# Patient Record
Sex: Female | Born: 1995
Health system: Southern US, Community
[De-identification: ages and names within clinical notes are randomized; demographics above are authoritative.]

## PROBLEM LIST (undated history)

## (undated) DIAGNOSIS — F329 Major depressive disorder, single episode, unspecified: Secondary | ICD-10-CM

## (undated) DIAGNOSIS — K219 Gastro-esophageal reflux disease without esophagitis: Secondary | ICD-10-CM

## (undated) DIAGNOSIS — F32A Depression, unspecified: Secondary | ICD-10-CM

## (undated) DIAGNOSIS — F419 Anxiety disorder, unspecified: Secondary | ICD-10-CM

## (undated) DIAGNOSIS — T7840XA Allergy, unspecified, initial encounter: Secondary | ICD-10-CM

## (undated) HISTORY — PX: WISDOM TOOTH EXTRACTION: SHX21

## (undated) HISTORY — PX: TONSILLECTOMY: SUR1361

## (undated) HISTORY — DX: Gastro-esophageal reflux disease without esophagitis: K21.9

## (undated) HISTORY — DX: Depression, unspecified: F32.A

## (undated) HISTORY — DX: Allergy, unspecified, initial encounter: T78.40XA

## (undated) HISTORY — DX: Anxiety disorder, unspecified: F41.9

## (undated) HISTORY — DX: Major depressive disorder, single episode, unspecified: F32.9

---
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
## 2015-03-12 ENCOUNTER — Emergency Department
Admission: EM | Admit: 2015-03-12 | Discharge: 2015-03-12 | Disposition: A | Discharge status: Home / Self Care | Payer: Managed Care, Other (non HMO) | Attending: Emergency Medicine | Admitting: Emergency Medicine

## 2015-03-12 ENCOUNTER — Encounter: Payer: Self-pay | Admitting: Emergency Medicine

## 2015-03-12 DIAGNOSIS — J029 Acute pharyngitis, unspecified: Secondary | ICD-10-CM | POA: Insufficient documentation

## 2015-03-12 DIAGNOSIS — Z87891 Personal history of nicotine dependence: Secondary | ICD-10-CM | POA: Insufficient documentation

## 2015-03-12 DIAGNOSIS — J040 Acute laryngitis: Secondary | ICD-10-CM | POA: Diagnosis not present

## 2015-03-12 DIAGNOSIS — Z880 Allergy status to penicillin: Secondary | ICD-10-CM | POA: Diagnosis not present

## 2015-03-12 DIAGNOSIS — B349 Viral infection, unspecified: Secondary | ICD-10-CM | POA: Diagnosis not present

## 2015-03-12 LAB — POCT RAPID STREP A
Streptococcus, Group A Screen (Direct): NEGATIVE
Streptococcus, Group A Screen (Direct): NEGATIVE

## 2015-03-12 MED ORDER — LIDOCAINE VISCOUS 2 % MT SOLN: 5.0000 mL | Freq: Four times a day (QID) | OROMUCOSAL | Status: DC | PRN

## 2015-03-12 MED ORDER — LIDOCAINE VISCOUS 2 % MT SOLN
15.0000 mL | Freq: Once | OROMUCOSAL | Status: AC
  Administered 2015-03-12: 15 mL via OROMUCOSAL
  Filled 2015-03-12: qty 15

## 2015-03-12 MED ORDER — METHYLPREDNISOLONE 4 MG PO TBPK: ORAL_TABLET | ORAL | Status: DC

## 2015-03-12 MED ORDER — PSEUDOEPH-BROMPHEN-DM 30-2-10 MG/5ML PO SYRP: 5.0000 mL | ORAL_SOLUTION | Freq: Four times a day (QID) | ORAL | Status: DC | PRN

## 2015-03-12 MED ORDER — ONDANSETRON 8 MG PO TBDP
8.0000 mg | ORAL_TABLET | Freq: Once | ORAL | Status: AC
  Administered 2015-03-12: 8 mg via ORAL
  Filled 2015-03-12: qty 1

## 2015-03-12 MED ORDER — DIPHENHYDRAMINE HCL 12.5 MG/5ML PO ELIX
25.0000 mg | ORAL_SOLUTION | Freq: Once | ORAL | Status: AC
  Administered 2015-03-12: 25 mg via ORAL
  Filled 2015-03-12: qty 10

## 2015-03-12 MED ORDER — ONDANSETRON 8 MG PO TBDP: 8.0000 mg | ORAL_TABLET | Freq: Three times a day (TID) | ORAL | Status: DC | PRN

## 2015-03-12 NOTE — ED Notes (Signed)
Sore throat with some fever for the past 3 days

## 2015-03-12 NOTE — Discharge Instructions (Signed)
Pharyngitis Pharyngitis is redness, pain, and swelling (inflammation) of your pharynx.  CAUSES  Pharyngitis is usually caused by infection. Most of the time, these infections are from viruses (viral) and are part of a cold. However, sometimes pharyngitis is caused by bacteria (bacterial). Pharyngitis can also be caused by allergies. Viral pharyngitis may be spread from person to person by coughing, sneezing, and personal items or utensils (cups, forks, spoons, toothbrushes). Bacterial pharyngitis may be spread from person to person by more intimate contact, such as kissing.  SIGNS AND SYMPTOMS  Symptoms of pharyngitis include:   Sore throat.   Tiredness (fatigue).   Low-grade fever.   Headache.  Joint pain and muscle aches.  Skin rashes.  Swollen lymph nodes.  Plaque-like film on throat or tonsils (often seen with bacterial pharyngitis). DIAGNOSIS  Your health care provider will ask you questions about your illness and your symptoms. Your medical history, along with a physical exam, is often all that is needed to diagnose pharyngitis. Sometimes, a rapid strep test is done. Other lab tests may also be done, depending on the suspected cause.  TREATMENT  Viral pharyngitis will usually get better in 3-4 days without the use of medicine. Bacterial pharyngitis is treated with medicines that kill germs (antibiotics).  HOME CARE INSTRUCTIONS   Drink enough water and fluids to keep your urine clear or pale yellow.   Only take over-the-counter or prescription medicines as directed by your health care provider:   If you are prescribed antibiotics, make sure you finish them even if you start to feel better.   Do not take aspirin.   Get lots of rest.   Gargle with 8 oz of salt water ( tsp of salt per 1 qt of water) as often as every 1-2 hours to soothe your throat.   Throat lozenges (if you are not at risk for choking) or sprays may be used to soothe your throat. SEEK MEDICAL  CARE IF:   You have large, tender lumps in your neck.  You have a rash.  You cough up green, yellow-brown, or bloody spit. SEEK IMMEDIATE MEDICAL CARE IF:   Your neck becomes stiff.  You drool or are unable to swallow liquids.  You vomit or are unable to keep medicines or liquids down.  You have severe pain that does not go away with the use of recommended medicines.  You have trouble breathing (not caused by a stuffy nose). MAKE SURE YOU:   Understand these instructions.  Will watch your condition.  Will get help right away if you are not doing well or get worse.   This information is not intended to replace advice given to you by your health care provider. Make sure you discuss any questions you have with your health care provider.   Document Released: 02/17/2005 Document Revised: 12/08/2012 Document Reviewed: 10/25/2012 Elsevier Interactive Patient Education 2016 Else[employer].  Laryngitis Laryngitis is swelling (inflammation) of your vocal cords. This causes hoarseness, coughing, loss of voice, sore throat, or a dry throat. When your vocal cords are inflamed, your voice sounds different. Laryngitis can be temporary (acute) or long-term (chronic). Most cases of acute laryngitis improve with time. Chronic laryngitis is laryngitis that lasts for more than three weeks. HOME CARE  Drink enough fluid to keep your pee (urine) clear or pale yellow.  Breathe in moist air. Use a humidifier if you live in a dry climate.  Take medicines only as told by your doctor.  Do not smoke cigarettes or  electronic cigarettes. If you need help quitting, ask your doctor.  Talk as little as possible. Also avoid whispering, which can cause vocal strain.  Write instead of talking. Do this until your voice is back to normal. GET HELP IF:  You have a fever.  Your pain is worse.  You have trouble swallowing. GET HELP RIGHT AWAY IF:  You cough up blood.  You have trouble breathing.     This information is not intended to replace advice given to you by your health care provider. Make sure you discuss any questions you have with your health care provider.   Document Released: 02/06/2011 Document Revised: 03/10/2014 Document Reviewed: 08/02/2013 Elsevier Interactive Patient Education [employer].

## 2015-03-12 NOTE — ED Provider Notes (Signed)
[facility] Emergency Department Provider Note  ____________________________________________  Time seen: Approximately 4:08 PM  I have reviewed the triage vital signs and the nursing notes.   HISTORY  Chief Complaint Fever and Sore Throat    HPI Amber Wilson is a 20 y.o. female complaining of sore throat and fever. Patient states fever started 3 days ago in the sore throat started 2 days ago. Patient state yesterday she started losing her voice. Patient also complaining of sinus congestion and postnasal drainage. Patient denies any nausea vomiting diarrhea. Patient rates the pain discomfort as 7/10. Patient stated pain increases swallowing but she can tolerate food and fluids.   History reviewed. No pertinent past medical history.  There are no active problems to display for this patient.   History reviewed. No pertinent past surgical history.  Current Outpatient Rx  Name  Route  Sig  Dispense  Refill  . brompheniramine-pseudoephedrine-DM 30-2-10 MG/5ML syrup   Oral   Take 5 mLs by mouth 4 (four) times daily as needed. Mixed with 5 mL of viscous lidocaine for swish and swallow.   120 mL   0   . lidocaine (XYLOCAINE) 2 % solution   Mouth/Throat   Use as directed 5 mLs in the mouth or throat every 6 (six) hours as needed for mouth pain. Mixed with 5 mL of Bromfed-DM for swish and swallow.   100 mL   0     Allergies Penicillins  No family history on file.  Social History Social History  Substance Use Topics  . Smoking status: Former Research scientist (life sciences)  . Smokeless tobacco: None  . Alcohol Use: No    Review of Systems Constitutional: No fever/chills Eyes: No visual changes. ENT: No sore throat. Cardiovascular: Denies chest pain. Respiratory: Denies shortness of breath. Gastrointestinal: No abdominal pain.  No nausea, no vomiting.  No diarrhea.  No constipation. Genitourinary: Negative for dysuria. Musculoskeletal: Negative for back  pain. Skin: Negative for rash. Neurological: Negative for headaches, focal weakness or numbness. Allergic/Immunilogical: Patient lists penicillin  10-point ROS otherwise negative.  ____________________________________________   PHYSICAL EXAM:  VITAL SIGNS: ED Triage Vitals  Enc Vitals Group     BP 03/12/15 1514 122/69 mmHg     Pulse Rate 03/12/15 1514 111     Resp 03/12/15 1514 18     Temp 03/12/15 1514 97.7 F (36.5 C)     Temp Source 03/12/15 1514 Oral     SpO2 03/12/15 1514 99 %     Weight 03/12/15 1514 140 lb (63.504 kg)     Height 03/12/15 1514 5\' 2"  (1.575 m)     Head Cir --      Peak Flow --      Pain Score 03/12/15 1418 7     Pain Loc --      Pain Edu? --      Excl. in [place]? --     Constitutional: Alert and oriented. Well appearing and in no acute distress. Eyes: Conjunctivae are normal. PERRL. EOMI. Head: Atraumatic. Nose: No congestion/rhinnorhea. Mouth/Throat: Mucous membranes are moist.  Oropharynx erythematous without exudative tonsils. Neck: No stridor.  No cervical spine tenderness to palpation. Hematological/Lymphatic/Immunilogical: No cervical lymphadenopathy. Cardiovascular: Normal rate, regular rhythm. Grossly normal heart sounds.  Good peripheral circulation. Respiratory: Normal respiratory effort.  No retractions. Lungs CTAB. Gastrointestinal: Soft and nontender. No distention. No abdominal bruits. No CVA tenderness. Musculoskeletal: No lower extremity tenderness nor edema.  No joint effusions. Neurologic:  Normal speech and language. No gross  focal neurologic deficits are appreciated. No gait instability. Skin:  Skin is warm, dry and intact. No rash noted. Psychiatric: Mood and affect are normal. Speech and behavior are normal.  ____________________________________________   LABS (all labs ordered are listed, but only abnormal results are displayed)  Labs Reviewed  POCT RAPID STREP A    ____________________________________________  EKG   ____________________________________________  RADIOLOGY   ____________________________________________   PROCEDURES  Procedure(s) performed: None  Critical Care performed: No  ____________________________________________   INITIAL IMPRESSION / ASSESSMENT AND PLAN / ED COURSE  Pertinent labs & imaging results that were available during my care of the patient were reviewed by me and considered in my medical decision making (see chart for details).  Acute pharyngitis and laryngitis. Patient given discharge care instructions. Patient given prescription viscous lidocaine) felt DM. Advised to follow-up with "clinic if condition persists. ____________________________________________   FINAL CLINICAL IMPRESSION(S) / ED DIAGNOSES  Final diagnoses:  Pharyngitis with viral syndrome  Laryngitis, acute      Sable Feil, PA-C 03/12/15 1621

## 2015-03-12 NOTE — ED Notes (Signed)
Pt to ed with c/o sore throat and fever x 3 days.

## 2016-08-29 ENCOUNTER — Ambulatory Visit: Payer: Self-pay | Admitting: Advanced Practice Midwife

## 2016-09-19 ENCOUNTER — Encounter: Payer: Self-pay | Admitting: Advanced Practice Midwife

## 2016-09-19 ENCOUNTER — Ambulatory Visit (INDEPENDENT_AMBULATORY_CARE_PROVIDER_SITE_OTHER): Payer: Commercial Managed Care - PPO | Admitting: Advanced Practice Midwife

## 2016-09-19 VITALS — BP 114/70 | Ht 63.0 in | Wt 127.0 lb

## 2016-09-19 DIAGNOSIS — Z30011 Encounter for initial prescription of contraceptive pills: Secondary | ICD-10-CM

## 2016-09-19 DIAGNOSIS — Z124 Encounter for screening for malignant neoplasm of cervix: Secondary | ICD-10-CM | POA: Diagnosis not present

## 2016-09-19 DIAGNOSIS — Z01419 Encounter for gynecological examination (general) (routine) without abnormal findings: Secondary | ICD-10-CM | POA: Diagnosis not present

## 2016-09-19 LAB — HM PAP SMEAR: HM Pap smear: NEGATIVE

## 2016-09-19 MED ORDER — NORETHIN ACE-ETH ESTRAD-FE 1-20 MG-MCG PO TABS: 1.0000 | ORAL_TABLET | Freq: Every day | ORAL | 11 refills | Status: DC

## 2016-09-19 NOTE — Progress Notes (Signed)
Patient ID: Amber Wilson, female   DOB: 31-Oct-1995, 21 y.o.   MRN: [id number]     Gynecology Annual Exam  PCP: Patient, No Pcp Per  Chief Complaint:  Chief Complaint  Patient presents with  . Annual Exam    History of Present Illness: Patient is a 21 y.o. G0P0000 presents for annual exam. The patient has complaints today of being overdue for Nexplanon removal and she is requesting pills for birth control. She has a mole on her left breast that was previously examined by Dr Dear of [facility] in 2013. Dr Dear described the mole as 34mm, dark brown and evenly pigmented.   LMP: 08/25/2016 Menarche:not applicable Average Interval: has had monthly light bleeding for the last few months Duration of flow: 5 days Heavy Menses: no Clots: no Intermenstrual Bleeding: no Postcoital Bleeding: no Dysmenorrhea: no  The patient is sexually active. She currently uses Nexplanon for contraception. She denies dyspareunia.  The patient does perform self breast exams.  There is no notable family history of breast or ovarian cancer in her family.  The patient wears seatbelts: yes.  The patient has regular exercise: yes.    The patient denies current symptoms of depression.    Review of Systems: Review of Systems  Constitutional: Negative.   HENT: Negative.   Eyes: Negative.   Respiratory: Negative.   Cardiovascular: Negative.        Patient admits cystic breast tissue. Possible color change in mole on left breast  Gastrointestinal: Negative.   Genitourinary: Negative.   Musculoskeletal: Negative.   Skin: Negative.   Neurological: Negative.   Endo/Heme/Allergies: Negative.   Psychiatric/Behavioral: Negative.     Past Medical History:  Past Medical History:  Diagnosis Date  . Depression     Past Surgical History:  Past Surgical History:  Procedure Laterality Date  . TONSILLECTOMY    . WISDOM TOOTH EXTRACTION      Gynecologic History:  No LMP recorded. Contraception:  Nexplanon Last Pap: Results were: has never had a PAP    Obstetric History: G0P0000  Family History:  History reviewed. No pertinent family history.  Social History:  Social History   Social History  . Marital status: Single    Spouse name: N/A  . Number of children: N/A  . Years of education: N/A   Occupational History  . Not on file.   Social History Main Topics  . Smoking status: Former Research scientist (life sciences)  . Smokeless tobacco: Never Used  . Alcohol use No  . Drug use: No  . Sexual activity: Yes    Birth control/ protection: Implant   Other Topics Concern  . Not on file   Social History Narrative  . No narrative on file    Allergies:  Allergies  Allergen Reactions  . Penicillins     Medications: Prior to Admission medications   Medication Sig Start Date End Date Taking? Authorizing Provider  etonogestrel (NEXPLANON) 68 MG IMPL implant 1 each by Subdermal route once.   Yes [provider]  brompheniramine-pseudoephedrine-DM 30-2-10 MG/5ML syrup Take 5 mLs by mouth 4 (four) times daily as needed. Mixed with 5 mL of viscous lidocaine for swish and swallow. Patient not taking: Reported on 09/19/2016 03/12/15   Sable Feil, PA-C  lidocaine (XYLOCAINE) 2 % solution Use as directed 5 mLs in the mouth or throat every 6 (six) hours as needed for mouth pain. Mixed with 5 mL of Bromfed-DM for swish and swallow. Patient not taking: Reported on 09/19/2016 03/12/15  Sable Feil, PA-C  methylPREDNISolone (MEDROL DOSEPAK) 4 MG TBPK tablet Take Tapered dose as directed Patient not taking: Reported on 09/19/2016 03/12/15   Sable Feil, PA-C  ondansetron (ZOFRAN ODT) 8 MG disintegrating tablet Take 1 tablet (8 mg total) by mouth every 8 (eight) hours as needed for nausea or vomiting. Patient not taking: Reported on 09/19/2016 03/12/15   Sable Feil, PA-C    Physical Exam Vitals: Blood pressure 114/70, height 5\' 3"  (1.6 m), weight 127 lb (57.6 kg).  General: NAD HEENT:  normocephalic, anicteric Thyroid: no enlargement, no palpable nodules Pulmonary: No increased work of breathing, CTAB Cardiovascular: RRR, distal pulses 2+ Breast: Breast symmetrical, no tenderness, 1 cm cyst palpated in each breast, no skin or nipple retraction present, no nipple discharge.  No axillary or supraclavicular lymphadenopathy. Nevus on left breast at 10 o'clock next to areola with irregular border is slightly longer than wide approximately 5 mm x 7 mm. The exterior is light brown and the interior a darker brown. Abdomen: NABS, soft, non-tender, non-distended.  Umbilicus without lesions.  No hepatomegaly, splenomegaly or masses palpable. No evidence of hernia  Genitourinary:  External: Normal external female genitalia.  Normal urethral meatus, normal  Bartholin's and Skene's glands.    Vagina: Normal vaginal mucosa, no evidence of prolapse.    Cervix: Grossly normal in appearance, no bleeding, no CMT  Uterus: Non-enlarged, mobile, normal contour.    Adnexa: ovaries non-enlarged, no adnexal masses  Rectal: deferred  Lymphatic: no evidence of inguinal lymphadenopathy Extremities: no edema, erythema, or tenderness Neurologic: Grossly intact Psychiatric: mood appropriate, affect full   Assessment: 21 y.o. G0P0000 Well Woman exam with PAP smear  Plan: Problem List Items Addressed This Visit    None    Visit Diagnoses    Well woman exam with routine gynecological exam    -  Primary   Relevant Orders   IGP,rfx Aptima HPV all pth   Cervical cancer screening       Relevant Orders   IGP,rfx Aptima HPV all pth      1) 4) Gardasil Series discussed and if applicable offered to patient - Patient has not previously completed 3 shot series   2) STI screening was offered and declined  3) ASCCP guidelines and rational discussed.  Patient opts for yearly screening interval  4) Contraception - Removal of Nexplanon today and initiate OCP  5) Follow up 1 year for routine annual  exam  Rod Can, CNM      GYNECOLOGY PROCEDURE NOTE  Nexplanon removal discussed in detail.  Risks of infection, bleeding, nerve injury all reviewed.  Patient understands risks and desires to proceed.  Verbal consent obtained.  Patient is certain she wants the implanon removed.  All questions answered.  Procedure: Patient placed in dorsal supine with left arm above head, elbow flexed at 90 degrees, arm resting on examination table.  Nexplanon identified without problems.  Betadine scrub x3.  1 ml of 1% lidocaine injected under Nexplanon device without problems.  Sterile gloves applied.  Small 0.5cm incision made at distal tip of Nexplanon device with 11 blade scalpel.  Nexplanon brought to incision and grasped with a small kelly clamp.  Nexplanon removed intact without problems.  Pressure applied to incision.  Hemostasis obtained.  Steri-strips applied, followed by bandage and compression dressing.  Patient tolerated procedure well.  No complications.   Assessment: 21 y.o. year old female now s/p uncomplicated Nexplanon removal.  Plan: 1.  Patient given post procedure precautions  and asked to call for fever, chills, redness or drainage from her incision, bleeding from incision.  She understands she will likely have a small bruise near site of removal and can remove bandage tomorrow and steri-strips in approximately 1 week.  2) Contraception: OCPs  Rod Can, CNM

## 2016-09-23 LAB — IGP,RFX APTIMA HPV ALL PTH: PAP Smear Comment: 0

## 2017-08-19 ENCOUNTER — Other Ambulatory Visit: Payer: Self-pay | Admitting: Advanced Practice Midwife

## 2017-08-19 DIAGNOSIS — Z30011 Encounter for initial prescription of contraceptive pills: Secondary | ICD-10-CM

## 2017-10-10 ENCOUNTER — Other Ambulatory Visit: Payer: Self-pay | Admitting: Advanced Practice Midwife

## 2017-10-10 DIAGNOSIS — Z30011 Encounter for initial prescription of contraceptive pills: Secondary | ICD-10-CM

## 2017-10-12 ENCOUNTER — Other Ambulatory Visit: Payer: Self-pay

## 2017-10-12 DIAGNOSIS — Z30011 Encounter for initial prescription of contraceptive pills: Secondary | ICD-10-CM

## 2017-10-12 MED ORDER — NORETHIN ACE-ETH ESTRAD-FE 1-20 MG-MCG PO TABS: 1.0000 | ORAL_TABLET | Freq: Every day | ORAL | 0 refills | Status: DC

## 2017-10-12 NOTE — Telephone Encounter (Signed)
Pt has annual scheduled for 8/30; needs refill of bc sent to [facility].  [phone]  Pt aware eRx'd.

## 2017-10-30 ENCOUNTER — Ambulatory Visit (INDEPENDENT_AMBULATORY_CARE_PROVIDER_SITE_OTHER): Payer: Commercial Managed Care - PPO | Admitting: Advanced Practice Midwife

## 2017-10-30 ENCOUNTER — Encounter

## 2017-10-30 ENCOUNTER — Encounter: Payer: Self-pay | Admitting: Advanced Practice Midwife

## 2017-10-30 VITALS — BP 102/74 | HR 53 | Ht 63.0 in | Wt 127.0 lb

## 2017-10-30 DIAGNOSIS — Z0000 Encounter for general adult medical examination without abnormal findings: Secondary | ICD-10-CM

## 2017-10-30 DIAGNOSIS — Z30011 Encounter for initial prescription of contraceptive pills: Secondary | ICD-10-CM | POA: Diagnosis not present

## 2017-10-30 MED ORDER — NORETHIN ACE-ETH ESTRAD-FE 1-20 MG-MCG PO TABS: 1.0000 | ORAL_TABLET | Freq: Every day | ORAL | 11 refills | Status: DC

## 2017-10-30 NOTE — Patient Instructions (Signed)

## 2017-10-30 NOTE — Progress Notes (Signed)
Patient ID: Amber Wilson, female   DOB: Nov 10, 1995, 22 y.o.   MRN: [id number]     Gynecology Annual Exam  PCP: Patient, No Pcp Per  Chief Complaint:  Chief Complaint  Patient presents with  . Annual Exam    last pap 08/2016    History of Present Illness: Patient is a 22 y.o. G0P0000 presents for annual exam. The patient has complaint today of recent left breast tenderness. She feels a possible cyst in the tender area. She also wonders if the mole on her left breast has changed from previous visit.    LMP: Patient's last menstrual period was 10/11/2017. Menarche:not applicable Average Interval: regular, 28 days Duration of flow: 5 days Heavy Menses: no Clots: no Intermenstrual Bleeding: no Postcoital Bleeding: no Dysmenorrhea: no  The patient is sexually active. She currently uses OCP (estrogen/progesterone) for contraception. She denies dyspareunia.  The patient does perform self breast exams.  There is no notable family history of breast or ovarian cancer in her family.  The patient wears seatbelts: yes.  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    The patient denies current symptoms of depression.    Review of Systems: Review of Systems  Constitutional: Negative.   HENT: Negative.   Eyes: Negative.   Respiratory: Negative.   Cardiovascular:       Breast tenderness  Gastrointestinal: Negative.   Genitourinary: Negative.   Musculoskeletal: Negative.   Skin: Negative.   Neurological: Negative.   Endo/Heme/Allergies:       Seasonal allergies  Psychiatric/Behavioral:       Anxiety    Past Medical History:  Past Medical History:  Diagnosis Date  . Depression     Past Surgical History:  Past Surgical History:  Procedure Laterality Date  . TONSILLECTOMY    . WISDOM TOOTH EXTRACTION      Gynecologic History:  Patient's last menstrual period  was 10/11/2017. Contraception: OCP (estrogen/progesterone) Last Pap: 1 year ago Results were:  no abnormalities   Obstetric History: G0P0000  Family History:  No family history on file.  Social History:  Social History   Socioeconomic History  . Marital status: Single    Spouse name: Not on file  . Number of children: Not on file  . Years of education: Not on file  . Highest education level: Not on file  Occupational History  . Not on file  Social Needs  . Financial resource strain: Not on file  . Food insecurity:    Worry: Not on file    Inability: Not on file  . Transportation needs:    Medical: Not on file    Non-medical: Not on file  Tobacco Use  . Smoking status: Former Research scientist (life sciences)  . Smokeless tobacco: Never Used  Substance and Sexual Activity  . Alcohol use: No  . Drug use: No  . Sexual activity: Yes    Birth control/protection: Implant  Lifestyle  . Physical activity:    Days per week: Not on file    Minutes per session: Not on file  . Stress: Not on file  Relationships  . Social connections:    Talks on phone: Not on file    Gets together: Not on file    Attends religious service: Not on file    Active member of club or organization: Not on file    Attends meetings of clubs  or organizations: Not on file    Relationship status: Not on file  . Intimate partner violence:    Fear of current or ex partner: Not on file    Emotionally abused: Not on file    Physically abused: Not on file    Forced sexual activity: Not on file  Other Topics Concern  . Not on file  Social History Narrative  . Not on file    Allergies:  Allergies  Allergen Reactions  . Penicillins     Medications: Prior to Admission medications   Medication Sig Start Date End Date Taking? Authorizing Provider  norethindrone-ethinyl estradiol (JUNEL FE 1/20) 1-20 MG-MCG tablet Take 1 tablet by mouth daily. 10/30/17  Yes Rod Can, CNM    Physical Exam Vitals: Blood pressure  102/74, pulse (!) 53, height 5\' 3"  (1.6 m), weight 127 lb (57.6 kg), last menstrual period 10/11/2017, SpO2 95 %.  General: NAD HEENT: normocephalic, anicteric Thyroid: no enlargement, no palpable nodules Pulmonary: No increased work of breathing, CTAB Cardiovascular: RRR, distal pulses 2+ Breast: Breast symmetrical, no tenderness of masses palpated on right breast, tenderness on outer quadrant of left breast with palpated nodule at 3 o'clock which feels like a cyst, no skin or nipple retraction present, no nipple discharge.  No axillary or supraclavicular lymphadenopathy. Mole on left breast continues to be 5 mm and dark brown in color with slightly irregular border especially at superior edge Abdomen: NABS, soft, non-tender, non-distended.  Umbilicus without lesions.  No hepatomegaly, splenomegaly or masses palpable. No evidence of hernia  Genitourinary: deferred for no concerns/PAP interval/shared decision making Extremities: no edema, erythema, or tenderness Neurologic: Grossly intact Psychiatric: mood appropriate, affect full   Assessment: 22 y.o. G0P0000 routine annual exam  Plan: Problem List Items Addressed This Visit    None    Visit Diagnoses    Well woman exam without gynecological exam    -  Primary   Encounter for initial prescription of contraceptive pills       Relevant Medications   norethindrone-ethinyl estradiol (JUNEL FE 1/20) 1-20 MG-MCG tablet      1) 4) Gardasil Series discussed and if applicable offered to patient - Patient has not previously completed 3 shot series   2) STI screening  was offered and declined   3) Recommend visit to dermatologist for evaluation of mole out of abundance of caution  4) Offered ultrasound of left breast to evaluate "cystic" area. She prefers to wait a month and see if cyst/tenderness goes away and she will let me know if she wants ultrasound  5)  ASCCP guidelines and rational discussed.  Patient opts for every 3 years  screening interval  6) Contraception - the patient is currently using  OCP (estrogen/progesterone).  She is happy with her current form of contraception and plans to continue We discussed safe sex practices to reduce her furture risk of STI's.    7) Return in 1 year (on 10/31/2018).    Rod Can, [facility], [facility] 10/30/2017, 4:17 PM

## 2018-03-09 DIAGNOSIS — Z111 Encounter for screening for respiratory tuberculosis: Secondary | ICD-10-CM | POA: Diagnosis not present

## 2018-03-11 DIAGNOSIS — Z111 Encounter for screening for respiratory tuberculosis: Secondary | ICD-10-CM | POA: Diagnosis not present

## 2018-05-05 ENCOUNTER — Other Ambulatory Visit: Payer: Self-pay

## 2018-05-05 ENCOUNTER — Ambulatory Visit
Admission: EM | Admit: 2018-05-05 | Discharge: 2018-05-05 | Disposition: A | Discharge status: Home / Self Care | Payer: Commercial Managed Care - PPO | Attending: Family Medicine | Admitting: Family Medicine

## 2018-05-05 DIAGNOSIS — J029 Acute pharyngitis, unspecified: Secondary | ICD-10-CM | POA: Diagnosis not present

## 2018-05-05 LAB — RAPID STREP SCREEN (MED CTR MEBANE ONLY): Streptococcus, Group A Screen (Direct): NEGATIVE

## 2018-05-05 NOTE — ED Triage Notes (Signed)
Patient complains of sore throat that started yesterday with swollen lymph nodes and painful swallowing.

## 2018-05-05 NOTE — ED Provider Notes (Signed)
[facility] ____________________________________________  Time seen: Approximately 12:22 PM  I have reviewed the triage vital signs and the nursing notes.   HISTORY  Chief Complaint Sore Throat   HPI Amber Wilson is a 23 y.o. female presenting for evaluation of sore throat since yesterday.  States sore throat is mild to moderate.  States painful to swallow.  Denies nasal congestion or nasal drainage.  Some coughing, and clearing throat.  Denies fevers.  States does feel like previous strep throat.  Denies known fevers.  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  Denies chest pain, shortness of breath or abdominal pain.  States that she also a few days ago ate a hot panini and thinks that she cut underneath her tongue and wanted to be checked.  States the cough drops over-the-counter helping some.  Denies other relieving factors.  Reports otherwise doing well.   Past Medical History:  Diagnosis Date  . Depression     There are no active problems to display for this patient.   Past Surgical History:  Procedure Laterality Date  . TONSILLECTOMY    . WISDOM TOOTH EXTRACTION       No current facility-administered medications for this encounter.   Current Outpatient Medications:  .  norethindrone-ethinyl estradiol (JUNEL FE 1/20) 1-20 MG-MCG tablet, Take 1 tablet by mouth daily., Disp: 28 tablet, Rfl: 11  Allergies Penicillins  History reviewed. No pertinent family history.  Social History Social History   Tobacco Use  . Smoking status: Former Research scientist (life sciences)  . Smokeless tobacco: Never Used  Substance Use Topics  . Alcohol use: No  . Drug use: No    Review of Systems Constitutional: No fever ENT: positive sore throat. Cardiovascular: Denies chest pain. Respiratory: Denies shortness of breath. Gastrointestinal: No abdominal pain.   Musculoskeletal: Negative for back pain. Skin: Negative for  rash.   ____________________________________________   PHYSICAL EXAM:  VITAL SIGNS: ED Triage Vitals  Enc Vitals Group     BP 05/05/18 1140 122/79     Pulse Rate 05/05/18 1140 88     Resp 05/05/18 1140 16     Temp 05/05/18 1140 98.8 F (37.1 C)     Temp Source 05/05/18 1140 Oral     SpO2 05/05/18 1140 99 %     Weight 05/05/18 1135 125 lb (56.7 kg)     Height 05/05/18 1135 5\' 3"  (1.6 m)     Head Circumference --      Peak Flow --      Pain Score 05/05/18 1134 7     Pain Loc --      Pain Edu? --      Excl. in [place]? --     Constitutional: Alert and oriented. Well appearing and in no acute distress. Eyes: Conjunctivae are normal.  Head: Atraumatic. No sinus tenderness to palpation. No swelling. No erythema.  Ears: no erythema, normal TMs bilaterally.   Nose:No nasal congestion   Mouth/Throat: Mucous membranes are moist. Mild pharyngeal erythema.  Previous tonsillectomy.  No exudate.  Underneath left side of tongue less than 0.5 cm aphthous appearing ulcer, no surrounding erythema. Neck: No stridor.  No cervical spine tenderness to palpation. Hematological/Lymphatic/Immunilogical: No cervical lymphadenopathy. Cardiovascular: Normal rate, regular rhythm. Grossly normal heart sounds.  Good peripheral circulation. Respiratory: Normal respiratory effort.  No retractions. No wheezes, rales or rhonchi. Good air movement.  Musculoskeletal: Ambulatory with steady gait.  Neurologic:  Normal speech and language. No gait instability. Skin:  Skin  appears warm, dry and intact. No rash noted. Psychiatric: Mood and affect are normal. Speech and behavior are normal. ___________________________________________   LABS (all labs ordered are listed, but only abnormal results are displayed)  Labs Reviewed  RAPID STREP SCREEN ([facility] ONLY)  CULTURE, GROUP A STREP [facility])    PROCEDURES   INITIAL IMPRESSION / ASSESSMENT AND PLAN / ED COURSE  Pertinent labs & imaging results that were  available during my care of the patient were reviewed by me and considered in my medical decision making (see chart for details).  Well-appearing patient.  No acute distress.  Strep negative, will culture.  Suspect viral pharyngitis.  Patient declined flu evaluation.  Aphthous ulcer appearance, encourage supportive care and monitoring.  Work note given for today and tomorrow as well as school note for today and tomorrow.  Continue over-the-counter lozenges and supportive care.  Discussed follow up with Primary care physician this week. Discussed follow up and return parameters including no resolution or any worsening concerns. Patient verbalized understanding and agreed to plan.   ____________________________________________   FINAL CLINICAL IMPRESSION(S) / ED DIAGNOSES  Final diagnoses:  Pharyngitis, unspecified etiology     ED Discharge Orders    None       Note: This dictation was prepared with Dragon dictation along with smaller phrase technology. Any transcriptional errors that result from this process are unintentional.         Marylene Land, NP 05/05/18 1226

## 2018-05-05 NOTE — Discharge Instructions (Addendum)
Over the counter as discussed. Rest. Drink plenty of fluids.   Follow up with your primary care physician this week as needed. Return to Urgent care for new or worsening concerns.

## 2018-05-07 ENCOUNTER — Ambulatory Visit
Admission: EM | Admit: 2018-05-07 | Discharge: 2018-05-07 | Disposition: A | Discharge status: Home / Self Care | Payer: Commercial Managed Care - PPO | Attending: Family Medicine | Admitting: Family Medicine

## 2018-05-07 ENCOUNTER — Encounter: Payer: Self-pay | Admitting: Emergency Medicine

## 2018-05-07 ENCOUNTER — Other Ambulatory Visit: Payer: Self-pay

## 2018-05-07 DIAGNOSIS — R5383 Other fatigue: Secondary | ICD-10-CM

## 2018-05-07 DIAGNOSIS — R05 Cough: Secondary | ICD-10-CM | POA: Diagnosis not present

## 2018-05-07 DIAGNOSIS — M791 Myalgia, unspecified site: Secondary | ICD-10-CM

## 2018-05-07 DIAGNOSIS — Z87891 Personal history of nicotine dependence: Secondary | ICD-10-CM

## 2018-05-07 DIAGNOSIS — R0981 Nasal congestion: Secondary | ICD-10-CM | POA: Diagnosis not present

## 2018-05-07 DIAGNOSIS — J111 Influenza due to unidentified influenza virus with other respiratory manifestations: Secondary | ICD-10-CM

## 2018-05-07 DIAGNOSIS — R0982 Postnasal drip: Secondary | ICD-10-CM

## 2018-05-07 DIAGNOSIS — R69 Illness, unspecified: Secondary | ICD-10-CM

## 2018-05-07 LAB — CULTURE, GROUP A STREP (THRC)

## 2018-05-07 MED ORDER — OSELTAMIVIR PHOSPHATE 75 MG PO CAPS: 75.0000 mg | ORAL_CAPSULE | Freq: Two times a day (BID) | ORAL | 0 refills | Status: DC

## 2018-05-07 MED ORDER — ONDANSETRON 8 MG PO TBDP: 8.0000 mg | ORAL_TABLET | Freq: Three times a day (TID) | ORAL | 0 refills | Status: DC | PRN

## 2018-05-07 NOTE — ED Provider Notes (Signed)
[facility]    CSN: [id number] Arrival date & time: 05/07/18  1540     History   Chief Complaint Chief Complaint  Patient presents with  . Sinus Problem  . Cough  . Fever    HPI Amber Wilson is a 23 y.o. female.   The history is provided by the patient.  Sinus Problem   Cough  Associated symptoms: fever, myalgias and rhinorrhea   Associated symptoms: no wheezing   Fever  Associated symptoms: congestion, cough, myalgias and rhinorrhea   URI  Presenting symptoms: congestion, cough, fatigue, fever and rhinorrhea   Severity:  Moderate Onset quality:  Sudden Duration:  2 days Timing:  Constant Progression:  Worsening Chronicity:  New Relieved by:  Nothing Ineffective treatments:  OTC medications Associated symptoms: myalgias   Associated symptoms: no wheezing   Risk factors: sick contacts     Past Medical History:  Diagnosis Date  . Depression     There are no active problems to display for this patient.   Past Surgical History:  Procedure Laterality Date  . TONSILLECTOMY    . WISDOM TOOTH EXTRACTION      OB History    Gravida  0   Para  0   Term  0   Preterm  0   AB  0   Living  0     SAB  0   TAB  0   Ectopic  0   Multiple  0   Live Births  0            Home Medications    Prior to Admission medications   Medication Sig Start Date End Date Taking? Authorizing Provider  norethindrone-ethinyl estradiol (JUNEL FE 1/20) 1-20 MG-MCG tablet Take 1 tablet by mouth daily. 10/30/17  Yes Rod Can, CNM  ondansetron (ZOFRAN ODT) 8 MG disintegrating tablet Take 1 tablet (8 mg total) by mouth every 8 (eight) hours as needed. 05/07/18   Norval Gable, MD  oseltamivir (TAMIFLU) 75 MG capsule Take 1 capsule (75 mg total) by mouth 2 (two) times daily. 05/07/18   Norval Gable, MD    Family History History reviewed. No pertinent family history.  Social History Social History   Tobacco Use  . Smoking status: Former  Research scientist (life sciences)  . Smokeless tobacco: Never Used  Substance Use Topics  . Alcohol use: No  . Drug use: No     Allergies   Penicillins   Review of Systems Review of Systems  Constitutional: Positive for fatigue and fever.  HENT: Positive for congestion and rhinorrhea.   Respiratory: Positive for cough. Negative for wheezing.   Musculoskeletal: Positive for myalgias.     Physical Exam Triage Vital Signs ED Triage Vitals  Enc Vitals Group     BP 05/07/18 1556 106/73     Pulse Rate 05/07/18 1556 (!) 112     Resp 05/07/18 1556 14     Temp 05/07/18 1556 98.8 F (37.1 C)     Temp Source 05/07/18 1556 Oral     SpO2 05/07/18 1556 99 %     Weight 05/07/18 1554 125 lb (56.7 kg)     Height 05/07/18 1554 5\' 3"  (1.6 m)     Head Circumference --      Peak Flow --      Pain Score 05/07/18 1554 6     Pain Loc --      Pain Edu? --      Excl. in [place]? --  No data found.  Updated Vital Signs BP 106/73 (BP Location: Left Arm)   Pulse (!) 112   Temp 98.8 F (37.1 C) (Oral)   Resp 14   Ht 5\' 3"  (1.6 m)   Wt 56.7 kg   LMP 04/27/2018   SpO2 99%   BMI 22.14 kg/m   Visual Acuity Right Eye Distance:   Left Eye Distance:   Bilateral Distance:    Right Eye Near:   Left Eye Near:    Bilateral Near:     Physical Exam Vitals signs and nursing note reviewed.  Constitutional:      General: She is not in acute distress.    Appearance: She is well-developed. She is not toxic-appearing or diaphoretic.  HENT:     Head: Normocephalic and atraumatic.     Right Ear: Tympanic membrane, ear canal and external ear normal.     Left Ear: Tympanic membrane, ear canal and external ear normal.     Nose: Mucosal edema and rhinorrhea present.     Mouth/Throat:     Pharynx: Uvula midline. No oropharyngeal exudate.  Neck:     Musculoskeletal: Normal range of motion and neck supple.     Thyroid: No thyromegaly.  Cardiovascular:     Rate and Rhythm: Normal rate and regular rhythm.     Heart sounds:  Normal heart sounds.  Pulmonary:     Effort: Pulmonary effort is normal. No respiratory distress.     Breath sounds: Normal breath sounds. No stridor. No wheezing, rhonchi or rales.  Lymphadenopathy:     Cervical: No cervical adenopathy.  Neurological:     Mental Status: She is alert.      UC Treatments / Results  Labs (all labs ordered are listed, but only abnormal results are displayed) Labs Reviewed - No data to display  EKG None  Radiology No results found.  Procedures Procedures (including critical care time)  Medications Ordered in UC Medications - No data to display  Initial Impression / Assessment and Plan / UC Course  I have reviewed the triage vital signs and the nursing notes.  Pertinent labs & imaging results that were available during my care of the patient were reviewed by me and considered in my medical decision making (see chart for details).      Final Clinical Impressions(s) / UC Diagnoses   Final diagnoses:  Influenza-like illness    ED Prescriptions    Medication Sig Dispense Auth. Provider   oseltamivir (TAMIFLU) 75 MG capsule Take 1 capsule (75 mg total) by mouth 2 (two) times daily. 10 capsule Norval Gable, MD   ondansetron (ZOFRAN ODT) 8 MG disintegrating tablet Take 1 tablet (8 mg total) by mouth every 8 (eight) hours as needed. 6 tablet Norval Gable, MD     1. diagnosis reviewed with patient 2. rx as per orders above; reviewed possible side effects, interactions, risks and benefits  3. Recommend supportive treatment with rest, fluids, otc analgesics prn 4. Follow-up prn if symptoms worsen or don't improve   Controlled Substance Prescriptions [place] Controlled Substance Registry consulted? Not Applicable   Norval Gable, MD 05/07/18 [phone]

## 2018-05-07 NOTE — ED Triage Notes (Signed)
Patient states that she was seen here on Wed for sore throat and states her strep test was negative.  Patient states that now she is having sinus congestion and pressure, HAs, and cough.  Patient also reports fever that started last night.

## 2018-07-09 ENCOUNTER — Ambulatory Visit: Payer: Self-pay | Admitting: Physician Assistant

## 2018-07-15 ENCOUNTER — Other Ambulatory Visit: Payer: Self-pay

## 2018-07-15 ENCOUNTER — Ambulatory Visit: Payer: BLUE CROSS/BLUE SHIELD | Admitting: Physician Assistant

## 2018-07-15 ENCOUNTER — Encounter: Payer: Self-pay | Admitting: Physician Assistant

## 2018-07-15 VITALS — BP 118/79 | HR 71 | Temp 98.7°F | Ht 63.0 in | Wt 123.6 lb

## 2018-07-15 DIAGNOSIS — Z0000 Encounter for general adult medical examination without abnormal findings: Secondary | ICD-10-CM | POA: Diagnosis not present

## 2018-07-15 DIAGNOSIS — R110 Nausea: Secondary | ICD-10-CM

## 2018-07-15 DIAGNOSIS — F419 Anxiety disorder, unspecified: Secondary | ICD-10-CM

## 2018-07-15 DIAGNOSIS — Z23 Encounter for immunization: Secondary | ICD-10-CM | POA: Diagnosis not present

## 2018-07-15 MED ORDER — ONDANSETRON HCL 4 MG PO TABS: 4.0000 mg | ORAL_TABLET | Freq: Three times a day (TID) | ORAL | 0 refills | Status: DC | PRN

## 2018-07-15 MED ORDER — HYDROXYZINE HCL 10 MG PO TABS: 10.0000 mg | ORAL_TABLET | Freq: Every day | ORAL | 0 refills | Status: DC | PRN

## 2018-07-15 MED ORDER — BUSPIRONE HCL 7.5 MG PO TABS: 7.5000 mg | ORAL_TABLET | Freq: Two times a day (BID) | ORAL | 0 refills | Status: DC

## 2018-07-15 NOTE — Progress Notes (Signed)
Patient: Amber Wilson, Female    DOB: 12-Aug-1995, 23 y.o.   MRN: [id number] Visit Date: 07/15/2018  Today's Provider: Trinna Post, PA-C   Chief Complaint  Patient presents with  . New Patient (Initial Visit)   Subjective:    New Patient Appointment Amber Wilson is a 23 y.o. female who presents today for new patient appointment. She feels well. She reports exercising includes yoga. She reports she is sleeping fairly well.  She is about to complete school for early childhood education. She lives with her boyfriend. 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 She does not have children of her own currently. Used to be seen at [facility].  PAP: 09/19/2016 normal  Currently taking OCP without issue, no history of heart attack, blood clot, or stroke.   Anxiety: Reports longstanding history of anxiety since childhood. The janitor would have to walk her into school every morning, she would become very uncomfortable in instances where she would have to be away from her mom. She reports history of being bullied in school. She reports experiencing feeling somewhat dissatisfied with her job and conflicting feelings related to her degree. She reports she has trouble sleeping and also will feel nauseated and have diarrhea. She has tried Paxil in the past for about one month and then discontinued, did not feel it helped her when she took it.   She is due for a tetanus shot today.   -----------------------------------------------------------------   Review of Systems  Constitutional: Negative.   HENT: Positive for sinus pressure.   Eyes: Negative.   Respiratory: Negative.   Cardiovascular: Negative.   Gastrointestinal: Positive for abdominal pain and nausea.  Endocrine: Negative.   Genitourinary: Negative.   Musculoskeletal: Negative.   Skin: Negative.   Allergic/Immunologic: Negative.   Neurological: Positive for headaches.  Hematological: Negative.     Psychiatric/Behavioral: The patient is nervous/anxious.     Social History She  reports that she has quit smoking. She has never used smokeless tobacco. She reports current alcohol use. She reports that she does not use drugs. Social History   Socioeconomic History  . Marital status: Single    Spouse name: Not on file  . Number of children: Not on file  . Years of education: Not on file  . Highest education level: Not on file  Occupational History  . Not on file  Social Needs  . Financial resource strain: Not on file  . Food insecurity:    Worry: Not on file    Inability: Not on file  . Transportation needs:    Medical: Not on file    Non-medical: Not on file  Tobacco Use  . Smoking status: Former Research scientist (life sciences)  . Smokeless tobacco: Never Used  Substance and Sexual Activity  . Alcohol use: Yes  . Drug use: No  . Sexual activity: Yes    Birth control/protection: Pill  Lifestyle  . Physical activity:    Days per week: Not on file    Minutes per session: Not on file  . Stress: Not on file  Relationships  . Social connections:    Talks on phone: Not on file    Gets together: Not on file    Attends religious service: Not on file    Active member of club or organization: Not on file    Attends meetings of clubs or organizations: Not on file    Relationship status: Not on file  Other Topics Concern  . Not on  file  Social History Narrative  . Not on file    There are no active problems to display for this patient.   Past Surgical History:  Procedure Laterality Date  . TONSILLECTOMY    . WISDOM TOOTH EXTRACTION      Family History  Family Status  Relation Name Status  . Mother  Alive  . Father  Alive   Her family history is not on file.     Allergies  Allergen Reactions  . Penicillins     Previous Medications   NORETHINDRONE-ETHINYL ESTRADIOL (JUNEL FE 1/20) 1-20 MG-MCG TABLET    Take 1 tablet by mouth daily.   ONDANSETRON (ZOFRAN ODT) 8 MG DISINTEGRATING  TABLET    Take 1 tablet (8 mg total) by mouth every 8 (eight) hours as needed.   OSELTAMIVIR (TAMIFLU) 75 MG CAPSULE    Take 1 capsule (75 mg total) by mouth 2 (two) times daily.    Patient Care Team: Paulene Floor as PCP - General (Physician Assistant)      Objective:   Vitals: BP 118/79 (BP Location: Left Arm, Patient Position: Sitting, Cuff Size: Normal)   Pulse 71   Temp 98.7 F (37.1 C) (Oral)   Ht 5\' 3"  (1.6 m)   Wt 123 lb 9.6 oz (56.1 kg)   BMI 21.89 kg/m    Physical Exam Constitutional:      Appearance: Normal appearance.  Cardiovascular:     Rate and Rhythm: Normal rate and regular rhythm.     Heart sounds: Normal heart sounds.  Pulmonary:     Effort: Pulmonary effort is normal.     Breath sounds: Normal breath sounds.  Abdominal:     General: Bowel sounds are normal.     Palpations: Abdomen is soft.  Skin:    General: Skin is warm and dry.  Neurological:     Mental Status: She is alert and oriented to person, place, and time. Mental status is at baseline.  Psychiatric:        Mood and Affect: Mood is anxious.        Behavior: Behavior normal.      Depression Screen PHQ 2/9 Scores 07/15/2018  PHQ - 2 Score 3  PHQ- 9 Score 14      Assessment & Plan:     Routine Health Maintenance and Physical Exam  Exercise Activities and Dietary recommendations Goals   None      There is no immunization history on file for this patient.  Health Maintenance  Topic Date Due  . HIV Screening  04/30/2009  . TETANUS/TDAP  04/30/2013  . PAP-Cervical Cytology Screening  05/01/2015  . INFLUENZA VACCINE  10/02/2018  . PAP SMEAR-Modifier  09/20/2019     Discussed health benefits of physical activity, and encouraged her to engage in regular exercise appropriate for her age and condition.    1. Annual physical exam  - HIV antibody (with reflex) - Comprehensive Metabolic Panel (CMET) - Lipid Profile - TSH - CBC with Differential  2. Anxiety  -  busPIRone (BUSPAR) 7.5 MG tablet; Take 1 tablet (7.5 mg total) by mouth 2 (two) times daily.  Dispense: 180 tablet; Refill: 0 - hydrOXYzine (ATARAX/VISTARIL) 10 MG tablet; Take 1 tablet (10 mg total) by mouth daily as needed.  Dispense: 30 tablet; Refill: 0 - Ambulatory referral to [facility]  3. Nausea  - ondansetron (ZOFRAN) 4 MG tablet; Take 1 tablet (4 mg total) by mouth every 8 (eight) hours  as needed for nausea or vomiting.  Dispense: 20 tablet; Refill: 0  4. Need for Td vaccine  - Td vaccine greater than or equal to 7yo preservative free IM  F/u 6 weeks for anxiety.   The entirety of the information documented in the History of Present Illness, Review of Systems and Physical Exam were personally obtained by me. Portions of this information were initially documented by [facility], CMA and reviewed by me for thoroughness and accuracy.      --------------------------------------------------------------------

## 2018-07-15 NOTE — Patient Instructions (Addendum)
Health Maintenance, Female Adopting a healthy lifestyle and getting preventive care can go a long way to promote health and wellness. Talk with your health care provider about what schedule of regular examinations is right for you. This is a good chance for you to check in with your provider about disease prevention and staying healthy. In between checkups, there are plenty of things you can do on your own. Experts have done a lot of research about which lifestyle changes and preventive measures are most likely to keep you healthy. Ask your health care provider for more information. Weight and diet Eat a healthy diet  Be sure to include plenty of vegetables, fruits, low-fat dairy products, and lean protein.  Do not eat a lot of foods high in solid fats, added sugars, or salt.  Get regular exercise. This is one of the most important things you can do for your health. ? Most adults should exercise for at least 150 minutes each week. The exercise should increase your heart rate and make you sweat (moderate-intensity exercise). ? Most adults should also do strengthening exercises at least twice a week. This is in addition to the moderate-intensity exercise. Maintain a healthy weight  Body mass index (BMI) is a measurement that can be used to identify possible weight problems. It estimates body fat based on height and weight. Your health care provider can help determine your BMI and help you achieve or maintain a healthy weight.  For females 20 years of age and older: ? A BMI below 18.5 is considered underweight. ? A BMI of 18.5 to 24.9 is normal. ? A BMI of 25 to 29.9 is considered overweight. ? A BMI of 30 and above is considered obese. Watch levels of cholesterol and blood lipids  You should start having your blood tested for lipids and cholesterol at 24 years of age, then have this test every 5 years.  You may need to have your cholesterol levels checked more often if: ? Your lipid or  cholesterol levels are high. ? You are older than 24 years of age. ? You are at high risk for heart disease. Cancer screening Lung Cancer  Lung cancer screening is recommended for adults 55-80 years old who are at high risk for lung cancer because of a history of smoking.  A yearly low-dose CT scan of the lungs is recommended for people who: ? Currently smoke. ? Have quit within the past 15 years. ? Have at least a 30-pack-year history of smoking. A pack year is smoking an average of one pack of cigarettes a day for 1 year.  Yearly screening should continue until it has been 15 years since you quit.  Yearly screening should stop if you develop a health problem that would prevent you from having lung cancer treatment. Breast Cancer  Practice breast self-awareness. This means understanding how your breasts normally appear and feel.  It also means doing regular breast self-exams. Let your health care provider know about any changes, no matter how small.  If you are in your 20s or 30s, you should have a clinical breast exam (CBE) by a health care provider every 1-3 years as part of a regular health exam.  If you are 40 or older, have a CBE every year. Also consider having a breast X-ray (mammogram) every year.  If you have a family history of breast cancer, talk to your health care provider about genetic screening.  If you are at high risk for breast cancer, talk   to your health care provider about having an MRI and a mammogram every year.  Breast cancer gene (BRCA) assessment is recommended for women who have family members with BRCA-related cancers. BRCA-related cancers include: ? Breast. ? Ovarian. ? Tubal. ? Peritoneal cancers.  Results of the assessment will determine the need for genetic counseling and BRCA1 and BRCA2 testing. Cervical Cancer Your health care provider may recommend that you be screened regularly for cancer of the pelvic organs (ovaries, uterus, and vagina).  This screening involves a pelvic examination, including checking for microscopic changes to the surface of your cervix (Pap test). You may be encouraged to have this screening done every 3 years, beginning at age 21.  For women ages 30-65, health care providers may recommend pelvic exams and Pap testing every 3 years, or they may recommend the Pap and pelvic exam, combined with testing for human papilloma virus (HPV), every 5 years. Some types of HPV increase your risk of cervical cancer. Testing for HPV may also be done on women of any age with unclear Pap test results.  Other health care providers may not recommend any screening for nonpregnant women who are considered low risk for pelvic cancer and who do not have symptoms. Ask your health care provider if a screening pelvic exam is right for you.  If you have had past treatment for cervical cancer or a condition that could lead to cancer, you need Pap tests and screening for cancer for at least 20 years after your treatment. If Pap tests have been discontinued, your risk factors (such as having a new sexual partner) need to be reassessed to determine if screening should resume. Some women have medical problems that increase the chance of getting cervical cancer. In these cases, your health care provider may recommend more frequent screening and Pap tests. Colorectal Cancer  This type of cancer can be detected and often prevented.  Routine colorectal cancer screening usually begins at 24 years of age and continues through 24 years of age.  Your health care provider may recommend screening at an earlier age if you have risk factors for colon cancer.  Your health care provider may also recommend using home test kits to check for hidden blood in the stool.  A small camera at the end of a tube can be used to examine your colon directly (sigmoidoscopy or colonoscopy). This is done to check for the earliest forms of colorectal cancer.  Routine  screening usually begins at age 50.  Direct examination of the colon should be repeated every 5-10 years through 24 years of age. However, you may need to be screened more often if early forms of precancerous polyps or small growths are found. Skin Cancer  Check your skin from head to toe regularly.  Tell your health care provider about any new moles or changes in moles, especially if there is a change in a mole's shape or color.  Also tell your health care provider if you have a mole that is larger than the size of a pencil eraser.  Always use sunscreen. Apply sunscreen liberally and repeatedly throughout the day.  Protect yourself by wearing long sleeves, pants, a wide-brimmed hat, and sunglasses whenever you are outside. Heart disease, diabetes, and high blood pressure  High blood pressure causes heart disease and increases the risk of stroke. High blood pressure is more likely to develop in: ? People who have blood pressure in the high end of the normal range (130-139/85-89 mm Hg). ? People   who are overweight or obese. ? People who are African American.  If you are 84-22 years of age, have your blood pressure checked every 3-5 years. If you are 67 years of age or older, have your blood pressure checked every year. You should have your blood pressure measured twice-once when you are at a hospital or clinic, and once when you are not at a hospital or clinic. Record the average of the two measurements. To check your blood pressure when you are not at a hospital or clinic, you can use: ? An automated blood pressure machine at a pharmacy. ? A home blood pressure monitor.  If you are between 52 years and 3 years old, ask your health care provider if you should take aspirin to prevent strokes.  Have regular diabetes screenings. This involves taking a blood sample to check your fasting blood sugar level. ? If you are at a normal weight and have a low risk for diabetes, have this test once  every three years after 24 years of age. ? If you are overweight and have a high risk for diabetes, consider being tested at a younger age or more often. Preventing infection Hepatitis B  If you have a higher risk for hepatitis B, you should be screened for this virus. You are considered at high risk for hepatitis B if: ? You were born in a country where hepatitis B is common. Ask your health care provider which countries are considered high risk. ? Your parents were born in a high-risk country, and you have not been immunized against hepatitis B (hepatitis B vaccine). ? You have HIV or AIDS. ? You use needles to inject street drugs. ? You live with someone who has hepatitis B. ? You have had sex with someone who has hepatitis B. ? You get hemodialysis treatment. ? You take certain medicines for conditions, including cancer, organ transplantation, and autoimmune conditions. Hepatitis C  Blood testing is recommended for: ? Everyone born from [birth year] through 1966. ? Anyone with known risk factors for hepatitis C. Sexually transmitted infections (STIs)  You should be screened for sexually transmitted infections (STIs) including gonorrhea and chlamydia if: ? You are sexually active and are younger than 24 years of age. ? You are older than 24 years of age and your health care provider tells you that you are at risk for this type of infection. ? Your sexual activity has changed since you were last screened and you are at an increased risk for chlamydia or gonorrhea. Ask your health care provider if you are at risk.  If you do not have HIV, but are at risk, it may be recommended that you take a prescription medicine daily to prevent HIV infection. This is called pre-exposure prophylaxis (PrEP). You are considered at risk if: ? You are sexually active and do not regularly use condoms or know the HIV status of your partner(s). ? You take drugs by injection. ? You are sexually active with a partner  who has HIV. Talk with your health care provider about whether you are at high risk of being infected with HIV. If you choose to begin PrEP, you should first be tested for HIV. You should then be tested every 3 months for as long as you are taking PrEP. Pregnancy  If you are premenopausal and you may become pregnant, ask your health care provider about preconception counseling.  If you may become pregnant, take 400 to 800 micrograms (mcg) of folic acid every  day.  If you want to prevent pregnancy, talk to your health care provider about birth control (contraception). Osteoporosis and menopause  Osteoporosis is a disease in which the bones lose minerals and strength with aging. This can result in serious bone fractures. Your risk for osteoporosis can be identified using a bone density scan.  If you are 38 years of age or older, or if you are at risk for osteoporosis and fractures, ask your health care provider if you should be screened.  Ask your health care provider whether you should take a calcium or vitamin D supplement to lower your risk for osteoporosis.  Menopause may have certain physical symptoms and risks.  Hormone replacement therapy may reduce some of these symptoms and risks. Talk to your health care provider about whether hormone replacement therapy is right for you. Follow these instructions at home:  Schedule regular health, dental, and eye exams.  Stay current with your immunizations.  Do not use any tobacco products including cigarettes, chewing tobacco, or electronic cigarettes.  If you are pregnant, do not drink alcohol.  If you are breastfeeding, limit how much and how often you drink alcohol.  Limit alcohol intake to no more than 1 drink per day for nonpregnant women. One drink equals 12 ounces of beer, 5 ounces of wine, or 1 ounces of hard liquor.  Do not use street drugs.  Do not share needles.  Ask your health care provider for help if you need support  or information about quitting drugs.  Tell your health care provider if you often feel depressed.  Tell your health care provider if you have ever been abused or do not feel safe at home. This information is not intended to replace advice given to you by your health care provider. Make sure you discuss any questions you have with your health care provider. Document Released: 09/02/2010 Document Revised: 07/26/2015 Document Reviewed: 11/21/2014 Elsevier Interactive Patient Education  2019 [place] Maintenance, Female Adopting a healthy lifestyle and getting preventive care can go a long way to promote health and wellness. Talk with your health care provider about what schedule of regular examinations is right for you. This is a good chance for you to check in with your provider about disease prevention and staying healthy. In between checkups, there are plenty of things you can do on your own. Experts have done a lot of research about which lifestyle changes and preventive measures are most likely to keep you healthy. Ask your health care provider for more information. Weight and diet Eat a healthy diet  Be sure to include plenty of vegetables, fruits, low-fat dairy products, and lean protein.  Do not eat a lot of foods high in solid fats, added sugars, or salt.  Get regular exercise. This is one of the most important things you can do for your health. ? Most adults should exercise for at least 150 minutes each week. The exercise should increase your heart rate and make you sweat (moderate-intensity exercise). ? Most adults should also do strengthening exercises at least twice a week. This is in addition to the moderate-intensity exercise. Maintain a healthy weight  Body mass index (BMI) is a measurement that can be used to identify possible weight problems. It estimates body fat based on height and weight. Your health care provider can help determine your BMI and help you achieve  or maintain a healthy weight.  For females 69 years of age and older: ? A BMI below 18.5  is considered underweight. ? A BMI of 18.5 to 24.9 is normal. ? A BMI of 25 to 29.9 is considered overweight. ? A BMI of 30 and above is considered obese. Watch levels of cholesterol and blood lipids  You should start having your blood tested for lipids and cholesterol at 24 years of age, then have this test every 5 years.  You may need to have your cholesterol levels checked more often if: ? Your lipid or cholesterol levels are high. ? You are older than 24 years of age. ? You are at high risk for heart disease. Cancer screening Lung Cancer  Lung cancer screening is recommended for adults 66-13 years old who are at high risk for lung cancer because of a history of smoking.  A yearly low-dose CT scan of the lungs is recommended for people who: ? Currently smoke. ? Have quit within the past 15 years. ? Have at least a 30-pack-year history of smoking. A pack year is smoking an average of one pack of cigarettes a day for 1 year.  Yearly screening should continue until it has been 15 years since you quit.  Yearly screening should stop if you develop a health problem that would prevent you from having lung cancer treatment. Breast Cancer  Practice breast self-awareness. This means understanding how your breasts normally appear and feel.  It also means doing regular breast self-exams. Let your health care provider know about any changes, no matter how small.  If you are in your 20s or 30s, you should have a clinical breast exam (CBE) by a health care provider every 1-3 years as part of a regular health exam.  If you are 58 or older, have a CBE every year. Also consider having a breast X-ray (mammogram) every year.  If you have a family history of breast cancer, talk to your health care provider about genetic screening.  If you are at high risk for breast cancer, talk to your health care provider  about having an MRI and a mammogram every year.  Breast cancer gene (BRCA) assessment is recommended for women who have family members with BRCA-related cancers. BRCA-related cancers include: ? Breast. ? Ovarian. ? Tubal. ? Peritoneal cancers.  Results of the assessment will determine the need for genetic counseling and BRCA1 and BRCA2 testing. Cervical Cancer Your health care provider may recommend that you be screened regularly for cancer of the pelvic organs (ovaries, uterus, and vagina). This screening involves a pelvic examination, including checking for microscopic changes to the surface of your cervix (Pap test). You may be encouraged to have this screening done every 3 years, beginning at age 54.  For women ages 9-65, health care providers may recommend pelvic exams and Pap testing every 3 years, or they may recommend the Pap and pelvic exam, combined with testing for human papilloma virus (HPV), every 5 years. Some types of HPV increase your risk of cervical cancer. Testing for HPV may also be done on women of any age with unclear Pap test results.  Other health care providers may not recommend any screening for nonpregnant women who are considered low risk for pelvic cancer and who do not have symptoms. Ask your health care provider if a screening pelvic exam is right for you.  If you have had past treatment for cervical cancer or a condition that could lead to cancer, you need Pap tests and screening for cancer for at least 20 years after your treatment. If Pap tests have been  discontinued, your risk factors (such as having a new sexual partner) need to be reassessed to determine if screening should resume. Some women have medical problems that increase the chance of getting cervical cancer. In these cases, your health care provider may recommend more frequent screening and Pap tests. Colorectal Cancer  This type of cancer can be detected and often prevented.  Routine colorectal  cancer screening usually begins at 24 years of age and continues through 24 years of age.  Your health care provider may recommend screening at an earlier age if you have risk factors for colon cancer.  Your health care provider may also recommend using home test kits to check for hidden blood in the stool.  A small camera at the end of a tube can be used to examine your colon directly (sigmoidoscopy or colonoscopy). This is done to check for the earliest forms of colorectal cancer.  Routine screening usually begins at age 50.  Direct examination of the colon should be repeated every 5-10 years through 24 years of age. However, you may need to be screened more often if early forms of precancerous polyps or small growths are found. Skin Cancer  Check your skin from head to toe regularly.  Tell your health care provider about any new moles or changes in moles, especially if there is a change in a mole's shape or color.  Also tell your health care provider if you have a mole that is larger than the size of a pencil eraser.  Always use sunscreen. Apply sunscreen liberally and repeatedly throughout the day.  Protect yourself by wearing long sleeves, pants, a wide-brimmed hat, and sunglasses whenever you are outside. Heart disease, diabetes, and high blood pressure  High blood pressure causes heart disease and increases the risk of stroke. High blood pressure is more likely to develop in: ? People who have blood pressure in the high end of the normal range (130-139/85-89 mm Hg). ? People who are overweight or obese. ? People who are African American.  If you are 18-39 years of age, have your blood pressure checked every 3-5 years. If you are 40 years of age or older, have your blood pressure checked every year. You should have your blood pressure measured twice-once when you are at a hospital or clinic, and once when you are not at a hospital or clinic. Record the average of the two  measurements. To check your blood pressure when you are not at a hospital or clinic, you can use: ? An automated blood pressure machine at a pharmacy. ? A home blood pressure monitor.  If you are between 55 years and 79 years old, ask your health care provider if you should take aspirin to prevent strokes.  Have regular diabetes screenings. This involves taking a blood sample to check your fasting blood sugar level. ? If you are at a normal weight and have a low risk for diabetes, have this test once every three years after 24 years of age. ? If you are overweight and have a high risk for diabetes, consider being tested at a younger age or more often. Preventing infection Hepatitis B  If you have a higher risk for hepatitis B, you should be screened for this virus. You are considered at high risk for hepatitis B if: ? You were born in a country where hepatitis B is common. Ask your health care provider which countries are considered high risk. ? Your parents were born in a high-risk country,   and you have not been immunized against hepatitis B (hepatitis B vaccine). ? You have HIV or AIDS. ? You use needles to inject street drugs. ? You live with someone who has hepatitis B. ? You have had sex with someone who has hepatitis B. ? You get hemodialysis treatment. ? You take certain medicines for conditions, including cancer, organ transplantation, and autoimmune conditions. Hepatitis C  Blood testing is recommended for: ? Everyone born from 1946 through 1965. ? Anyone with known risk factors for hepatitis C. Sexually transmitted infections (STIs)  You should be screened for sexually transmitted infections (STIs) including gonorrhea and chlamydia if: ? You are sexually active and are younger than 24 years of age. ? You are older than 24 years of age and your health care provider tells you that you are at risk for this type of infection. ? Your sexual activity has changed since you were last  screened and you are at an increased risk for chlamydia or gonorrhea. Ask your health care provider if you are at risk.  If you do not have HIV, but are at risk, it may be recommended that you take a prescription medicine daily to prevent HIV infection. This is called pre-exposure prophylaxis (PrEP). You are considered at risk if: ? You are sexually active and do not regularly use condoms or know the HIV status of your partner(s). ? You take drugs by injection. ? You are sexually active with a partner who has HIV. Talk with your health care provider about whether you are at high risk of being infected with HIV. If you choose to begin PrEP, you should first be tested for HIV. You should then be tested every 3 months for as long as you are taking PrEP. Pregnancy  If you are premenopausal and you may become pregnant, ask your health care provider about preconception counseling.  If you may become pregnant, take 400 to 800 micrograms (mcg) of folic acid every day.  If you want to prevent pregnancy, talk to your health care provider about birth control (contraception). Osteoporosis and menopause  Osteoporosis is a disease in which the bones lose minerals and strength with aging. This can result in serious bone fractures. Your risk for osteoporosis can be identified using a bone density scan.  If you are 65 years of age or older, or if you are at risk for osteoporosis and fractures, ask your health care provider if you should be screened.  Ask your health care provider whether you should take a calcium or vitamin D supplement to lower your risk for osteoporosis.  Menopause may have certain physical symptoms and risks.  Hormone replacement therapy may reduce some of these symptoms and risks. Talk to your health care provider about whether hormone replacement therapy is right for you. Follow these instructions at home:  Schedule regular health, dental, and eye exams.  Stay current with your  immunizations.  Do not use any tobacco products including cigarettes, chewing tobacco, or electronic cigarettes.  If you are pregnant, do not drink alcohol.  If you are breastfeeding, limit how much and how often you drink alcohol.  Limit alcohol intake to no more than 1 drink per day for nonpregnant women. One drink equals 12 ounces of beer, 5 ounces of wine, or 1 ounces of hard liquor.  Do not use street drugs.  Do not share needles.  Ask your health care provider for help if you need support or information about quitting drugs.  Tell your health   care provider if you often feel depressed.  Tell your health care provider if you have ever been abused or do not feel safe at home. This information is not intended to replace advice given to you by your health care provider. Make sure you discuss any questions you have with your health care provider. Document Released: 09/02/2010 Document Revised: 07/26/2015 Document Reviewed: 11/21/2014 Elsevier Interactive Patient Education  2019 [employer].

## 2018-07-16 ENCOUNTER — Telehealth: Payer: Self-pay

## 2018-07-16 LAB — CBC WITH DIFFERENTIAL/PLATELET
Basophils Absolute: 0 10*3/uL (ref 0.0–0.2)
Basos: 0 %
EOS (ABSOLUTE): 0.2 10*3/uL (ref 0.0–0.4)
Eos: 3 %
Hematocrit: 41.8 % (ref 34.0–46.6)
Hemoglobin: 14.5 g/dL (ref 11.1–15.9)
Immature Grans (Abs): 0 10*3/uL (ref 0.0–0.1)
Immature Granulocytes: 0 %
Lymphocytes Absolute: 1.9 10*3/uL (ref 0.7–3.1)
Lymphs: 29 %
MCH: 30.2 pg (ref 26.6–33.0)
MCHC: 34.7 g/dL (ref 31.5–35.7)
MCV: 87 fL (ref 79–97)
Monocytes Absolute: 0.4 10*3/uL (ref 0.1–0.9)
Monocytes: 6 %
Neutrophils Absolute: 4.1 10*3/uL (ref 1.4–7.0)
Neutrophils: 62 %
Platelets: 292 10*3/uL (ref 150–450)
RBC: 4.8 x10E6/uL (ref 3.77–5.28)
RDW: 12.1 % (ref 11.7–15.4)
WBC: 6.7 10*3/uL (ref 3.4–10.8)

## 2018-07-16 LAB — COMPREHENSIVE METABOLIC PANEL
ALT: 16 IU/L (ref 0–32)
AST: 20 IU/L (ref 0–40)
Albumin/Globulin Ratio: 1.7 (ref 1.2–2.2)
Albumin: 4.7 g/dL (ref 3.9–5.0)
Alkaline Phosphatase: 56 IU/L (ref 39–117)
BUN/Creatinine Ratio: 15 (ref 9–23)
BUN: 11 mg/dL (ref 6–20)
Bilirubin Total: 0.4 mg/dL (ref 0.0–1.2)
CO2: 21 mmol/L (ref 20–29)
Calcium: 9.1 mg/dL (ref 8.7–10.2)
Chloride: 102 mmol/L (ref 96–106)
Creatinine, Ser: 0.71 mg/dL (ref 0.57–1.00)
GFR calc Af Amer: 138 mL/min/{1.73_m2} (ref >59)
GFR calc non Af Amer: 120 mL/min/{1.73_m2} (ref >59)
Globulin, Total: 2.7 g/dL (ref 1.5–4.5)
Glucose: 82 mg/dL (ref 65–99)
Potassium: 3.6 mmol/L (ref 3.5–5.2)
Sodium: 139 mmol/L (ref 134–144)
Total Protein: 7.4 g/dL (ref 6.0–8.5)

## 2018-07-16 LAB — LIPID PANEL
Chol/HDL Ratio: 2.8 ratio (ref 0.0–4.4)
Cholesterol, Total: 139 mg/dL (ref 100–199)
HDL: 49 mg/dL (ref >39)
LDL Calculated: 75 mg/dL (ref 0–99)
Triglycerides: 76 mg/dL (ref 0–149)
VLDL Cholesterol Cal: 15 mg/dL (ref 5–40)

## 2018-07-16 LAB — TSH: TSH: 2.96 u[IU]/mL (ref 0.450–4.500)

## 2018-07-16 LAB — HIV ANTIBODY (ROUTINE TESTING W REFLEX): HIV Screen 4th Generation wRfx: NONREACTIVE

## 2018-07-16 NOTE — Telephone Encounter (Signed)
-----   Message from Trinna Post, [place] sent at 07/16/2018  8:21 AM EDT ----- Labwork all normal, no diabetes. Cholesterol looks good.

## 2018-07-16 NOTE — Telephone Encounter (Signed)
Patient notified of lab results

## 2018-07-21 ENCOUNTER — Ambulatory Visit: Payer: Self-pay

## 2018-07-21 NOTE — Chronic Care Management (AMB) (Signed)
  Care Management   Note  07/21/2018 Name: Amber Wilson MRN: [id number] DOB: 02/12/1996  Amber Wilson. Amber Wilson is a 23 year old female who sees Amber Wilson, Amber Wilson for primary care. Amber Wilson asked the CCM team to consult the patient for care coordination related to patients desire for counseling or counseling resources. Referral was placed 07/15/2018 during last office visit with PCP.  Telephone outreach to patient today to introduce CCM services.  Amber Wilson was given information about Care Management services today including:  1. Case Management services include personalized support from designated clinical staff supervised by a physician, including individualized plan of care and coordination with other care providers 2. 24/7 contact phone numbers for assistance for urgent and routine care needs. 3. The patient may stop CCM services at any time (effective at the end of the month) by phone call to the office staff.   Patient agreed to services and verbal consent obtained.     Plan: Initial assessment with LCSW scheduled for 07/30/2018 at 2:30  Miamarie Moll E. Rollene Rotunda, RN, BSN Nurse Care Coordinator [facility] [phone]

## 2018-07-22 ENCOUNTER — Ambulatory Visit: Payer: Self-pay | Admitting: Physician Assistant

## 2018-07-30 ENCOUNTER — Ambulatory Visit: Payer: Self-pay | Admitting: *Deleted

## 2018-07-30 ENCOUNTER — Telehealth: Payer: Self-pay

## 2018-07-30 DIAGNOSIS — F419 Anxiety disorder, unspecified: Secondary | ICD-10-CM

## 2018-07-30 DIAGNOSIS — R110 Nausea: Secondary | ICD-10-CM

## 2018-07-30 NOTE — Chronic Care Management (AMB) (Signed)
   Care Management   Social Work Note  07/30/2018 Name: Amber Wilson MRN: [id number] DOB: Jan 19, 1996  Amber Wilson is a 23 y.o. year old female who sees Trinna Post, [place] for primary care. The CCM team was consulted for assistance with Mental Health Counseling and Resources.   Phone call to patient today to schedule initial intake appointment to address the management of her anxiety.  Initial visit scheduled for 08/27/2018.  Goals Addressed   None     Follow Up Plan: SW will follow up with patient by phone over the next 08/27/18   Elliot Gurney, [place] Worker  [facility] [phone]

## 2018-07-30 NOTE — Patient Instructions (Signed)
Thank you allowing the Chronic Care Management Team to be a part of your care! It was a pleasure speaking with you today!  1. Please feel free to call this social worker with any questions or concerns.      CCM (Chronic Care Management) Team   Trish Fountain RN, BSN Nurse Care Coordinator  [phone]  Ruben Reason PharmD  Clinical Pharmacist  [phone]   [place], LCSW Clinical Social Worker [phone]  Goals Addressed   None      The patient verbalized understanding of instructions provided today and declined a print copy of patient instruction materials.   Telephone follow up appointment with care management team member scheduled for:08/27/2018

## 2018-08-06 ENCOUNTER — Other Ambulatory Visit: Payer: Self-pay | Admitting: Physician Assistant

## 2018-08-06 DIAGNOSIS — F419 Anxiety disorder, unspecified: Secondary | ICD-10-CM

## 2018-08-20 ENCOUNTER — Ambulatory Visit: Payer: Self-pay | Admitting: *Deleted

## 2018-08-20 ENCOUNTER — Ambulatory Visit (INDEPENDENT_AMBULATORY_CARE_PROVIDER_SITE_OTHER): Payer: BC Managed Care – PPO | Admitting: Physician Assistant

## 2018-08-20 ENCOUNTER — Other Ambulatory Visit: Payer: Self-pay

## 2018-08-20 ENCOUNTER — Telehealth: Payer: Self-pay

## 2018-08-20 ENCOUNTER — Encounter: Payer: Self-pay | Admitting: Physician Assistant

## 2018-08-20 ENCOUNTER — Encounter: Payer: Self-pay | Admitting: *Deleted

## 2018-08-20 VITALS — BP 107/66 | HR 67 | Temp 98.3°F | Resp 16 | Wt 123.0 lb

## 2018-08-20 DIAGNOSIS — F419 Anxiety disorder, unspecified: Secondary | ICD-10-CM

## 2018-08-20 MED ORDER — BUSPIRONE HCL 15 MG PO TABS: 15.0000 mg | ORAL_TABLET | Freq: Two times a day (BID) | ORAL | 0 refills | Status: DC

## 2018-08-20 NOTE — Patient Instructions (Signed)

## 2018-08-20 NOTE — Progress Notes (Signed)
Patient: Amber Wilson Female    DOB: 02-Dec-1995   23 y.o.   MRN: [id number] Visit Date: 08/20/2018  Today's Provider: Trinna Post, PA-C   Chief Complaint  Patient presents with  . Follow-up  . Anxiety   Subjective:     HPI  Follow up for anxiety  The patient was last seen for this 4 weeks ago. Changes made at last visit include starting buspirone and hydroxyzine.  She reports excellent compliance with treatment. She feels that condition is Improved. She is not having side effects.   Talked to [place] worker who has recommended more Research scientist (medical).   Reports overall anxiety is better and she is less irritable, especially with her partner. Reports she still wakes up at night for two hours due to feeling anxiety. 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 Overall better.   Staying at current job, has come to more peace regarding this. Plans to stay for around two years.  ------------------------------------------------------------------------------------   Allergies  Allergen Reactions  . Penicillins      Current Outpatient Medications:  .  busPIRone (BUSPAR) 7.5 MG tablet, Take 1 tablet (7.5 mg total) by mouth 2 (two) times daily., Disp: 180 tablet, Rfl: 0 .  hydrOXYzine (ATARAX/VISTARIL) 10 MG tablet, TAKE 1 TABLET BY MOUTH EVERY DAY AS NEEDED, Disp: 30 tablet, Rfl: 0 .  norethindrone-ethinyl estradiol (JUNEL FE 1/20) 1-20 MG-MCG tablet, Take 1 tablet by mouth daily., Disp: 28 tablet, Rfl: 11 .  ondansetron (ZOFRAN) 4 MG tablet, Take 1 tablet (4 mg total) by mouth every 8 (eight) hours as needed for nausea or vomiting., Disp: 20 tablet, Rfl: 0  Review of Systems  Constitutional: Negative.   Respiratory: Negative.   Psychiatric/Behavioral: The patient is nervous/anxious.     Social History   Tobacco Use  . Smoking status: Former Research scientist (life sciences)  . Smokeless tobacco: Never Used  Substance Use Topics  . Alcohol use: Yes      Objective:   BP 107/66 (BP Location: Left Arm, Patient Position: Sitting, Cuff Size: Normal)   Pulse 67   Temp 98.3 F (36.8 C) (Oral)   Resp 16   Wt 123 lb (55.8 kg)   BMI 21.79 kg/m  Vitals:   08/20/18 1404  BP: 107/66  Pulse: 67  Resp: 16  Temp: 98.3 F (36.8 C)  TempSrc: Oral  Weight: 123 lb (55.8 kg)     Physical Exam Constitutional:      Appearance: Normal appearance.  Cardiovascular:     Rate and Rhythm: Normal rate and regular rhythm.     Heart sounds: Normal heart sounds.  Pulmonary:     Effort: Pulmonary effort is normal.     Breath sounds: Normal breath sounds.  Skin:    General: Skin is warm and dry.  Neurological:     Mental Status: She is alert and oriented to person, place, and time. Mental status is at baseline.  Psychiatric:        Mood and Affect: Mood normal.        Behavior: Behavior normal.         Assessment & Plan    1. Anxiety  Increase buspar to 15 mg BID and f/u 3 mo.   - busPIRone (BUSPAR) 15 MG tablet; Take 1 tablet (15 mg total) by mouth 2 (two) times daily.  Dispense: 180 tablet; Refill: 0  The entirety of the information documented in the History of Present Illness, Review of Systems  and Physical Exam were personally obtained by me. Portions of this information were initially documented by Lynford Humphrey, CMA and reviewed by me for thoroughness and accuracy.       Trinna Post, PA-C  [facility]

## 2018-08-23 NOTE — Chronic Care Management (AMB) (Signed)
   Care Management    Clinical Social Work General Note  08/23/2018 Name: Amber Wilson MRN: [id number] DOB: [birth year]  Amber Wilson is a 23 y.o. year old female who is a primary care patient of Trinna Post, [place]. The CCM was consulted to assist the patient with Mental Health Counseling and Resources.    Review of patient status, including review of consultants reports, relevant laboratory and other test results, and collaboration with appropriate care team members and the patient's provider was performed as part of comprehensive patient evaluation and provision of chronic care management services.    SDOH (Social Determinants of Health) screening performed today. See Care Plan Entry related to challenges with: Depression    Goals Addressed            This Visit's Progress   . "I need to handle this anxiety better" (pt-stated)       This social worker spoke to patient today by phone. Per patient, she has struggled with depression since childhood, stating that being severely bullied in high school and her parents divorce were the main catalyst. Per patient, she started therapy about 15 years ago when her parents divorced, went for approximately 2 years but stopped going. She was also treated with Paxil at that time. Patient reports suffering from anxiety daily. She reports difficulty sleeping and use of marijuana daily. Patient currently resides with her boyfriend and describes having a small group of friends outside of her family. Per patient , she is finding her anxiety to be a daily struggle to manage and is willing to follow up with a therapist that specializes in this area.  Current Barriers:  Marland Kitchen Mental Health Concerns  . Substance abuse issues - daily marijuana use to cope with anxiety  Clinical Social Work Clinical Goal(s):  Marland Kitchen Over the next 30 days, client will follow up with a mental health professional in the community * as directed by SW  Interventions: . Patient  interviewed and appropriate assessments performed . Provided patient with information about mental health specialist in the community that treat chronic anxiety . Discussed plans with patient for ongoing care management follow up and provided patient with direct contact information for care management team  Patient Self Care Activities:  . Attends all scheduled provider appointments . Performs ADL's independently . Performs IADL's independently  Initial goal documentation         Follow Up Plan: SW will follow up with patient by phone over the next 7 days regarding referral to a community therapist        Elliot Gurney, [place] Worker  [facility] [phone]

## 2018-08-23 NOTE — Patient Instructions (Addendum)
Thank you allowing the Chronic Care Management Team to be a part of your care! It was a pleasure speaking with you today!  1. This Education officer, museum to e-mail patient a list of in network mental health providers in patient's area for follow up e-mail: [email or web address] 2. Please call this social worker if there are any additional questions or concerns regarding resources for mental health follow up     CCM (Chronic Care Management) Team   Trish Fountain RN, BSN Nurse Care Coordinator  [phone]  Ruben Reason PharmD  Clinical Pharmacist  [phone]   [place], [place] Social Worker [phone]  Goals Addressed            This Visit's Progress   . "I need to handle this anxiety better" (pt-stated)       Current Barriers:  Marland Kitchen Mental Health Concerns  . Substance abuse issues - daily marijuana use to cope with anxiety  Clinical Social Work Clinical Goal(s):  Marland Kitchen Over the next 30 days, client will follow up with a mental health professional in the community * as directed by SW  Interventions: . Patient interviewed and appropriate assessments performed . Provided patient with information about mental health specialist in the community that treat chronic anxiety . Discussed plans with patient for ongoing care management follow up and provided patient with direct contact information for care management team  Patient Self Care Activities:  . Attends all scheduled provider appointments . Performs ADL's independently . Performs IADL's independently  Initial goal documentation         The patient verbalized understanding of instructions provided today and declined a print copy of patient instruction materials.   The patient will call the mental health resources as advised for ongoing mental health follow up.

## 2018-08-27 ENCOUNTER — Telehealth: Payer: Self-pay

## 2018-09-10 ENCOUNTER — Ambulatory Visit: Payer: Self-pay | Admitting: *Deleted

## 2018-09-10 NOTE — Chronic Care Management (AMB) (Signed)
  Chronic Care Management   Social Work Note  09/10/2018 Name: Amber Wilson MRN: [id number] DOB: 1996-02-01  Amber Wilson is a 23 y.o. year old female who sees Trinna Post, [place] for primary care. The CCM team was consulted for assistance with Mental Health Counseling and Resources.   Phone call to patient to follow up on mental health resources provided. Per patient, all the resources provided were not available. [facility] remains a possibility but are closed today. This social worker will call on Monday to discuss possible referral.  Goals Addressed   None     Follow Up Plan: SW will follow up with patient by phone over the next 2 weeks regarding referral for mental health follow up   Elliot Gurney, [place] Worker  [facility] [phone]

## 2018-09-13 ENCOUNTER — Other Ambulatory Visit: Payer: Self-pay | Admitting: Physician Assistant

## 2018-09-13 ENCOUNTER — Ambulatory Visit: Payer: Self-pay | Admitting: *Deleted

## 2018-09-13 DIAGNOSIS — R110 Nausea: Secondary | ICD-10-CM

## 2018-09-13 DIAGNOSIS — F419 Anxiety disorder, unspecified: Secondary | ICD-10-CM

## 2018-09-13 NOTE — Chronic Care Management (AMB) (Signed)
   Care Management    Clinical Social Work Follow Up Note  09/13/2018 Name: Amber Wilson MRN: [id number] DOB: 1995/09/14  Amber Wilson is a 23 y.o. year old female who is a primary care patient of Trinna Post, [place]. The CCM team was consulted for assistance with Mental Health Counseling and Resources.   Review of patient status, including review of consultants reports, other relevant assessments, and collaboration with appropriate care team members and the patient's provider was performed as part of comprehensive patient evaluation and provision of chronic care management services.     Goals Addressed            This Visit's Progress   . "I need to handle this anxiety better" (pt-stated)       Phone call to patient today to discuss referral for ongoing mental health therapy. List of mental health providers previously given to patient, however patient was unable to make contact. Phone call made today to [facility] to confirm that they were taking new patients. Referral requested from patient's provided.  Current Barriers:  Marland Kitchen Mental Health Concerns  . Substance abuse issues - daily marijuana use to cope with anxiety  Clinical Social Work Clinical Goal(s):  Marland Kitchen Over the next 30 days, client will follow up with a mental health professional in the community * as directed by SW  Interventions: . Patient interviewed and appropriate assessments performed . Referral requested from patient's provider to [facility] for mental health follow up . Informed patient of referral placed to the [facility] to meet her ongoing mental health follow up . Informed patient that [facility] will contact patient to schedule appointment once referral is received . Discussed plans with patient for ongoing care management follow up and provided patient with direct contact information for care management team  Patient  Self Care Activities:  . Attends all scheduled provider appointments . Performs ADL's independently . Performs IADL's independently  Please see past updates related to this goal by clicking on the "Past Updates" button in the selected goal          Follow Up Plan: SW will follow up with patient by phone over the next 2 weeks    [place]ec[place]Bishop, [place] Worker  [facility] [phone]

## 2018-09-13 NOTE — Patient Instructions (Signed)
Thank you allowing the Chronic Care Management Team to be a part of your care! It was a pleasure speaking with you today!  1. Please call this social worker with any questions or concerns regarding your mental health needs. 2. [facility] will call you to schedule an initial appointment.      CCM (Chronic Care Management) Team   Trish Fountain RN, BSN Nurse Care Coordinator  [phone]  Ruben Reason PharmD  Clinical Pharmacist  [phone]   Elliot Gurney, LCSW Clinical Social Worker [phone]  Goals Addressed            This Visit's Progress   . "I need to handle this anxiety better" (pt-stated)       Current Barriers:  Marland Kitchen Mental Health Concerns  . Substance abuse issues - daily marijuana use to cope with anxiety  Clinical Social Work Clinical Goal(s):  Marland Kitchen Over the next 30 days, client will follow up with a mental health professional in the community * as directed by SW  Interventions: . Patient interviewed and appropriate assessments performed . Referral requested from patient's provider to [facility] for mental health follow up . Informed patient of referral placed to the [facility] to meet her ongoing mental health follow up . Informed patient that [facility] will contact patient to schedule appointment once referral is received . Discussed plans with patient for ongoing care management follow up and provided patient with direct contact information for care management team  Patient Self Care Activities:  . Attends all scheduled provider appointments . Performs ADL's independently . Performs IADL's independently  Please see past updates related to this goal by clicking on the "Past Updates" button in the selected goal          The patient verbalized understanding of instructions provided today and declined a print copy of patient instruction materials.   This Research officer, political party to follow up with patient within 2 weeks.

## 2018-09-13 NOTE — Progress Notes (Signed)
Referral for counseling placed

## 2018-09-28 ENCOUNTER — Ambulatory Visit: Payer: Self-pay | Admitting: *Deleted

## 2018-09-28 DIAGNOSIS — R110 Nausea: Secondary | ICD-10-CM

## 2018-09-28 DIAGNOSIS — F419 Anxiety disorder, unspecified: Secondary | ICD-10-CM

## 2018-09-28 NOTE — Chronic Care Management (AMB) (Signed)
  Chronic Care Management   Social Work Note  09/28/2018 Name: BELICIA DIFATTA MRN: [id number] DOB: 1995/09/05  [name] is a 23 y.o. year old female who sees Trinna Post, [place] for primary care. The CCM team was consulted for assistance with Mental Health Counseling and Resources.   Follow up call to the referrals coordinator at [facility] to follow up on referral for mental health follow up. Referral received. Patient called today to schedule appointment, however patient did not answer. Referrals coordinator left message for a return call to schedule initial appointment.  Goals Addressed   None     Follow Up Plan: SW will follow up with patient by phone over the next 2 weeks regariding mental health referral   Elliot Gurney, [place] Worker  [facility] [phone]

## 2018-10-01 ENCOUNTER — Ambulatory Visit: Payer: Self-pay | Admitting: *Deleted

## 2018-10-01 ENCOUNTER — Telehealth: Payer: Self-pay

## 2018-10-01 ENCOUNTER — Encounter: Payer: Self-pay | Admitting: *Deleted

## 2018-10-01 DIAGNOSIS — F419 Anxiety disorder, unspecified: Secondary | ICD-10-CM

## 2018-10-01 NOTE — Patient Instructions (Signed)
Thank you allowing the Chronic Care Management Team to be a part of your care! It was a pleasure speaking with you today!  1. Please follow up with [facility] on 10/07/2018 for initial intake appointment 2. Please call this social worker with any questions or concerns related to your mental health needs.  CCM (Chronic Care Management) Team   Trish Fountain RN, BSN Nurse Care Coordinator  [phone]  Ruben Reason PharmD  Clinical Pharmacist  [phone]   Elliot Gurney, LCSW Clinical Social Worker [phone]  Goals Addressed            This Visit's Progress   . "I need to handle this anxiety better" (pt-stated)       Current Barriers:  Marland Kitchen Mental Health Concerns  . Substance abuse issues - daily marijuana use to cope with anxiety  Clinical Social Work Clinical Goal(s):  Marland Kitchen Over the next 30 days, client will follow up with a mental health professional in the community * as directed by SW  Interventions: . Patient interviewed and appropriate assessments performed . [facility] contacted and informed that patient has not scheduled an intake appointment . Patient contacted and strongly encouraged follow up phone call to [facility] to schedule intake appointment . Confirmed that initial intake appointment has been scheduled for 10/07/2018  Patient Self Care Activities:  . Attends all scheduled provider appointments . Performs ADL's independently . Performs IADL's independently  Please see past updates related to this goal by clicking on the "Past Updates" button in the selected goal          The patient verbalized understanding of instructions provided today and declined a print copy of patient instruction materials.   No further follow up required: patient to follow up with [facility] on 10/07/2018

## 2018-10-01 NOTE — Progress Notes (Signed)
This encounter was created in error - please disregard.

## 2018-10-01 NOTE — Chronic Care Management (AMB) (Signed)
    Care Management   Unsuccessful Call Note 10/01/2018 Name: AJIA CHADDERDON MRN: [id number] DOB: 06-Feb-1996  Paitent is a 23 year old female who sees Carles Collet, [place] for primary care. Carles Collet, PA-C asked the CCM team to consult the patient for mental health counseling and resources.    Was unable to reach patient via telephone today for follow up on referral to [facility]. I have left HIPAA compliant voicemail asking patient to return my call. (unsuccessful outreach #1).   Plan: Will follow-up within 7 business days via telephone.      Elliot Gurney, [place] Worker  [facility] [phone]

## 2018-10-01 NOTE — Chronic Care Management (AMB) (Signed)
   Care Management    Clinical Social Work Follow Up Note  10/01/2018 Name: Amber Wilson MRN: [id number] DOB: [birth year]  Amber Wilson is a 23 y.o. year old female who is a primary care patient of Amber Wilson, Amber Wilson. The CCM team was consulted for assistance with Mental Health Counseling and Resources.   Review of patient status, including review of consultants reports, other relevant assessments, and collaboration with appropriate care team members and the patient's provider was performed as part of comprehensive patient evaluation and provision of chronic care management services.     Goals Addressed            This Visit's Progress   . "I need to handle this anxiety better" (pt-stated)       Current Barriers:  Marland Kitchen Mental Health Concerns  . Substance abuse issues - daily marijuana use to cope with anxiety  Clinical Social Work Clinical Goal(s):  Marland Kitchen Over the next 30 days, client will follow up with a mental health professional in the community * as directed by SW  Interventions: . Patient interviewed and appropriate assessments performed . [facility] contacted and informed that patient has not scheduled an intake appointment . Patient contacted and strongly encouraged follow up phone call to Amber Wilson to schedule intake appointment . Confirmed that initial intake appointment has been scheduled for 10/07/2018  Patient Self Care Activities:  . Attends all scheduled provider appointments . Performs ADL's independently . Performs IADL's independently  Please see past updates related to this goal by clicking on the "Past Updates" button in the selected goal          Follow Up Plan: Client will follow up with Amber Wilson to address issues related to her symptoms of depression    Amber Wilson, [place] Worker  [facility] [phone]

## 2018-10-07 ENCOUNTER — Ambulatory Visit (INDEPENDENT_AMBULATORY_CARE_PROVIDER_SITE_OTHER): Payer: BC Managed Care – PPO | Admitting: Licensed Clinical Social Worker

## 2018-10-07 ENCOUNTER — Other Ambulatory Visit: Payer: Self-pay

## 2018-10-07 ENCOUNTER — Encounter (HOSPITAL_COMMUNITY): Payer: Self-pay | Admitting: Licensed Clinical Social Worker

## 2018-10-07 DIAGNOSIS — F411 Generalized anxiety disorder: Secondary | ICD-10-CM | POA: Diagnosis not present
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
## 2018-10-08 ENCOUNTER — Telehealth: Payer: Self-pay

## 2018-10-28 ENCOUNTER — Other Ambulatory Visit: Payer: Self-pay

## 2018-10-28 ENCOUNTER — Ambulatory Visit (INDEPENDENT_AMBULATORY_CARE_PROVIDER_SITE_OTHER): Payer: BC Managed Care – PPO | Admitting: Licensed Clinical Social Worker

## 2018-10-28 DIAGNOSIS — F411 Generalized anxiety disorder: Secondary | ICD-10-CM

## 2018-10-29 ENCOUNTER — Encounter (HOSPITAL_COMMUNITY): Payer: Self-pay | Admitting: Licensed Clinical Social Worker

## 2018-10-29 NOTE — Progress Notes (Signed)
Virtual Visit via Video Note  I connected with Amber Wilson on 10/29/18 at  4:00 PM EDT by a video enabled telemedicine application and verified that I am speaking with the correct person using two identifiers.  Location: Patient: Home Provider: Office   I discussed the limitations of evaluation and management by telemedicine and the availability of in person appointments. The patient expressed understanding and agreed to proceed.   THERAPIST PROGRESS NOTE  Session Time: 4:00 pm-4:40 pm  Participation Level: Active  Behavioral Response: CasualAlertAnxious  Type of Therapy: Individual Therapy  Treatment Goals addressed: Coping  Interventions: CBT and Play Therapy  Summary: Amber Wilson is a 23 y.o. female who presents oriented x5 (person, place, situation, time and object), casually dressed, appropriately groomed, average height, average weight, and cooperative to address anxiety. Patient has minimal history of medical treatment. Patient has a history of mental health treatment including outpatient therapy and medication management. Patient denies suicidal and homicidal ideations. Patient denies psychosis including auditory and visual hallucinations. Patient denies substance abuse. Patient is at low risk for lethality at this time.  Physically: Patient has been getting 8-10 hours of sleep but it is disrupted by nightmares. She does not feel rested. Patient's appetite is good. She is doing Yoga on a regular basis.  Spiritually/values: No issues identified.  Relationships: Patient is getting along with her boyfriend. Patient's friendships are limited. She has a best friend that she has not seen much of due to quarantine. Patient has met other people through a friendship app. Patient feels like she doesn't know how to interact with other adults. She feels like she rambles or is too quiet.  Emotionally/Mentally/Behavior: Patient has been experiencing anxiety. She almost had a panic  attack during yoga but was able to work through it. Patient was able to identify goals for treatment. She has been having nightmares that wake her up at night and disrupt her sleep. Patient agreed to use her journal to record her dreams, worries, and positive things/gratitude.    Patient engaged in session. Patient responded well to interventions. Patient continues to meet criteria for Generalized Anxiety Disorder. Patient will continue in outpatient therapy due to being the least restrictive service to meet her needs. Patient made no progress on her goals.   Suicidal/Homicidal: Negativewithout intent/plan  Therapist Response: Therapist reviewed patient's recent thoughts and behaviors. Therapist utilized CBT to address anxiety. Therapist processed patient's feelings to identify triggers for anxiety. Therapist completed treatment plan. Therapist discussed writing in a journal to record her anxiety.   Plan: Return again in 3 weeks.  Diagnosis: Axis I: Generalized Anxiety Disorder    Axis II: No diagnosis  I discussed the assessment and treatment plan with the patient. The patient was provided an opportunity to ask questions and all were answered. The patient agreed with the plan and demonstrated an understanding of the instructions.   The patient was advised to call back or seek an in-person evaluation if the symptoms worsen or if the condition fails to improve as anticipated.  I provided 40 minutes of non-face-to-face time during this encounter.  [name], LCSW 10/29/2018

## 2018-11-01 ENCOUNTER — Other Ambulatory Visit: Payer: Self-pay | Admitting: Physician Assistant

## 2018-11-01 DIAGNOSIS — Z30011 Encounter for initial prescription of contraceptive pills: Secondary | ICD-10-CM

## 2018-11-01 NOTE — Telephone Encounter (Signed)
[facility] faxed refill request for the following medications:  norethindrone-ethinyl estradiol (JUNEL FE 1/20) 1-20 MG-MCG tablet   Please advise.

## 2018-11-02 MED ORDER — NORETHIN ACE-ETH ESTRAD-FE 1-20 MG-MCG PO TABS: 1.0000 | ORAL_TABLET | Freq: Every day | ORAL | 11 refills | Status: DC

## 2018-11-11 ENCOUNTER — Other Ambulatory Visit: Payer: Self-pay

## 2018-11-11 ENCOUNTER — Ambulatory Visit (INDEPENDENT_AMBULATORY_CARE_PROVIDER_SITE_OTHER): Payer: BC Managed Care – PPO | Admitting: Licensed Clinical Social Worker

## 2018-11-11 DIAGNOSIS — F411 Generalized anxiety disorder: Secondary | ICD-10-CM

## 2018-11-12 ENCOUNTER — Encounter (HOSPITAL_COMMUNITY): Payer: Self-pay | Admitting: Licensed Clinical Social Worker

## 2018-11-12 NOTE — Progress Notes (Signed)
Virtual Visit via Video Note  I connected with Amber Wilson on 11/12/18 at  4:00 PM EDT by a video enabled telemedicine application and verified that I am speaking with the correct person using two identifiers.  Location: Patient: Home Provider: Office   I discussed the limitations of evaluation and management by telemedicine and the availability of in person appointments. The patient expressed understanding and agreed to proceed.   THERAPIST PROGRESS NOTE  Session Time: 4:00 pm-4:56 pm  Participation Level: Active  Behavioral Response: CasualAlertAnxious  Type of Therapy: Individual Therapy  Treatment Goals addressed: Coping  Interventions: CBT and Play Therapy  Summary: Amber Wilson is a 23 y.o. female who presents oriented x5 (person, place, situation, time and object), casually dressed, appropriately groomed, average height, average weight, and cooperative to address anxiety. Patient has minimal history of medical treatment. Patient has a history of mental health treatment including outpatient therapy and medication management. Patient denies suicidal and homicidal ideations. Patient denies psychosis including auditory and visual hallucinations. Patient denies substance abuse. Patient is at low risk for lethality at this time.  Physically: Patient noted that her sleep has improved. She is having less nightmares. Patient continues to do yoga on a regular basis.   Spiritually/values: No issues identified.  Relationships: Patient continues to get along with her boyfriend. She feels like she doesn't have strong boundaries with her boss and her other family members. Patient continues to have interactions with friends and acquaintances.  Emotionally/Mentally/Behavior: Patient continues to experience anxiety. She has been writing in her personal journal and in her dream journal. Both of these have helped relieve some anxiety. Patient feels burned out at work. Patient feels like  she works long hours and doesn't have much balance in her personal life. Patient is dreading work. 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 Patient agreed to talk to her boss about reducing her hours slightly so that she can get a break and have balance.   Patient engaged in session. Patient responded well to interventions. Patient continues to meet criteria for Generalized Anxiety Disorder. Patient will continue in outpatient therapy due to being the least restrictive service to meet her needs. Patient made minimal progress on her goals.   Suicidal/Homicidal: Negativewithout intent/plan  Therapist Response: Therapist reviewed patient's recent thoughts and behaviors. Therapist utilized CBT to address anxiety. Therapist processed patient's feelings to identify triggers for anxiety. Therapist completed treatment plan. Therapist discussed with patient setting personal boundaries.   Plan: Return again in 3 weeks.  Diagnosis: Axis I: Generalized Anxiety Disorder    Axis II: No diagnosis  I discussed the assessment and treatment plan with the patient. The patient was provided an opportunity to ask questions and all were answered. The patient agreed with the plan and demonstrated an understanding of the instructions.   The patient was advised to call back or seek an in-person evaluation if the symptoms worsen or if the condition fails to improve as anticipated.  I provided 56 minutes of non-face-to-face time during this encounter.  [name], LCSW 11/12/2018

## 2018-11-25 ENCOUNTER — Ambulatory Visit (INDEPENDENT_AMBULATORY_CARE_PROVIDER_SITE_OTHER): Payer: BC Managed Care – PPO | Admitting: Licensed Clinical Social Worker

## 2018-11-25 ENCOUNTER — Other Ambulatory Visit: Payer: Self-pay

## 2018-11-25 ENCOUNTER — Encounter (HOSPITAL_COMMUNITY): Payer: Self-pay | Admitting: Licensed Clinical Social Worker

## 2018-11-25 DIAGNOSIS — F411 Generalized anxiety disorder: Secondary | ICD-10-CM | POA: Diagnosis not present
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
## 2018-12-05 ENCOUNTER — Other Ambulatory Visit: Payer: Self-pay | Admitting: Physician Assistant

## 2018-12-05 DIAGNOSIS — F419 Anxiety disorder, unspecified: Secondary | ICD-10-CM

## 2018-12-09 ENCOUNTER — Ambulatory Visit (INDEPENDENT_AMBULATORY_CARE_PROVIDER_SITE_OTHER): Payer: BC Managed Care – PPO | Admitting: Licensed Clinical Social Worker

## 2018-12-09 ENCOUNTER — Encounter: Payer: Self-pay | Admitting: Emergency Medicine

## 2018-12-09 ENCOUNTER — Ambulatory Visit
Admission: EM | Admit: 2018-12-09 | Discharge: 2018-12-09 | Disposition: A | Discharge status: Home / Self Care | Payer: BC Managed Care – PPO | Attending: Emergency Medicine | Admitting: Emergency Medicine

## 2018-12-09 ENCOUNTER — Other Ambulatory Visit: Payer: Self-pay

## 2018-12-09 DIAGNOSIS — M791 Myalgia, unspecified site: Secondary | ICD-10-CM | POA: Diagnosis not present

## 2018-12-09 DIAGNOSIS — F411 Generalized anxiety disorder: Secondary | ICD-10-CM | POA: Diagnosis not present

## 2018-12-09 DIAGNOSIS — Z3202 Encounter for pregnancy test, result negative: Secondary | ICD-10-CM | POA: Diagnosis not present

## 2018-12-09 DIAGNOSIS — R0981 Nasal congestion: Secondary | ICD-10-CM | POA: Diagnosis not present

## 2018-12-09 DIAGNOSIS — Z7189 Other specified counseling: Secondary | ICD-10-CM

## 2018-12-09 DIAGNOSIS — J069 Acute upper respiratory infection, unspecified: Secondary | ICD-10-CM

## 2018-12-09 LAB — PREGNANCY, URINE: Preg Test, Ur: NEGATIVE

## 2018-12-09 NOTE — Discharge Instructions (Signed)
Over-the-counter medication as discussed.  Rest. Drink plenty of fluids.  Await test results.  Remain homeless seeking further care.  Monitor.  Follow up with your primary care physician this week as needed. Return to Urgent care for new or worsening concerns.

## 2018-12-09 NOTE — ED Triage Notes (Signed)
Patient says she had body aches and sinus drainage last night. She took sudafed and mucinex last night and today and spiked a fever of 101

## 2018-12-09 NOTE — ED Provider Notes (Signed)
[facility] ____________________________________________  Time seen: Approximately 3:38 PM  I have reviewed the triage vital signs and the nursing notes.   HISTORY  Chief Complaint Sinusitis   HPI Amber Wilson is a 23 y.o. female presenting for evaluation of nasal congestion, postnasal drainage and body aches present since yesterday afternoon and worsening last night.  Reports T-max 101 last night.  Has taken over-the-counter Mucinex and Sudafed, no antipyretics taken prior to arrival.  No accompanying cough.  Denies sore throat.  States sinus pressure.  Denies change in taste or smell, vomiting, diarrhea, chest pain or shortness of breath.  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  Patient does have a history of seasonal allergies, denies recent issues.  Denies other known sick contacts.  Reports otherwise doing well.  Trinna Post, PA-C: PCP   Past Medical History:  Diagnosis Date  . Allergy   . Anxiety   . Depression   . GERD (gastroesophageal reflux disease)     There are no active problems to display for this patient.   Past Surgical History:  Procedure Laterality Date  . TONSILLECTOMY    . WISDOM TOOTH EXTRACTION       No current facility-administered medications for this encounter.   Current Outpatient Medications:  .  busPIRone (BUSPAR) 15 MG tablet, TAKE 1 TABLET (15 MG TOTAL) BY MOUTH 2 (TWO) TIMES DAILY., Disp: 60 tablet, Rfl: 2 .  hydrOXYzine (ATARAX/VISTARIL) 10 MG tablet, TAKE 1 TABLET BY MOUTH EVERY DAY AS NEEDED, Disp: 30 tablet, Rfl: 0 .  norethindrone-ethinyl estradiol (JUNEL FE 1/20) 1-20 MG-MCG tablet, Take 1 tablet by mouth daily., Disp: 28 tablet, Rfl: 11 .  ondansetron (ZOFRAN) 4 MG tablet, Take 1 tablet (4 mg total) by mouth every 8 (eight) hours as needed for nausea or vomiting., Disp: 20 tablet, Rfl: 0  Allergies Penicillins and  Pollen extract  No family history on file.  Social History Social History   Tobacco Use  . Smoking status: Former Research scientist (life sciences)  . Smokeless tobacco: Never Used  Substance Use Topics  . Alcohol use: Yes  . Drug use: Yes    Frequency: 7.0 times per week    Types: Marijuana    Review of Systems Constitutional: No fever ENT: No sore throat. As above.  Cardiovascular: Denies chest pain. Respiratory: Denies shortness of breath. Gastrointestinal: No abdominal pain.   Musculoskeletal: Negative for back pain. Skin: Negative for rash.   ____________________________________________   PHYSICAL EXAM:  VITAL SIGNS: ED Triage Vitals  Enc Vitals Group     BP 12/09/18 1439 118/86     Pulse Rate 12/09/18 1439 90     Resp --      Temp 12/09/18 1439 99.3 F (37.4 C)     Temp Source 12/09/18 1439 Oral     SpO2 12/09/18 1439 95 %     Weight 12/09/18 1438 125 lb (56.7 kg)     Height 12/09/18 1438 5\' 3"  (1.6 m)     Head Circumference --      Peak Flow --      Pain Score 12/09/18 1437 5     Pain Loc --      Pain Edu? --      Excl. in [place]? --     Constitutional: Alert and oriented. Well appearing and in no acute distress. Eyes: Conjunctivae are normal.  Head: Atraumatic.Mild to moderate tenderness to palpation  bilateral maxillary sinuses.  No frontal sinus tenderness palpation.  No swelling. No erythema.   Ears: no erythema, normal TMs bilaterally.   Nose: nasal congestion with bilateral nasal turbinate erythema and edema.   Mouth/Throat: Mucous membranes are moist.  Oropharynx non-erythematous.No tonsillar swelling or exudate.  Neck: No stridor.  No cervical spine tenderness to palpation. Hematological/Lymphatic/Immunilogical: No cervical lymphadenopathy. Cardiovascular: Normal rate, regular rhythm. Grossly normal heart sounds.  Good peripheral circulation. Respiratory: Normal respiratory effort.  No retractions. No wheezes, rales or rhonchi. Good air movement.  Musculoskeletal: Steady  gait. Neurologic:  Normal speech and language. No gross focal neurologic deficits are appreciated. No gait instability. Skin:  Skin is warm, dry and intact. No rash noted. Psychiatric: Mood and affect are normal. Speech and behavior are normal. ___________________________________________   LABS (all labs ordered are listed, but only abnormal results are displayed)  Labs Reviewed  NOVEL CORONAVIRUS, NAA (HOSP ORDER, SEND-OUT TO REF LAB; TAT 18-24 HRS)  PREGNANCY, URINE   ____________________________________________   PROCEDURES Procedures   INITIAL IMPRESSION / ASSESSMENT AND PLAN / ED COURSE  Pertinent labs & imaging results that were available during my care of the patient were reviewed by me and considered in my medical decision making (see chart for details).  Very well-appearing patient.  No acute distress.  Suspect viral upper respiratory infection.  COVID-19 testing completed and advice given.  Work note given.  Encourage rest, fluids, supportive care, over-the-counter cough congestion medication as needed.  Discussed follow up with Primary care physician this week as needed. Discussed follow up and return parameters including no resolution or any worsening concerns. Patient verbalized understanding and agreed to plan.   ____________________________________________   FINAL CLINICAL IMPRESSION(S) / ED DIAGNOSES  Final diagnoses:  Acute upper respiratory infection  Advice given about COVID-19 virus infection     ED Discharge Orders    None       Note: This dictation was prepared with Dragon dictation along with smaller phrase technology. Any transcriptional errors that result from this process are unintentional.         Marylene Land, NP 12/09/18 1612

## 2018-12-10 ENCOUNTER — Encounter (HOSPITAL_COMMUNITY): Payer: Self-pay | Admitting: Licensed Clinical Social Worker

## 2018-12-10 LAB — NOVEL CORONAVIRUS, NAA (HOSP ORDER, SEND-OUT TO REF LAB; TAT 18-24 HRS): SARS-CoV-2, NAA: NOT DETECTED

## 2018-12-10 NOTE — Progress Notes (Signed)
Virtual Visit via Video Note  I connected with Amber Wilson on 12/10/18 at  4:00 PM EDT by a video enabled telemedicine application and verified that I am speaking with the correct person using two identifiers.  Location: Patient: Home Provider: Office   I discussed the limitations of evaluation and management by telemedicine and the availability of in person appointments. The patient expressed understanding and agreed to proceed.   THERAPIST PROGRESS NOTE  Session Time: 4:00 pm-4:50 pm  Participation Level: Active  Behavioral Response: CasualAlertAnxious  Type of Therapy: Individual Therapy  Treatment Goals addressed: Coping  Interventions: CBT and Solution Focused  Summary: Amber Wilson is a 23 y.o. female who presents oriented x5 (person, place, situation, time and object), casually dressed, appropriately groomed, average height, average weight, and cooperative to address anxiety. Patient has minimal history of medical treatment. Patient has a history of mental health treatment including outpatient therapy and medication management. Patient denies suicidal and homicidal ideations. Patient denies psychosis including auditory and visual hallucinations. Patient denies substance abuse. Patient is at low risk for lethality at this time.  Physically: Patient was sick. She had a high fever but no cough. Patient was told she may have [place] and is waiting on the test results.  Spiritually/values: No issues identified.  Relationships: Patient feels like her boss is not respecting her time. Patient said that her boss has been coming home from work late which then causes patient to be late to her after work obligations. 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 Patient understands that she needs to talk to her boss about what she wants for patient as far as  employment goes. Patient also noted that she gets nervous when she invites her boyfriend to gatherings. She feels like she is overly worried about him having a good time which stresses her out and can upset him. Patient agreed to take a step back and check in on her boyfriend less.   Emotionally/Mentally/Behavior: Patient was stable but frustrated with her boss. She feels like her time is not being respected. Patient is going to ask what is going on with her employment. Patient also agreed to manage her anxiety while out at a gathering with her boyfriend.   Patient engaged in session. Patient responded well to interventions. Patient continues to meet criteria for Generalized Anxiety Disorder. Patient will continue in outpatient therapy due to being the least restrictive service to meet her needs. Patient made minimal progress on her goals.   Suicidal/Homicidal: Negativewithout intent/plan  Therapist Response: Therapist reviewed patient's recent thoughts and behaviors. Therapist utilized CBT to address anxiety. Therapist processed patient's feelings to identify triggers for anxiety. Therapist discussed with patient her anxiety and ways to manage it at work and in social situations.   Plan: Return again in 3 weeks.  Diagnosis: Axis I: Generalized Anxiety Disorder    Axis II: No diagnosis  I discussed the assessment and treatment plan with the patient. The patient was provided an opportunity to ask questions and all were answered. The patient agreed with the plan and demonstrated an understanding of the instructions.   The patient was advised to call back or seek an in-person evaluation if the symptoms worsen or if the condition fails to improve as anticipated.  I provided 50 minutes of non-face-to-face time during this encounter.  [name], LCSW 12/10/2018

## 2019-01-05 ENCOUNTER — Ambulatory Visit (HOSPITAL_COMMUNITY): Payer: BC Managed Care – PPO | Admitting: Licensed Clinical Social Worker

## 2019-01-07 ENCOUNTER — Other Ambulatory Visit: Payer: Self-pay

## 2019-01-07 ENCOUNTER — Ambulatory Visit (INDEPENDENT_AMBULATORY_CARE_PROVIDER_SITE_OTHER): Payer: BC Managed Care – PPO | Admitting: Licensed Clinical Social Worker

## 2019-01-07 DIAGNOSIS — F411 Generalized anxiety disorder: Secondary | ICD-10-CM | POA: Diagnosis not present
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
## 2019-01-21 ENCOUNTER — Encounter (HOSPITAL_COMMUNITY): Payer: Self-pay | Admitting: Licensed Clinical Social Worker

## 2019-01-21 ENCOUNTER — Ambulatory Visit (INDEPENDENT_AMBULATORY_CARE_PROVIDER_SITE_OTHER): Payer: BC Managed Care – PPO | Admitting: Licensed Clinical Social Worker

## 2019-01-21 ENCOUNTER — Other Ambulatory Visit: Payer: Self-pay

## 2019-01-21 DIAGNOSIS — F411 Generalized anxiety disorder: Secondary | ICD-10-CM | POA: Diagnosis not present
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
## 2019-02-01 ENCOUNTER — Ambulatory Visit (HOSPITAL_COMMUNITY): Payer: BC Managed Care – PPO | Admitting: Licensed Clinical Social Worker

## 2019-02-04 ENCOUNTER — Ambulatory Visit (INDEPENDENT_AMBULATORY_CARE_PROVIDER_SITE_OTHER): Payer: BC Managed Care – PPO | Admitting: Licensed Clinical Social Worker

## 2019-02-04 ENCOUNTER — Encounter (HOSPITAL_COMMUNITY): Payer: Self-pay | Admitting: Licensed Clinical Social Worker

## 2019-02-04 ENCOUNTER — Other Ambulatory Visit: Payer: Self-pay

## 2019-02-04 DIAGNOSIS — F411 Generalized anxiety disorder: Secondary | ICD-10-CM
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
## 2019-02-12 ENCOUNTER — Ambulatory Visit
Admission: EM | Admit: 2019-02-12 | Discharge: 2019-02-12 | Disposition: A | Discharge status: Home / Self Care | Payer: BC Managed Care – PPO | Attending: Family Medicine | Admitting: Family Medicine

## 2019-02-12 ENCOUNTER — Other Ambulatory Visit: Payer: Self-pay

## 2019-02-12 ENCOUNTER — Encounter: Payer: Self-pay | Admitting: Emergency Medicine

## 2019-02-12 DIAGNOSIS — H81399 Other peripheral vertigo, unspecified ear: Secondary | ICD-10-CM | POA: Diagnosis not present

## 2019-02-12 DIAGNOSIS — Z3202 Encounter for pregnancy test, result negative: Secondary | ICD-10-CM

## 2019-02-12 LAB — PREGNANCY, URINE: Preg Test, Ur: NEGATIVE

## 2019-02-12 MED ORDER — MECLIZINE HCL 25 MG PO TABS: 25.0000 mg | ORAL_TABLET | Freq: Three times a day (TID) | ORAL | 0 refills | Status: DC | PRN

## 2019-02-12 NOTE — ED Provider Notes (Signed)
[facility]    CSN: [id number] Arrival date & time: 02/12/19  0946  History   Chief Complaint Chief Complaint  Patient presents with  . Dizziness    APPT   HPI  23 year old female presents with dizziness.  Patient reports that she had a bout of dizziness on Thursday which subsequently resolved.  She states that she had additional symptoms this morning.  She reports severe dizziness.  She states that she feels unsteady on her feet.  She feels like the room is spinning.  She states that she went to the restroom and had to lay down on the floor because when she stood up she felt extremely dizzy and unsteady on her feet.  She reports associated nausea.  She is very anxious as well.  No fever.  Patient also mentions the fact that she is not having menstrual cycles.  She is on continuous birth control.  She took a home pregnancy test which was negative.  She would like a pregnancy test today.  Denies vomiting.  No other associated symptoms.  No other complaints.  History reviewed and updated as below.  Past Medical History:  Diagnosis Date  . Allergy   . Anxiety   . Depression   . GERD (gastroesophageal reflux disease)    Past Surgical History:  Procedure Laterality Date  . TONSILLECTOMY    . WISDOM TOOTH EXTRACTION      OB History    Gravida  0   Para  0   Term  0   Preterm  0   AB  0   Living  0     SAB  0   TAB  0   Ectopic  0   Multiple  0   Live Births  0            Home Medications    Prior to Admission medications   Medication Sig Start Date End Date Taking? Authorizing Provider  busPIRone (BUSPAR) 15 MG tablet TAKE 1 TABLET (15 MG TOTAL) BY MOUTH 2 (TWO) TIMES DAILY. 12/06/18 03/06/19 Yes Pollak, Wendee Beavers, PA-C  hydrOXYzine (ATARAX/VISTARIL) 10 MG tablet TAKE 1 TABLET BY MOUTH EVERY DAY AS NEEDED 08/06/18  Yes Carles Collet M, PA-C  norethindrone-ethinyl estradiol (JUNEL FE 1/20) 1-20 MG-MCG tablet Take 1 tablet by mouth daily. 11/02/18   Yes Trinna Post, PA-C  ondansetron (ZOFRAN) 4 MG tablet Take 1 tablet (4 mg total) by mouth every 8 (eight) hours as needed for nausea or vomiting. 07/15/18  Yes Carles Collet M, PA-C  meclizine (ANTIVERT) 25 MG tablet Take 1 tablet (25 mg total) by mouth 3 (three) times daily as needed for dizziness. 02/12/19   Coral Spikes, DO   Social History Social History   Tobacco Use  . Smoking status: Former Research scientist (life sciences)  . Smokeless tobacco: Never Used  Substance Use Topics  . Alcohol use: Yes  . Drug use: Yes    Frequency: 7.0 times per week    Types: Marijuana     Allergies   Penicillins and Pollen extract   Review of Systems Review of Systems  Gastrointestinal: Positive for nausea.  Neurological: Positive for dizziness.  Psychiatric/Behavioral: The patient is nervous/anxious.    Physical Exam Triage Vital Signs ED Triage Vitals  Enc Vitals Group     BP 02/12/19 1006 131/81     Pulse Rate 02/12/19 1006 (!) 103     Resp 02/12/19 1006 18     Temp 02/12/19 1006 98 F (  36.7 C)     Temp Source 02/12/19 1006 Oral     SpO2 02/12/19 1006 100 %     Weight 02/12/19 0959 125 lb (56.7 kg)     Height 02/12/19 0959 5\' 3"  (1.6 m)     Head Circumference --      Peak Flow --      Pain Score 02/12/19 0959 0     Pain Loc --      Pain Edu? --      Excl. in [place]? --    Updated Vital Signs BP 131/81 (BP Location: Left Arm)   Pulse (!) 103   Temp 98 F (36.7 C) (Oral)   Resp 18   Ht 5\' 3"  (1.6 m)   Wt 56.7 kg   LMP 12/01/2018 (Approximate)   SpO2 100%   BMI 22.14 kg/m   Visual Acuity Right Eye Distance:   Left Eye Distance:   Bilateral Distance:    Right Eye Near:   Left Eye Near:    Bilateral Near:     Physical Exam Vitals and nursing note reviewed.  Constitutional:      General: She is not in acute distress.    Appearance: Normal appearance. She is not ill-appearing.  HENT:     Head: Normocephalic and atraumatic.  Eyes:     General:        Right eye: No discharge.         Left eye: No discharge.     Extraocular Movements: Extraocular movements intact.     Conjunctiva/sclera: Conjunctivae normal.     Pupils: Pupils are equal, round, and reactive to light.  Cardiovascular:     Rate and Rhythm: Regular rhythm. Tachycardia present.     Heart sounds: No murmur.  Pulmonary:     Effort: Pulmonary effort is normal.     Breath sounds: Normal breath sounds. No wheezing, rhonchi or rales.  Neurological:     Mental Status: She is alert.  Psychiatric:        Behavior: Behavior normal.     Comments: Anxious.    UC Treatments / Results  Labs (all labs ordered are listed, but only abnormal results are displayed) Labs Reviewed  PREGNANCY, URINE    EKG   Radiology No results found.  Procedures Procedures (including critical care time)  Medications Ordered in UC Medications - No data to display  Initial Impression / Assessment and Plan / UC Course  I have reviewed the triage vital signs and the nursing notes.  Pertinent labs & imaging results that were available during my care of the patient were reviewed by me and considered in my medical decision making (see chart for details).    23 year old female presents with vertigo.  Meclizine as directed.  Pregnancy test negative.  Final Clinical Impressions(s) / UC Diagnoses   Final diagnoses:  Peripheral vertigo, unspecified laterality   Discharge Instructions   None    ED Prescriptions    Medication Sig Dispense Auth. Provider   meclizine (ANTIVERT) 25 MG tablet Take 1 tablet (25 mg total) by mouth 3 (three) times daily as needed for dizziness. 30 tablet Coral Spikes, DO     PDMP not reviewed this encounter.   Coral Spikes, [place] 02/12/19 1201

## 2019-02-12 NOTE — ED Triage Notes (Signed)
Pt c/o dizziness. Started about 2 days ago. Went away for one day and came back this morning worse. She states it feels like the room is spinning and floor is moving. Worse when moving positions and laying down. She also has been vomiting today. She also has not had a period in 2 months. She has taken home pregnancy test and have been negative.

## 2019-02-15 ENCOUNTER — Telehealth: Payer: Self-pay

## 2019-02-15 ENCOUNTER — Encounter: Payer: Self-pay | Admitting: Emergency Medicine

## 2019-02-15 ENCOUNTER — Other Ambulatory Visit: Payer: Self-pay

## 2019-02-15 ENCOUNTER — Ambulatory Visit
Admission: EM | Admit: 2019-02-15 | Discharge: 2019-02-15 | Disposition: A | Discharge status: Home / Self Care | Payer: BC Managed Care – PPO | Attending: Family Medicine | Admitting: Family Medicine

## 2019-02-15 DIAGNOSIS — F410 Panic disorder [episodic paroxysmal anxiety] without agoraphobia: Secondary | ICD-10-CM

## 2019-02-15 DIAGNOSIS — F419 Anxiety disorder, unspecified: Secondary | ICD-10-CM | POA: Diagnosis not present

## 2019-02-15 MED ORDER — CLONAZEPAM 0.5 MG PO TABS: 0.5000 mg | ORAL_TABLET | Freq: Two times a day (BID) | ORAL | 0 refills | Status: DC | PRN

## 2019-02-15 NOTE — Discharge Instructions (Signed)
Medication twice daily as needed. Try this evening before using during the day.  See your PCP soon.  Take care  Dr. Lacinda Axon

## 2019-02-15 NOTE — Telephone Encounter (Signed)
I don't have any slots. We can see her tomorrow or if she is comfortable she can see another provider if they have openings.

## 2019-02-15 NOTE — Telephone Encounter (Signed)
Apt is made for 02/16/2019 11:20am  Thanks   -Mickel Baas

## 2019-02-15 NOTE — Telephone Encounter (Signed)
Copied from [place] [id number]. Topic: Appointment Scheduling - Scheduling Inquiry for Clinic >> Feb 15, 2019  1:41 PM Sheran Luz wrote: Patient requesting to be worked in today for anxiety. Patient declined triage. Please advise.

## 2019-02-15 NOTE — Telephone Encounter (Signed)
Can you work her in today?  Thanks,   -Mickel Baas

## 2019-02-15 NOTE — ED Triage Notes (Signed)
Patient in today c/o anxiety. Patient states she can't eat, can't sleep and feeling anxious. Patient states her medication is not helping.

## 2019-02-15 NOTE — ED Provider Notes (Signed)
[facility]    CSN: [id number] Arrival date & time: 02/15/19  1426      History   Chief Complaint Chief Complaint  Patient presents with  . Anxiety   HPI  23 year old female presents with the above complaint.  Patient recently seen by me on 12/12.  Diagnosed with vertigo.  Patient has underlying anxiety.  Patient states that her vertigo has improved.  She is currently feeling well in this regard.  However, since developing vertigo she has had worsening anxiety.  Patient states that she feels very anxious.  She states that her hands are tingling in her legs are shaking.  She feels like she can eat or sleep.  She feels as if she is going to die.  Patient states that her hydroxyzine and BuSpar do not seem to be working.  She is under the care of a counselor as well as a primary care physician.  Patient feels exceedingly anxious.  No relieving factors.  No other reported symptoms.  No other complaints.  PMH, Surgical Hx, Family Hx, Social History reviewed and updated as below.  Past Medical History:  Diagnosis Date  . Allergy   . Anxiety   . Depression   . GERD (gastroesophageal reflux disease)    Past Surgical History:  Procedure Laterality Date  . TONSILLECTOMY    . WISDOM TOOTH EXTRACTION      OB History    Gravida  0   Para  0   Term  0   Preterm  0   AB  0   Living  0     SAB  0   TAB  0   Ectopic  0   Multiple  0   Live Births  0            Home Medications    Prior to Admission medications   Medication Sig Start Date End Date Taking? Authorizing Provider  busPIRone (BUSPAR) 15 MG tablet TAKE 1 TABLET (15 MG TOTAL) BY MOUTH 2 (TWO) TIMES DAILY. 12/06/18 03/06/19 Yes Pollak, Wendee Beavers, PA-C  hydrOXYzine (ATARAX/VISTARIL) 10 MG tablet TAKE 1 TABLET BY MOUTH EVERY DAY AS NEEDED 08/06/18  Yes Carles Collet M, PA-C  meclizine (ANTIVERT) 25 MG tablet Take 1 tablet (25 mg total) by mouth 3 (three) times daily as needed for dizziness.  02/12/19  Yes Coral Spikes, DO  norethindrone-ethinyl estradiol (JUNEL FE 1/20) 1-20 MG-MCG tablet Take 1 tablet by mouth daily. 11/02/18  Yes Trinna Post, PA-C  ondansetron (ZOFRAN) 4 MG tablet Take 1 tablet (4 mg total) by mouth every 8 (eight) hours as needed for nausea or vomiting. 07/15/18  Yes Carles Collet M, PA-C  clonazePAM (KLONOPIN) 0.5 MG tablet Take 1 tablet (0.5 mg total) by mouth 2 (two) times daily as needed for anxiety. 02/15/19   Coral Spikes, DO    Family History Family History  Problem Relation Age of Onset  . Healthy Mother   . Healthy Father     Social History Social History   Tobacco Use  . Smoking status: Former Smoker    Quit date: 02/15/2016    Years since quitting: 3.0  . Smokeless tobacco: Never Used  Substance Use Topics  . Alcohol use: Never  . Drug use: Yes    Frequency: 21.0 times per week    Types: Marijuana    Comment: 3-4 times per day     Allergies   Penicillins and Pollen extract   Review of  Systems Review of Systems  Constitutional: Positive for appetite change.  Psychiatric/Behavioral: Positive for sleep disturbance. The patient is nervous/anxious.    Physical Exam Triage Vital Signs ED Triage Vitals  Enc Vitals Group     BP 02/15/19 1436 122/89     Pulse Rate 02/15/19 1436 (!) 112     Resp 02/15/19 1436 18     Temp 02/15/19 1436 97.9 F (36.6 C)     Temp Source 02/15/19 1436 Oral     SpO2 02/15/19 1436 100 %     Weight 02/15/19 1437 125 lb (56.7 kg)     Height 02/15/19 1437 5\' 3"  (1.6 m)     Head Circumference --      Peak Flow --      Pain Score 02/15/19 1437 0     Pain Loc --      Pain Edu? --      Excl. in [place]? --    Updated Vital Signs BP 122/89 (BP Location: Left Arm)   Pulse (!) 112   Temp 97.9 F (36.6 C) (Oral)   Resp 18   Ht 5\' 3"  (1.6 m)   Wt 56.7 kg   LMP 02/14/2019 (Exact Date)   SpO2 100%   BMI 22.14 kg/m   Visual Acuity Right Eye Distance:   Left Eye Distance:   Bilateral Distance:      Right Eye Near:   Left Eye Near:    Bilateral Near:     Physical Exam Vitals and nursing note reviewed.  Constitutional:      General: She is not in acute distress.    Appearance: She is not ill-appearing.     Comments: Appears very anxious.  HENT:     Head: Normocephalic and atraumatic.  Eyes:     General:        Right eye: No discharge.        Left eye: No discharge.     Conjunctiva/sclera: Conjunctivae normal.  Cardiovascular:     Rate and Rhythm: Regular rhythm. Tachycardia present.     Heart sounds: No murmur.  Pulmonary:     Effort: Pulmonary effort is normal.     Breath sounds: Normal breath sounds. No wheezing, rhonchi or rales.  Skin:    General: Skin is warm.     Findings: No rash.  Neurological:     Mental Status: She is alert.  Psychiatric:        Behavior: Behavior normal.     Comments: Anxious.    UC Treatments / Results  Labs (all labs ordered are listed, but only abnormal results are displayed) Labs Reviewed - No data to display  EKG   Radiology No results found.  Procedures Procedures (including critical care time)  Medications Ordered in UC Medications - No data to display  Initial Impression / Assessment and Plan / UC Course  I have reviewed the triage vital signs and the nursing notes.  Pertinent labs & imaging results that were available during my care of the patient were reviewed by me and considered in my medical decision making (see chart for details).    23 year old female presents with worsening anxiety and panic.  Klonopin as directed.  Advise follow-up with PCP.  Final Clinical Impressions(s) / UC Diagnoses   Final diagnoses:  Panic attack  Anxiety     Discharge Instructions     Medication twice daily as needed. Try this evening before using during the day.  See your PCP soon.  Take  care  Dr. Lacinda Axon    ED Prescriptions    Medication Sig Dispense Auth. Provider   clonazePAM (KLONOPIN) 0.5 MG tablet Take 1  tablet (0.5 mg total) by mouth 2 (two) times daily as needed for anxiety. 30 tablet Coral Spikes, DO     PDMP not reviewed this encounter.   Coral Spikes, [place] 02/15/19 1513

## 2019-02-16 ENCOUNTER — Ambulatory Visit (INDEPENDENT_AMBULATORY_CARE_PROVIDER_SITE_OTHER): Payer: BC Managed Care – PPO | Admitting: Physician Assistant

## 2019-02-16 ENCOUNTER — Other Ambulatory Visit: Payer: Self-pay

## 2019-02-16 ENCOUNTER — Encounter: Payer: Self-pay | Admitting: Physician Assistant

## 2019-02-16 VITALS — BP 124/76 | HR 101 | Temp 96.9°F | Wt 123.0 lb

## 2019-02-16 DIAGNOSIS — F419 Anxiety disorder, unspecified: Secondary | ICD-10-CM

## 2019-02-16 DIAGNOSIS — R42 Dizziness and giddiness: Secondary | ICD-10-CM | POA: Diagnosis not present

## 2019-02-16 MED ORDER — ESCITALOPRAM OXALATE 10 MG PO TABS: 10.0000 mg | ORAL_TABLET | Freq: Every day | ORAL | 0 refills | Status: DC

## 2019-02-16 NOTE — Patient Instructions (Signed)

## 2019-02-16 NOTE — Progress Notes (Signed)
Patient: Amber Wilson Female    DOB: [birth year]   23 y.o.   MRN: [id number] Visit Date: 02/16/2019  Today's Provider: Trinna Post, PA-C   Chief Complaint  Patient presents with  . Anxiety  . Dizziness   Subjective:     Anxiety Presents for follow-up visit. Symptoms include decreased concentration, depressed mood, dizziness, excessive worry, insomnia, nervous/anxious behavior, panic and restlessness. Patient reports no chest pain or nausea.    Dizziness This is a recurrent problem. The problem occurs every several days. The problem has been waxing and waning (Restarted 02/10/2019). Pertinent negatives include no abdominal pain, chest pain, congestion, fatigue, fever, headaches, nausea, sore throat, swollen glands, urinary symptoms or vomiting.   Patient had an episode of vertigo on Thursday that worsened by Saturday to the point where she couldn't get off the floor. She was seen on 02/12/2019 and was given meclizine which did help but made her very drowsy. She reports she tried to go to work on Monday and felt very panicked and had to take off work. She was seen at urgent care for panic attack yesterday on 02/15/2019 and received klonopin 0.5 mg #30. She took two pills of this with some relief.   She has some present concerns about vertigo and would like to be referred to ENT.   Allergies  Allergen Reactions  . Penicillins   . Pollen Extract      Current Outpatient Medications:  .  busPIRone (BUSPAR) 15 MG tablet, TAKE 1 TABLET (15 MG TOTAL) BY MOUTH 2 (TWO) TIMES DAILY., Disp: 60 tablet, Rfl: 2 .  clonazePAM (KLONOPIN) 0.5 MG tablet, Take 1 tablet (0.5 mg total) by mouth 2 (two) times daily as needed for anxiety., Disp: 30 tablet, Rfl: 0 .  hydrOXYzine (ATARAX/VISTARIL) 10 MG tablet, TAKE 1 TABLET BY MOUTH EVERY DAY AS NEEDED, Disp: 30 tablet, Rfl: 0 .  meclizine (ANTIVERT) 25 MG tablet, Take 1 tablet (25 mg total) by mouth 3 (three) times daily as needed for  dizziness., Disp: 30 tablet, Rfl: 0 .  norethindrone-ethinyl estradiol (JUNEL FE 1/20) 1-20 MG-MCG tablet, Take 1 tablet by mouth daily., Disp: 28 tablet, Rfl: 11 .  ondansetron (ZOFRAN) 4 MG tablet, Take 1 tablet (4 mg total) by mouth every 8 (eight) hours as needed for nausea or vomiting., Disp: 20 tablet, Rfl: 0  Review of Systems  Constitutional: Negative.  Negative for fatigue and fever.  HENT: Positive for sinus pressure. Negative for congestion, ear discharge, ear pain, postnasal drip, rhinorrhea, sinus pain, sore throat, tinnitus, trouble swallowing and voice change.   Respiratory: Negative.   Cardiovascular: Negative for chest pain.  Gastrointestinal: Positive for diarrhea. Negative for abdominal distention, abdominal pain, anal bleeding, blood in stool, constipation, nausea, rectal pain and vomiting.  Neurological: Positive for dizziness and light-headedness. Negative for headaches.  Psychiatric/Behavioral: Positive for decreased concentration. The patient is nervous/anxious and has insomnia.     Social History   Tobacco Use  . Smoking status: Former Smoker    Quit date: 02/15/2016    Years since quitting: 3.0  . Smokeless tobacco: Never Used  Substance Use Topics  . Alcohol use: Never      Objective:   BP 124/76 (BP Location: Right Arm, Patient Position: Sitting, Cuff Size: Normal)   Pulse (!) 101   Temp (!) 96.9 F (36.1 C) (Temporal)   Wt 123 lb (55.8 kg)   LMP 02/14/2019 (Exact Date)   BMI 21.79 kg/m  Vitals:  02/16/19 1058  BP: 124/76  Pulse: (!) 101  Temp: (!) 96.9 F (36.1 C)  TempSrc: Temporal  Weight: 123 lb (55.8 kg)  Body mass index is 21.79 kg/m.   Physical Exam Constitutional:      Appearance: Normal appearance.  HENT:     Right Ear: Tympanic membrane and ear canal normal.     Left Ear: Tympanic membrane and ear canal normal.  Cardiovascular:     Rate and Rhythm: Normal rate and regular rhythm.     Heart sounds: Normal heart sounds.   Pulmonary:     Effort: Pulmonary effort is normal.     Breath sounds: Normal breath sounds.  Skin:    General: Skin is warm and dry.  Neurological:     Mental Status: She is alert and oriented to person, place, and time. Mental status is at baseline.  Psychiatric:        Mood and Affect: Mood normal.        Behavior: Behavior normal.      No results found for any visits on 02/16/19.     Assessment & Plan    1. Anxiety  Will add lexapro on top of Buspar 15 mg BID. We have an appointment on 02/18/2019 to recheck this and schedule a follow up. It is OK for her to use the klonopin sparingly but it would be ideal if Lexapro 10 mg daily reduced her overall anxiety so that she did not have to use this frequently.   - escitalopram (LEXAPRO) 10 MG tablet; Take 1 tablet (10 mg total) by mouth daily.  Dispense: 90 tablet; Refill: 0  2. Vertigo  Explained the generally benign nature of vertigo as well as the cyclical nature. It sounds like this is improving. Encouraged to do Epley manuevers. Referral placed below per patient request.  - Ambulatory referral to ENT  The entirety of the information documented in the History of Present Illness, Review of Systems and Physical Exam were personally obtained by me. Portions of this information were initially documented by Ashley Royalty, CMA and reviewed by me for thoroughness and accuracy.       Trinna Post, PA-C  [facility]

## 2019-02-18 ENCOUNTER — Other Ambulatory Visit: Payer: Self-pay

## 2019-02-18 ENCOUNTER — Ambulatory Visit: Payer: BC Managed Care – PPO | Admitting: Physician Assistant

## 2019-02-18 ENCOUNTER — Encounter: Payer: Self-pay | Admitting: Physician Assistant

## 2019-02-18 ENCOUNTER — Ambulatory Visit (HOSPITAL_COMMUNITY): Payer: BC Managed Care – PPO | Admitting: Licensed Clinical Social Worker

## 2019-02-18 VITALS — BP 96/65 | HR 83 | Temp 97.3°F | Resp 16 | Wt 125.4 lb

## 2019-02-18 DIAGNOSIS — F419 Anxiety disorder, unspecified: Secondary | ICD-10-CM | POA: Diagnosis not present

## 2019-02-18 DIAGNOSIS — R42 Dizziness and giddiness: Secondary | ICD-10-CM | POA: Diagnosis not present

## 2019-02-18 NOTE — Progress Notes (Signed)
Amber Wilson  MRN: [id number] DOB: 1995-09-27  Subjective:  HPI   The patient is a 23 year old female who presents for follow up of vertigo.  She is doing better, still has a little bit of it in the morning.  She reports that it gets better during the day.  She also reports that she needs follow up on anxiety.  She reports that on her last visit she would rate her anxiety as an 8, while today she reports is as a 3.  She feels she is doing better and reports she can manage well during the day.  She does state that if she wakes during the night she will have trouble.  She states she feels panicky during the night.. She took one dose of Lexapro 10 mg last night and did not have an issue.   Vertigo episodes are decreasing. Did have an episode after she turned her head over to start her hair.   Reports her right eye has become swollen, irritated and tight which is intermittent. She has an appointment with an eye doctor. Possibly associated with her yoga masks which are cotton. She felt a sensation like an eyelash. She took a benadryl and this improved.   There are no problems to display for this patient.   Past Medical History:  Diagnosis Date  . Allergy   . Anxiety   . Depression   . GERD (gastroesophageal reflux disease)     Social History   Socioeconomic History  . Marital status: Single    Spouse name: Not on file  . Number of children: Not on file  . Years of education: Not on file  . Highest education level: Not on file  Occupational History  . Not on file  Tobacco Use  . Smoking status: Former Smoker    Quit date: 02/15/2016    Years since quitting: 3.0  . Smokeless tobacco: Never Used  Substance and Sexual Activity  . Alcohol use: Never  . Drug use: Yes    Frequency: 21.0 times per week    Types: Marijuana    Comment: 3-4 times per day  . Sexual activity: Yes    Birth control/protection: Pill  Other Topics Concern  . Not on file  Social History Narrative    . Not on file   Social Determinants of Health   Financial Resource Strain:   . Difficulty of Paying Living Expenses: Not on file  Food Insecurity:   . Worried About Charity fundraiser in the Last Year: Not on file  . Ran Out of Food in the Last Year: Not on file  Transportation Needs:   . Lack of Transportation (Medical): Not on file  . Lack of Transportation (Non-Medical): Not on file  Physical Activity:   . Days of Exercise per Week: Not on file  . Minutes of Exercise per Session: Not on file  Stress:   . Feeling of Stress : Not on file  Social Connections:   . Frequency of Communication with Friends and Family: Not on file  . Frequency of Social Gatherings with Friends and Family: Not on file  . Attends Religious Services: Not on file  . Active Member of Clubs or Organizations: Not on file  . Attends Archivist Meetings: Not on file  . Marital Status: Not on file  Intimate Partner Violence:   . Fear of Current or Ex-Partner: Not on file  . Emotionally Abused: Not on file  .  Physically Abused: Not on file  . Sexually Abused: Not on file    Outpatient Encounter Medications as of 02/18/2019  Medication Sig  . busPIRone (BUSPAR) 15 MG tablet TAKE 1 TABLET (15 MG TOTAL) BY MOUTH 2 (TWO) TIMES DAILY.  . clonazePAM (KLONOPIN) 0.5 MG tablet Take 1 tablet (0.5 mg total) by mouth 2 (two) times daily as needed for anxiety.  Marland Kitchen escitalopram (LEXAPRO) 10 MG tablet Take 1 tablet (10 mg total) by mouth daily.  . hydrOXYzine (ATARAX/VISTARIL) 10 MG tablet TAKE 1 TABLET BY MOUTH EVERY DAY AS NEEDED  . meclizine (ANTIVERT) 25 MG tablet Take 1 tablet (25 mg total) by mouth 3 (three) times daily as needed for dizziness.  . norethindrone-ethinyl estradiol (JUNEL FE 1/20) 1-20 MG-MCG tablet Take 1 tablet by mouth daily.  . ondansetron (ZOFRAN) 4 MG tablet Take 1 tablet (4 mg total) by mouth every 8 (eight) hours as needed for nausea or vomiting.   No facility-administered encounter  medications on file as of 02/18/2019.    Allergies  Allergen Reactions  . Penicillins   . Pollen Extract     Review of Systems  Constitutional: Negative for chills, diaphoresis, fever and malaise/fatigue.  HENT: Negative for congestion, ear pain, sinus pain and sore throat.   Respiratory: Negative for cough and shortness of breath.   Cardiovascular: Negative for chest pain.  Gastrointestinal: Negative for abdominal pain and diarrhea.  Musculoskeletal: Negative for myalgias.  Neurological: Positive for dizziness.  Psychiatric/Behavioral: The patient is nervous/anxious.     Objective:  BP 96/65 (BP Location: Left Arm, Patient Position: Sitting, Cuff Size: Normal)   Pulse 83   Temp (!) 97.3 F (36.3 C) (Temporal)   Resp 16   Wt 125 lb 6.4 oz (56.9 kg)   LMP 02/14/2019 (Exact Date)   BMI 22.21 kg/m   Physical Exam  Constitutional: She is well-developed, well-nourished, and in no distress.  Eyes: Pupils are equal, round, and reactive to light. Conjunctivae and EOM are normal. Right eye exhibits no discharge. Left eye exhibits no discharge. Right conjunctiva is not injected. Right conjunctiva has no hemorrhage. Left conjunctiva is not injected. Left conjunctiva has no hemorrhage.  Cardiovascular: Normal rate.  Pulmonary/Chest: Effort normal.    Assessment and Plan :  1. Anxiety  Improving. Continue buspar and lexapro. Follow up 1-2 months. She is continuing with counseling.  2. Vertigo  Present but seems to be improving. ENT referral has been placed.   The entirety of the information documented in the History of Present Illness, Review of Systems and Physical Exam were personally obtained by me. Portions of this information were initially documented by Althea Charon, CMA and reviewed by me for thoroughness and accuracy.

## 2019-02-18 NOTE — Patient Instructions (Signed)

## 2019-03-08 ENCOUNTER — Other Ambulatory Visit: Payer: Self-pay | Admitting: Physician Assistant

## 2019-03-08 DIAGNOSIS — F419 Anxiety disorder, unspecified: Secondary | ICD-10-CM

## 2019-03-11 DIAGNOSIS — H6982 Other specified disorders of Eustachian tube, left ear: Secondary | ICD-10-CM | POA: Diagnosis not present

## 2019-03-11 DIAGNOSIS — H8112 Benign paroxysmal vertigo, left ear: Secondary | ICD-10-CM | POA: Diagnosis not present

## 2019-03-14 ENCOUNTER — Ambulatory Visit (HOSPITAL_COMMUNITY): Payer: BC Managed Care – PPO | Admitting: Licensed Clinical Social Worker

## 2019-03-18 ENCOUNTER — Other Ambulatory Visit: Payer: Self-pay

## 2019-03-18 ENCOUNTER — Ambulatory Visit (HOSPITAL_COMMUNITY): Payer: BC Managed Care – PPO | Admitting: Licensed Clinical Social Worker

## 2019-03-18 ENCOUNTER — Ambulatory Visit (INDEPENDENT_AMBULATORY_CARE_PROVIDER_SITE_OTHER): Payer: BC Managed Care – PPO | Admitting: Licensed Clinical Social Worker

## 2019-03-18 DIAGNOSIS — F411 Generalized anxiety disorder: Secondary | ICD-10-CM

## 2019-03-19 NOTE — Progress Notes (Signed)
Virtual Visit via Video Note  I connected with Amber Wilson on 03/19/19 at 11:00 AM EST by a video enabled telemedicine application and verified that I am speaking with the correct person using two identifiers.  Location: Patient: Home Provider: Office   I discussed the limitations of evaluation and management by telemedicine and the availability of in person appointments. The patient expressed understanding and agreed to proceed.   THERAPIST PROGRESS NOTE  Session Time: 11:00 am-11:45 am  Participation Level: Active  Behavioral Response: CasualAlertAnxious  Type of Therapy: Individual Therapy  Treatment Goals addressed: Coping  Interventions: CBT and Solution Focused  Summary: Amber Wilson is a 24 y.o. female who presents oriented x5 (person, place, situation, time and object), casually dressed, appropriately groomed, average height, average weight, and cooperative to address anxiety. Patient has minimal history of medical treatment. Patient has a history of mental health treatment including outpatient therapy and medication management. Patient denies suicidal and homicidal ideations. Patient denies psychosis including auditory and visual hallucinations. Patient denies substance abuse. Patient is at low risk for lethality at this time.  Physically: Patient has been diagnosed with vertigo in Dec. She has been difficulty adjusting. She had a really bad experience when she discovered she had vertigo with not being able to stand up, throwing up, etc. Her vertigo has triggered panic attacks.  Spiritually/values: No issues identified.  Relationships: Patient's relationships are going well. 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 She has a good relationship with the family but feels like things have run their course. After discussion, patient understood that while she is close to the family she has no obligation to them.   Emotionally/Mentally/Behavior: Patient's mood was ok but her anxiety had increased. Her anxiety has increased since she was diagnosed with vertigo. The yoga studio she was going to has shut down and she feels like she lost some social interaction. Patient is trying manage her vertigo but it is scary for her at times. Patient is also interviewing at a bridal shop and feels like it could be a good change for her.   Patient engaged in session. Patient responded well to interventions. Patient continues to meet criteria for Generalized Anxiety Disorder. Patient will continue in outpatient therapy due to being the least restrictive service to meet her needs. Patient made moderate progress on her goals.   Suicidal/Homicidal: Negativewithout intent/plan  Therapist Response: Therapist reviewed patient's recent thoughts and behaviors. Therapist utilized CBT to address anxiety. Therapist processed patient's feelings to identify triggers for anxiety. Therapist discussed with patient her physical health and anxiety.  Plan: Return again in 3 weeks.  Diagnosis: Axis I: Generalized Anxiety Disorder    Axis II: No diagnosis  I discussed the assessment and treatment plan with the patient. The patient was provided an opportunity to ask questions and all were answered. The patient agreed with the plan and demonstrated an understanding of the instructions.   The patient was advised to call back or seek an in-person evaluation if the symptoms worsen or if the condition fails to improve as anticipated.  I provided 45 minutes of non-face-to-face time during this encounter.  [name], LCSW 03/19/2019

## 2019-03-22 DIAGNOSIS — H8112 Benign paroxysmal vertigo, left ear: Secondary | ICD-10-CM | POA: Diagnosis not present

## 2019-04-01 ENCOUNTER — Other Ambulatory Visit: Payer: Self-pay

## 2019-04-01 ENCOUNTER — Ambulatory Visit (INDEPENDENT_AMBULATORY_CARE_PROVIDER_SITE_OTHER): Payer: BC Managed Care – PPO | Admitting: Licensed Clinical Social Worker

## 2019-04-01 DIAGNOSIS — F411 Generalized anxiety disorder: Secondary | ICD-10-CM | POA: Diagnosis not present

## 2019-04-02 NOTE — Progress Notes (Signed)
Virtual Visit via Video Note  I connected with Amber Wilson on 04/02/19 at 10:00 AM EST by a video enabled telemedicine application and verified that I am speaking with the correct person using two identifiers.  Location: Patient: Home Provider: Office   I discussed the limitations of evaluation and management by telemedicine and the availability of in person appointments. The patient expressed understanding and agreed to proceed.   THERAPIST PROGRESS NOTE  Session Time: 10:00 am-10:45 am  Participation Level: Active  Behavioral Response: CasualAlertAnxious  Type of Therapy: Individual Therapy  Treatment Goals addressed: Coping  Interventions: CBT and Solution Focused  Summary: Amber Wilson is a 24 y.o. female who presents oriented x5 (person, place, situation, time and object), casually dressed, appropriately groomed, average height, average weight, and cooperative to address anxiety. Patient has minimal history of medical treatment. Patient has a history of mental health treatment including outpatient therapy and medication management. Patient denies suicidal and homicidal ideations. Patient denies psychosis including auditory and visual hallucinations. Patient denies substance abuse. Patient is at low risk for lethality at this time.  Physically: Patient's vertigo is under control but she was told that what she is experiencing, a slight rocking when lifting her arms, etc, is not related to vertigo. She was told that it may be vascular or neurological. This "freaked" her out. She was more concerned about the potential of having to get multiple tests to figure out what is occurring. She saw her mother go through years of tests and not have answers to her medical concerns.   Spiritually/values: No issues identified.  Relationships: Patient's relationships are going well. No issues identified.  Emotionally/Mentally/Behavior: Patient's mood was stable but has had some moments of  anxiety related to her health. Patient is looking for a new job. She has several interviews coming up. Patient is excited at the possibility of changing jobs. Patient is debated which job she may take. She has the option for full time work or part time work. If she did part time she would earn less but have more of a life. 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 Patient is leaning toward part time work.   Patient engaged in session. Patient responded well to interventions. Patient continues to meet criteria for Generalized Anxiety Disorder. Patient will continue in outpatient therapy due to being the least restrictive service to meet her needs. Patient made moderate progress on her goals.   Suicidal/Homicidal: Negativewithout intent/plan  Therapist Response: Therapist reviewed patient's recent thoughts and behaviors. Therapist utilized CBT to address anxiety. Therapist processed patient's feelings to identify triggers for anxiety. Therapist discussed with patient her physical health and applying for jobs.   Plan: Return again in 3 weeks.  Diagnosis: Axis I: Generalized Anxiety Disorder    Axis II: No diagnosis  I discussed the assessment and treatment plan with the patient. The patient was provided an opportunity to ask questions and all were answered. The patient agreed with the plan and demonstrated an understanding of the instructions.   The patient was advised to call back or seek an in-person evaluation if the symptoms worsen or if the condition fails to improve as anticipated.  I provided 45 minutes of non-face-to-face time during this encounter.  [name], LCSW 04/02/2019

## 2019-04-15 ENCOUNTER — Ambulatory Visit (HOSPITAL_COMMUNITY): Payer: BC Managed Care – PPO | Admitting: Licensed Clinical Social Worker

## 2019-04-15 DIAGNOSIS — R42 Dizziness and giddiness: Secondary | ICD-10-CM | POA: Diagnosis not present

## 2019-04-22 ENCOUNTER — Ambulatory Visit: Payer: BC Managed Care – PPO | Admitting: Physician Assistant

## 2019-04-22 ENCOUNTER — Ambulatory Visit (INDEPENDENT_AMBULATORY_CARE_PROVIDER_SITE_OTHER): Payer: BC Managed Care – PPO | Admitting: Physician Assistant

## 2019-04-22 ENCOUNTER — Other Ambulatory Visit: Payer: Self-pay

## 2019-04-22 ENCOUNTER — Encounter: Payer: Self-pay | Admitting: Physician Assistant

## 2019-04-22 VITALS — BP 118/68 | HR 79 | Temp 97.1°F | Wt 132.0 lb

## 2019-04-22 DIAGNOSIS — R42 Dizziness and giddiness: Secondary | ICD-10-CM

## 2019-04-22 DIAGNOSIS — D229 Melanocytic nevi, unspecified: Secondary | ICD-10-CM | POA: Diagnosis not present

## 2019-04-22 NOTE — Progress Notes (Signed)
Patient: Amber Wilson Female    DOB: Dec 21, 1995   24 y.o.   MRN: [id number] Visit Date: 04/22/2019  Today's Provider: Trinna Post, PA-C   Chief Complaint  Patient presents with  . Anxiety   Subjective:     HPI  Anxiety Patient presents today for anxiety follow-up. Patient last office visit was on 02/18/2019. Patient is currently taking Lexapro 10 MG and Buspar 15 MG. Patient reports good compliance with treatment. Continues to work as a Surveyor, minerals. She has applied for other positions including at the [employer] in [place].   GAD 7 : Generalized Anxiety Score 04/22/2019 02/16/2019  Nervous, Anxious, on Edge 1 3  Control/stop worrying 2 3  Worry too much - different things 2 3  Trouble relaxing 2 3  Restless 3 3  Easily annoyed or irritable 3 2  Afraid - awful might happen 3 3  Total GAD 7 Score 16 20  Anxiety Difficulty Somewhat difficult Very difficult    She has been to see ENT Brett Fairy, PA-C at [facility] for vertigo and had alignment done and crystals were in place. ENT recommended "vascular imaging" as they reported her crystals were aligned and there were not other interventions.   She reports vertigo like symptoms during certain motions, particularly in yoga when she raises her head or when she is putting something on a shelf.     Allergies  Allergen Reactions  . Penicillins   . Pollen Extract      Current Outpatient Medications:  .  busPIRone (BUSPAR) 15 MG tablet, TAKE 1 TABLET (15 MG TOTAL) BY MOUTH 2 (TWO) TIMES DAILY., Disp: 60 tablet, Rfl: 2 .  escitalopram (LEXAPRO) 10 MG tablet, Take 1 tablet (10 mg total) by mouth daily., Disp: 90 tablet, Rfl: 0 .  ondansetron (ZOFRAN) 4 MG tablet, Take 1 tablet (4 mg total) by mouth every 8 (eight) hours as needed for nausea or vomiting., Disp: 20 tablet, Rfl: 0 .  clonazePAM (KLONOPIN) 0.5 MG tablet, Take 1 tablet (0.5 mg total) by mouth 2 (two) times daily as needed for anxiety.  (Patient not taking: Reported on 04/22/2019), Disp: 30 tablet, Rfl: 0 .  hydrOXYzine (ATARAX/VISTARIL) 10 MG tablet, TAKE 1 TABLET BY MOUTH EVERY DAY AS NEEDED (Patient not taking: Reported on 04/22/2019), Disp: 30 tablet, Rfl: 0 .  meclizine (ANTIVERT) 25 MG tablet, Take 1 tablet (25 mg total) by mouth 3 (three) times daily as needed for dizziness. (Patient not taking: Reported on 04/22/2019), Disp: 30 tablet, Rfl: 0 .  norethindrone-ethinyl estradiol (JUNEL FE 1/20) 1-20 MG-MCG tablet, Take 1 tablet by mouth daily. (Patient not taking: Reported on 04/22/2019), Disp: 28 tablet, Rfl: 11  Review of Systems  Constitutional: Negative.   HENT: Negative.   Respiratory: Negative for chest tightness, shortness of breath and wheezing.   Cardiovascular: Negative.   Hematological: Does not bruise/bleed easily.  Psychiatric/Behavioral: Negative for agitation, behavioral problems, confusion, decreased concentration, self-injury, sleep disturbance and suicidal ideas. The patient is not nervous/anxious.     Social History   Tobacco Use  . Smoking status: Former Smoker    Quit date: 02/15/2016    Years since quitting: 3.1  . Smokeless tobacco: Never Used  Substance Use Topics  . Alcohol use: Never      Objective:   BP 118/68 (BP Location: Left Arm, Patient Position: Sitting, Cuff Size: Normal)   Pulse 79   Temp (!) 97.1 F (36.2 C) (Temporal)  Wt 132 lb (59.9 kg)   SpO2 97%   BMI 23.38 kg/m  Vitals:   04/22/19 1526  BP: 118/68  Pulse: 79  Temp: (!) 97.1 F (36.2 C)  TempSrc: Temporal  SpO2: 97%  Weight: 132 lb (59.9 kg)  Body mass index is 23.38 kg/m.   Physical Exam Constitutional:      Appearance: Normal appearance.  Cardiovascular:     Rate and Rhythm: Normal rate and regular rhythm.     Heart sounds: Normal heart sounds.  Pulmonary:     Effort: Pulmonary effort is normal.     Breath sounds: Normal breath sounds.  Chest:    Skin:    General: Skin is warm and dry.    Neurological:     Mental Status: She is alert and oriented to person, place, and time. Mental status is at baseline.  Psychiatric:        Mood and Affect: Mood normal.        Behavior: Behavior normal.      No results found for any visits on 04/22/19.     Assessment & Plan    1. Dizziness  We will refer to neurology for additional evaluation due to dizziness despite vertigo treatment.  - Ambulatory referral to Neurology  2. Vertigo   3. Atypical mole  Recommend referring to dermatology. Patient will message back with preference of her dermatologist.   The entirety of the information documented in the History of Present Illness, Review of Systems and Physical Exam were personally obtained by me. Portions of this information were initially documented by [facility] and reviewed by me for thoroughness and accuracy.   F/u PRN      Trinna Post, PA-C  [facility]

## 2019-04-22 NOTE — Patient Instructions (Signed)

## 2019-04-29 ENCOUNTER — Ambulatory Visit (HOSPITAL_COMMUNITY): Payer: BC Managed Care – PPO | Admitting: Licensed Clinical Social Worker

## 2019-05-07 ENCOUNTER — Other Ambulatory Visit: Payer: Self-pay | Admitting: Physician Assistant

## 2019-05-07 DIAGNOSIS — F419 Anxiety disorder, unspecified: Secondary | ICD-10-CM

## 2019-05-13 ENCOUNTER — Ambulatory Visit (INDEPENDENT_AMBULATORY_CARE_PROVIDER_SITE_OTHER): Payer: BC Managed Care – PPO | Admitting: Licensed Clinical Social Worker

## 2019-05-13 DIAGNOSIS — F411 Generalized anxiety disorder: Secondary | ICD-10-CM

## 2019-05-15 NOTE — Progress Notes (Signed)
Virtual Visit via Video Note  I connected with Amber Wilson on 05/15/19 at 10:00 AM EST by a video enabled telemedicine application and verified that I am speaking with the correct person using two identifiers.  Location: Patient: Home Provider: Office   I discussed the limitations of evaluation and management by telemedicine and the availability of in person appointments. The patient expressed understanding and agreed to proceed.   THERAPIST PROGRESS NOTE  Session Time: 10:00 am-10:45 am  Participation Level: Active  Behavioral Response: CasualAlertAnxious  Type of Therapy: Individual Therapy  Treatment Goals addressed: Coping  Interventions: CBT and Solution Focused  Case Summary: Amber Wilson is a 24 y.o. female who presents oriented x5 (person, place, situation, time and object), casually dressed, appropriately groomed, average height, average weight, and cooperative to address anxiety. Patient has minimal history of medical treatment. Patient has a history of mental health treatment including outpatient therapy and medication management. Patient denies suicidal and homicidal ideations. Patient denies psychosis including auditory and visual hallucinations. Patient denies substance abuse. Patient is at low risk for lethality at this time.  Physically: Patient went to an ENT to get checked out and had a painful experience. Her vertigo was induced and she felt miserable. Patient said that everything turned out to be "fine" from the ENT perspective and she is going to a neurologist to get her dizzy spells checked out. She was told not to do yoga at this time.   Spiritually/values: No issues identified.  Relationships: Patient's relationships are going well. Patient went to a [employer] and the first night there was an Lexicographer activity. She did not like it and was ready for it to be over. She went back and enjoyed it. Patient realized from the book "The four pillars" she  realized that she has a hard time balancing boundaries and respect with her grandfather. She explained that she is going to get an inheritance from her grandfather. He has said that a pre-nup needs to be in place before they get married. Patient wasn't completely comfortable with this. She understood it logically but felt like it was saying the relationship wouldn't end. Patient's boyfriend is fine with it and thinks it makes sense. Patient understood that there are certain things she will have to tolerate when at her grandfather's home or around him but she can also speak up for her things she values.  Emotionally/Mentally/Behavior: Patient's mood was stable. Patient had interviews and was offered jobs but turned them down because they didn't fit with what she wanted. Patient is holding off on applying until July. 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   Patient engaged in session. Patient responded well to interventions. Patient continues to meet criteria for Generalized Anxiety Disorder. Patient will continue in outpatient therapy due to being the least restrictive service to meet her needs. Patient made moderate progress on her goals.   Suicidal/Homicidal: Negativewithout intent/plan  Therapist Response: Therapist reviewed patient's recent thoughts and behaviors. Therapist utilized CBT to address anxiety. Therapist processed patient's feelings to identify triggers for anxiety. Therapist discussed with patient her physical health and setting boundaries.   Plan: Return again in 3 weeks.  Diagnosis: Axis I: Generalized Anxiety Disorder    Axis II: No diagnosis  I discussed the assessment and treatment plan with the patient. The patient was  provided an opportunity to ask questions and all were answered. The patient agreed with the plan and demonstrated an  understanding of the instructions.   The patient was advised to call back or seek an in-person evaluation if the symptoms worsen or if the condition fails to improve as anticipated.  I provided 45 minutes of non-face-to-face time during this encounter.  [name], LCSW 05/15/2019

## 2019-05-27 ENCOUNTER — Ambulatory Visit (INDEPENDENT_AMBULATORY_CARE_PROVIDER_SITE_OTHER): Payer: BC Managed Care – PPO | Admitting: Licensed Clinical Social Worker

## 2019-05-27 DIAGNOSIS — F411 Generalized anxiety disorder: Secondary | ICD-10-CM | POA: Diagnosis not present
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
## 2019-06-09 ENCOUNTER — Other Ambulatory Visit: Payer: Self-pay | Admitting: Physician Assistant

## 2019-06-09 DIAGNOSIS — F419 Anxiety disorder, unspecified: Secondary | ICD-10-CM

## 2019-06-10 ENCOUNTER — Ambulatory Visit (INDEPENDENT_AMBULATORY_CARE_PROVIDER_SITE_OTHER): Payer: BC Managed Care – PPO | Admitting: Licensed Clinical Social Worker

## 2019-06-10 DIAGNOSIS — F411 Generalized anxiety disorder: Secondary | ICD-10-CM

## 2019-06-12 NOTE — Progress Notes (Signed)
Virtual Visit via Video Note  I connected with Amber Wilson on 06/12/19 at 11:00 AM EDT by a video enabled telemedicine application and verified that I am speaking with the correct person using two identifiers.  Location: Patient: Home Provider: Office   I discussed the limitations of evaluation and management by telemedicine and the availability of in person appointments. The patient expressed understanding and agreed to proceed.   THERAPIST PROGRESS NOTE  Session Time: 11:00 am-11:45 am  Participation Level: Active  Behavioral Response: CasualAlertAnxious  Type of Therapy: Individual Therapy  Treatment Goals addressed: Coping  Interventions: CBT and Solution Focused  Case Summary: Amber Wilson is a 24 y.o. female who presents oriented x5 (person, place, situation, time and object), casually dressed, appropriately groomed, average height, average weight, and cooperative to address anxiety. Patient has minimal history of medical treatment. Patient has a history of mental health treatment including outpatient therapy and medication management. Patient denies suicidal and homicidal ideations. Patient denies psychosis including auditory and visual hallucinations. Patient denies substance abuse. Patient is at low risk for lethality at this time.  Physically: Patient has had to make modifications to her exercise, etc to avoid getting dizzy, etc. .  Spiritually/values: No issues identified.  Relationships: Patient and her boyfriend got into another argument. Patient continues to recognize her own "stuff" that leads to arguments in her relationship but also acknowledging her boyfriend also plays a part. After discussion, patient was aware that she jumps to conclusions/projects expectations on her partner that may or may not be true. Patient is working on bringing awareness to this and changing that behavior.  Emotionally/Mentally/Behavior: Patient's mood was stable. She had an  emergency dental procedure due to a cracked tooth. She had to get a root canal and will eventually need a crown. Patient usually likes to "plan" for her dental appointments but this happened rapidly. Patient got through the appointment and used her headphones to help her.   Patient engaged in session. Patient responded well to interventions. Patient continues to meet criteria for Generalized Anxiety Disorder. Patient will continue in outpatient therapy due to being the least restrictive service to meet her needs. Patient made moderate progress on her goals.   Suicidal/Homicidal: Negativewithout intent/plan  Therapist Response: Therapist reviewed patient's recent thoughts and behaviors. Therapist utilized CBT to address anxiety. Therapist processed patient's feelings to identify triggers for anxiety. Therapist discussed with patient her relationships and getting through anxiety triggering situations.   Plan: Return again in 3 weeks.  Diagnosis: Axis I: Generalized Anxiety Disorder    Axis II: No diagnosis  I discussed the assessment and treatment plan with the patient. The patient was provided an opportunity to ask questions and all were answered. The patient agreed with the plan and demonstrated an understanding of the instructions.   The patient was advised to call back or seek an in-person evaluation if the symptoms worsen or if the condition fails to improve as anticipated.  I provided 45 minutes of non-face-to-face time during this encounter.  [name], LCSW 06/12/2019

## 2019-06-13 ENCOUNTER — Other Ambulatory Visit: Payer: Self-pay | Admitting: Physician Assistant

## 2019-06-13 DIAGNOSIS — F419 Anxiety disorder, unspecified: Secondary | ICD-10-CM

## 2019-06-13 NOTE — Telephone Encounter (Signed)
Requested medication (s) are due for refill today: no  Requested medication (s) are on the active medication list: no  Last refill:  not on current med list  Future visit scheduled: no  Notes to clinic:  Requested med not on current med list    Requested Prescriptions  Pending Prescriptions Disp Refills   busPIRone (BUSPAR) 15 MG tablet [Pharmacy Med Name: BUSPIRONE HCL 15 MG TABLET] 60 tablet 2    Sig: TAKE 1 TABLET (15 MG TOTAL) BY MOUTH 2 (TWO) TIMES DAILY.      Psychiatry: Anxiolytics/Hypnotics - Non-controlled Passed - 06/13/2019  6:37 PM      Passed - Valid encounter within last 6 months    Recent Outpatient Visits           1 month ago [place], Wendee Beavers, [place]   3 months ago [place], Wendee Beavers, [place]   3 months ago [place], [place], [place]   9 months ago [place], [place]   11 months ago Annual physical exam   [place], [place], [place]

## 2019-06-13 NOTE — Telephone Encounter (Signed)
Medication: Apt scheduled 06/15/19 at 4:00p   BusPIRone (BUSPAR) 15 MG tablet [id number]  DISCONTINUED,   Has the patient contacted their pharmacy? Yes  (Agent: If no, request that the patient contact the pharmacy for the refill.) (Agent: If yes, when and what did the pharmacy advise?)  Preferred Pharmacy (with phone number or street name): [id number] - [place], [address]  Phone:  [phone] Fax:  [phone]     Agent: Please be advised that RX refills may take up to 3 business days. We ask that you follow-up with your pharmacy.

## 2019-06-14 NOTE — Telephone Encounter (Signed)
Requested medication (s) are due for refill today: no  Requested medication (s) are on the active medication list: no  Last refill:  05/10/19  Future visit scheduled: yes  Notes to clinic:  not on active med list    Requested Prescriptions  Pending Prescriptions Disp Refills   busPIRone (BUSPAR) 15 MG tablet [Pharmacy Med Name: BUSPIRONE HCL 15 MG TABLET] 60 tablet 2    Sig: TAKE 1 TABLET (15 MG TOTAL) BY MOUTH 2 (TWO) TIMES DAILY.      Psychiatry: Anxiolytics/Hypnotics - Non-controlled Passed - 06/14/2019  7:15 AM      Passed - Valid encounter within last 6 months    Recent Outpatient Visits           1 month ago [place], Wendee Beavers, [place]   3 months ago [place], Wendee Beavers, [place]   3 months ago [employer], [place], [place]   9 months ago [place] Carles Collet M, [place]   11 months ago Annual physical exam   [place], Wendee Beavers, [place]       Future Appointments             Tomorrow Trinna Post, [place], [place]

## 2019-06-15 ENCOUNTER — Ambulatory Visit (INDEPENDENT_AMBULATORY_CARE_PROVIDER_SITE_OTHER): Payer: 59 | Admitting: Physician Assistant

## 2019-06-15 ENCOUNTER — Other Ambulatory Visit: Payer: Self-pay

## 2019-06-15 NOTE — Progress Notes (Signed)
Erroneous encounter

## 2019-06-21 ENCOUNTER — Other Ambulatory Visit: Payer: Self-pay | Admitting: Neurology

## 2019-06-21 DIAGNOSIS — R42 Dizziness and giddiness: Secondary | ICD-10-CM

## 2019-06-24 ENCOUNTER — Telehealth (INDEPENDENT_AMBULATORY_CARE_PROVIDER_SITE_OTHER): Payer: 59 | Admitting: Licensed Clinical Social Worker

## 2019-06-24 DIAGNOSIS — F411 Generalized anxiety disorder: Secondary | ICD-10-CM | POA: Diagnosis not present

## 2019-06-26 NOTE — Progress Notes (Signed)
Virtual Visit via Video Note  I connected with Amber Wilson on 06/26/19 at 11:00 AM EDT by a video enabled telemedicine application and verified that I am speaking with the correct person using two identifiers.  Location: Patient: Home Provider: Office   I discussed the limitations of evaluation and management by telemedicine and the availability of in person appointments. The patient expressed understanding and agreed to proceed.   THERAPIST PROGRESS NOTE  Session Time: 11:00 am-11:45 am  Participation Level: Active  Behavioral Response: CasualAlertAnxious  Type of Therapy: Individual Therapy  Treatment Goals addressed: Coping  Interventions: CBT and Solution Focused  Case Summary: Amber Wilson is a 24 y.o. female who presents oriented x5 (person, place, situation, time and object), casually dressed, appropriately groomed, average height, average weight, and cooperative to address anxiety. Patient has minimal history of medical treatment. Patient has a history of mental health treatment including outpatient therapy and medication management. Patient denies suicidal and homicidal ideations. Patient denies psychosis including auditory and visual hallucinations. Patient denies substance abuse. Patient is at low risk for lethality at this time.  Physically: Patient went to a neurologist and everything came back clear. Patient is still experiencing dizziness. She has no explanation for it. Patient is struggling with her sleep. She never feels rested. Patient feels like she is constantly "lucid dreaming." She can remember her dreams clearly and if she wakes up then falls asleep the dreams pick up where they left off. Patient wants to stop "lucid dreaming." Spiritually/values: No issues identified.  Relationships: Patient's relationship are going well. She has been working on catching herself and not jumping to conclusions. Emotionally/Mentally/Behavior: Patient's mood and anxiety  are stable. She is applying for jobs. She has lots of options. She is already interviewing. Patient feels like she has a "good stress" related to finding jobs.  Patient engaged in session. Patient responded well to interventions. Patient continues to meet criteria for Generalized Anxiety Disorder. Patient will continue in outpatient therapy due to being the least restrictive service to meet her needs. Patient made moderate progress on her goals.   Suicidal/Homicidal: Negativewithout intent/plan  Therapist Response: Therapist reviewed patient's recent thoughts and behaviors. Therapist utilized CBT to address anxiety. Therapist processed patient's feelings to identify triggers for anxiety. Therapist discussed with patient what has gone well and how her sleep impacts her anxiety as well as physical health.  Plan: Return again in 3 weeks.  Diagnosis: Axis I: Generalized Anxiety Disorder    Axis II: No diagnosis  I discussed the assessment and treatment plan with the patient. The patient was provided an opportunity to ask questions and all were answered. The patient agreed with the plan and demonstrated an understanding of the instructions.   The patient was advised to call back or seek an in-person evaluation if the symptoms worsen or if the condition fails to improve as anticipated.  I provided 45 minutes of non-face-to-face time during this encounter.  [name], LCSW 06/26/2019

## 2019-07-08 ENCOUNTER — Ambulatory Visit (INDEPENDENT_AMBULATORY_CARE_PROVIDER_SITE_OTHER): Payer: 59 | Admitting: Licensed Clinical Social Worker

## 2019-07-08 DIAGNOSIS — F411 Generalized anxiety disorder: Secondary | ICD-10-CM | POA: Diagnosis not present

## 2019-07-09 ENCOUNTER — Other Ambulatory Visit: Payer: Self-pay | Admitting: Physician Assistant

## 2019-07-09 DIAGNOSIS — F419 Anxiety disorder, unspecified: Secondary | ICD-10-CM

## 2019-07-09 NOTE — Progress Notes (Signed)
Virtual Visit via Video Note  I connected with Amber Wilson on 07/09/19 at 11:00 AM EDT by a video enabled telemedicine application and verified that I am speaking with the correct person using two identifiers.  Location: Patient: Home Provider: Office   I discussed the limitations of evaluation and management by telemedicine and the availability of in person appointments. The patient expressed understanding and agreed to proceed.   THERAPIST PROGRESS NOTE  Session Time: 11:00 am-11:45 am  Participation Level: Active  Behavioral Response: CasualAlertAnxious  Type of Therapy: Individual Therapy  Treatment Goals addressed: Coping  Interventions: CBT and Solution Focused  Case Summary: Amber Wilson is a 24 y.o. female who presents oriented x5 (person, place, situation, time and object), casually dressed, appropriately groomed, average height, average weight, and cooperative to address anxiety. Patient has minimal history of medical treatment. Patient has a history of mental health treatment including outpatient therapy and medication management. Patient denies suicidal and homicidal ideations. Patient denies psychosis including auditory and visual hallucinations. Patient denies substance abuse. Patient is at low risk for lethality at this time.  Physically: Patient continues to struggle with her lucid dreaming. Patient had a dream where her friend's mother in law was putting patient's friend's baby down for a nap in unsafe ways (blanket, stuffed animal,etc). Patient was distressed about this dream and called her friend to tell her. It turned out the content of her dream was actually occurring. This experience adds to the intensity and difficulty with her dreams.  Spiritually/values: No issues identified.  Relationships: No issues identified. Emotionally/Mentally/Behavior: Patient's mood and anxiety are stable. Patient continues to apply for jobs. 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 Patient is considering working for 2 families to get a total of 40 hours a week. She feels like this will help keep things interesting. Patient has already asked the families should would be a nanny for if she could do this and they are open to it. Patient has decided to stop applying for jobs due to the increase in "options" that are adding to her stress.   Patient engaged in session. Patient responded well to interventions. Patient continues to meet criteria for Generalized Anxiety Disorder. Patient will continue in outpatient therapy due to being the least restrictive service to meet her needs. Patient made moderate progress on her goals.   Suicidal/Homicidal: Negativewithout intent/plan  Therapist Response: Therapist reviewed patient's recent thoughts and behaviors. Therapist utilized CBT to address anxiety. Therapist processed patient's feelings to identify triggers for anxiety. Therapist discussed with patient her decision making process in relation to picking a job and reducing her stress/anxiety over the decision.   Plan: Return again in 3 weeks.  Diagnosis: Axis I: Generalized Anxiety Disorder    Axis II: No diagnosis  I discussed the assessment and treatment plan with the patient. The patient was provided an opportunity to ask questions and all were answered. The patient agreed with the plan and demonstrated an understanding of the instructions.   The patient was advised to call back or seek an in-person evaluation if the symptoms worsen or if the condition fails to improve as anticipated.  I provided 45 minutes of non-face-to-face time during this encounter.  [name], LCSW 07/09/2019

## 2019-07-09 NOTE — Telephone Encounter (Signed)
Requested Prescriptions  Pending Prescriptions Disp Refills  . escitalopram (LEXAPRO) 10 MG tablet [Pharmacy Med Name: ESCITALOPRAM 10 MG TABLET] 90 tablet 1    Sig: TAKE 1 TABLET BY MOUTH EVERY DAY     Psychiatry:  Antidepressants - SSRI Passed - 07/09/2019  9:42 AM      Passed - Valid encounter within last 6 months    Recent Outpatient Visits          3 weeks ago Erroneous encounter - disregard   [facility] [place], Fabio Bering M, [place]   2 months ago [place], [place], [place]   4 months ago [place], [place], [place]   4 months ago [place], [place], [place]   10 months ago [place], [place], [place]

## 2019-07-16 ENCOUNTER — Ambulatory Visit
Admission: RE | Admit: 2019-07-16 | Discharge: 2019-07-16 | Disposition: A | Discharge status: Home / Self Care | Payer: 59 | Source: Ambulatory Visit | Attending: Neurology | Admitting: Neurology

## 2019-07-16 ENCOUNTER — Other Ambulatory Visit: Payer: Self-pay

## 2019-07-16 DIAGNOSIS — R42 Dizziness and giddiness: Secondary | ICD-10-CM

## 2019-07-16 MED ORDER — GADOBENATE DIMEGLUMINE 529 MG/ML IV SOLN
13.0000 mL | Freq: Once | INTRAVENOUS | Status: AC | PRN
  Administered 2019-07-16: 13 mL via INTRAVENOUS

## 2019-08-12 ENCOUNTER — Ambulatory Visit (HOSPITAL_COMMUNITY): Payer: 59 | Admitting: Licensed Clinical Social Worker

## 2019-08-19 ENCOUNTER — Ambulatory Visit (INDEPENDENT_AMBULATORY_CARE_PROVIDER_SITE_OTHER): Payer: No Typology Code available for payment source | Admitting: Licensed Clinical Social Worker

## 2019-08-19 DIAGNOSIS — F411 Generalized anxiety disorder: Secondary | ICD-10-CM

## 2019-08-20 NOTE — Progress Notes (Signed)
   Location: Patient: Office Provider: Office     THERAPIST PROGRESS NOTE  Session Time: 8:00 am-8:45 am  Participation Level: Active  Behavioral Response: CasualAlertAnxious  Type of Therapy: Individual Therapy  Treatment Goals addressed: Coping  Interventions: CBT and Solution Focused  Case Summary: Amber Wilson is a 24 y.o. female who presents oriented x5 (person, place, situation, time and object), casually dressed, appropriately groomed, average height, average weight, and cooperative to address anxiety. Patient has minimal history of medical treatment. Patient has a history of mental health treatment including outpatient therapy and medication management. Patient denies suicidal and homicidal ideations. Patient denies psychosis including auditory and visual hallucinations. Patient denies substance abuse. Patient is at low risk for lethality at this time.  Physically: Patient continues to have disrupted sleep due to lucid dreaming. She is unable to stop having vivid dreams which cause her to wake up exhausted. She also had an MRI to find out about her vertigo. She had prepared herself for the MRI but didn't know she had to get an IV which disrupted her experience but she went through with it.  Spiritually/values: No issues identified.  Relationships: Patient is planning on going on a vacation with her family and brining her boyfriend. She is a little nervous because this is the first family vacation that he has gone on with her family. He has been asking questions about if they will be doing certain activities, and she doesn't have the answers to. This creates some anxiety in her. Patient also noted that her aunt and uncle will be on one of the trips. Patient had a bad experience with her aunt whom is a therapist but "diagnosed" her grandfather's health issues from one conversation with him and wouldn't tell anyone. Her grandfather has health issues and has not disclosed what they  are. She felt that her aunt was out of line and was basically inferring that patient as well as the rest of her family weren't aware enough to know what is wrong with patient's grandfather. Patient is very protective of her grandparents. She is planning on trying to ignore any statements her aunt makes, but Emotionally/Mentally/Behavior: Patient's mood and anxiety are stable. She was a little stressed that she had an IV during her MRI and was not aware of it. Despite the difficulty, she went through with it. Patient has made her decision about work. 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   Patient engaged in session. Patient responded well to interventions. Patient continues to meet criteria for Generalized Anxiety Disorder. Patient will continue in outpatient therapy due to being the least restrictive service to meet her needs. Patient made moderate progress on her goals.   Suicidal/Homicidal: Negativewithout intent/plan  Therapist Response: Therapist reviewed patient's recent thoughts and behaviors. Therapist utilized CBT to address anxiety. Therapist processed patient's feelings to identify triggers for anxiety. Therapist discussed with patient her physical health and her relationships with family.   Plan: Return again in 3 weeks.  Diagnosis: Axis I: Generalized Anxiety Disorder    Axis II: No diagnosis  I  I provided 45 minutes of non-face-to-face time during this encounter.  [name], LCSW 08/20/2019

## 2019-08-26 ENCOUNTER — Ambulatory Visit (HOSPITAL_COMMUNITY): Payer: 59 | Admitting: Licensed Clinical Social Worker

## 2019-09-02 ENCOUNTER — Ambulatory Visit (HOSPITAL_COMMUNITY): Payer: 59 | Admitting: Licensed Clinical Social Worker

## 2019-09-10 ENCOUNTER — Other Ambulatory Visit: Payer: Self-pay | Admitting: Physician Assistant

## 2019-09-10 DIAGNOSIS — F419 Anxiety disorder, unspecified: Secondary | ICD-10-CM

## 2019-09-10 NOTE — Telephone Encounter (Signed)
Requested Prescriptions  Pending Prescriptions Disp Refills  . busPIRone (BUSPAR) 15 MG tablet [Pharmacy Med Name: BUSPIRONE HCL 15 MG TABLET] 180 tablet 0    Sig: TAKE 1 TABLET (15 MG TOTAL) BY MOUTH 2 (TWO) TIMES DAILY.     Psychiatry: Anxiolytics/Hypnotics - Non-controlled Passed - 09/10/2019  8:53 AM      Passed - Valid encounter within last 6 months    Recent Outpatient Visits          2 months ago Erroneous encounter - disregard   [facility] Carles Collet M, [place]   4 months ago [place] Carles Collet M, [place]   6 months ago [place], [place], [place]   6 months ago [place], Wendee Beavers, [place]   1 year ago [place], [place], [place]      Future Appointments            In 6 days Trinna Post, [place], [place]

## 2019-09-16 ENCOUNTER — Other Ambulatory Visit: Payer: Self-pay

## 2019-09-16 ENCOUNTER — Ambulatory Visit (INDEPENDENT_AMBULATORY_CARE_PROVIDER_SITE_OTHER): Payer: No Typology Code available for payment source | Admitting: Licensed Clinical Social Worker

## 2019-09-16 ENCOUNTER — Ambulatory Visit: Payer: 59 | Admitting: Physician Assistant

## 2019-09-16 DIAGNOSIS — F411 Generalized anxiety disorder: Secondary | ICD-10-CM

## 2019-09-18 NOTE — Progress Notes (Signed)
   Location: Patient: Office Provider: Office     THERAPIST PROGRESS NOTE  Session Time:  10:00 am-10:45 am  Participation Level: Active  Behavioral Response: CasualAlertAnxious  Type of Therapy: Individual Therapy  Treatment Goals addressed: Coping  Interventions: CBT and Solution Focused  Case Summary: Amber Wilson is a 24 y.o. female who presents oriented x5 (person, place, situation, time and object), casually dressed, appropriately groomed, average height, average weight, and cooperative to address anxiety. Patient has minimal history of medical treatment. Patient has a history of mental health treatment including outpatient therapy and medication management. Patient denies suicidal and homicidal ideations. Patient denies psychosis including auditory and visual hallucinations. Patient denies substance abuse. Patient is at low risk for lethality at this time.  Physically: Patient found out she is pregnant around the 4th of July. She was nervous at first but feels more at ease. She is worried about what she can and can not put into her body during pregnancy including medications as she wants to continue to manage her anxiety. Patient feels tired due to pregnancy and she is experiencing some nausea.  Spiritually/values: No issues identified.  Relationships: Patient feels like her trips with family went well overall. She had one day during the vacation that she felt like her mother and uncle drank too much and put making dinner on her fiance without really asking him, more so telling him. Patient was a little anxious about that. Also, she got engaged after she found out she was pregnant but they had been discussing it prior to the pregnancy.  Emotionally/Mentally/Behavior: Patient's mood and anxiety are stable. She is experiencing some stress over when to tell her employer that she is pregnant and making sure she has a birth plan in place. Patient understood to wait until after the 12  week mark for pregnancy due to the reduced risk of miscarriage before telling her work. Patient agreed to focus on her body/self and take care of herself first before trying to meet everyone else's needs including her employers.   Patient engaged in session. Patient responded well to interventions. Patient continues to meet criteria for Generalized Anxiety Disorder. Patient will continue in outpatient therapy due to being the least restrictive service to meet her needs. Patient made moderate progress on her goals.   Suicidal/Homicidal: Negativewithout intent/plan  Therapist Response: Therapist reviewed patient's recent thoughts and behaviors. Therapist utilized CBT to address anxiety. Therapist processed patient's feelings to identify triggers for anxiety. Therapist discussed with patient her physical health and focusing on her needs. .   Plan: Return again in 3 weeks.  Diagnosis: Axis I: Generalized Anxiety Disorder    Axis II: No diagnosis  I  I provided 45 minutes of non-face-to-face time during this encounter.  [name], LCSW 09/18/2019

## 2019-09-29 ENCOUNTER — Encounter: Payer: Self-pay | Admitting: Obstetrics and Gynecology

## 2019-09-29 ENCOUNTER — Ambulatory Visit (INDEPENDENT_AMBULATORY_CARE_PROVIDER_SITE_OTHER): Payer: 59 | Admitting: Obstetrics and Gynecology

## 2019-09-29 VITALS — BP 121/82 | HR 83 | Ht 63.0 in | Wt 142.6 lb

## 2019-09-29 DIAGNOSIS — Z8659 Personal history of other mental and behavioral disorders: Secondary | ICD-10-CM | POA: Diagnosis not present

## 2019-09-29 DIAGNOSIS — N912 Amenorrhea, unspecified: Secondary | ICD-10-CM

## 2019-09-29 NOTE — Progress Notes (Signed)
HPI:      [name] is a 24 y.o. G0P0000 who LMP was Patient's last menstrual period was 08/05/2019.  Subjective:   She presents today after missing her menstrual period.  She is approximately 7 weeks estimated gestational age based on LMP.  She has daily nausea and some vomiting.  She is often able to keep liquids down.  She is taking prenatal vitamins. Of significant note patient has a history of panic attacks which are currently controlled using BuSpar and Lexapro.  She feels as if stopping the BuSpar and Lexapro will certainly trigger panic attacks and make daily functioning problematic.    Hx: The following portions of the patient's history were reviewed and updated as appropriate:             She  has a past medical history of Allergy, Anxiety, Depression, and GERD (gastroesophageal reflux disease). She does not have a problem list on file. She  has a past surgical history that includes Tonsillectomy and Wisdom tooth extraction. Her family history includes Healthy in her father and mother. She  reports that she quit smoking about 3 years ago. She has never used smokeless tobacco. She reports current drug use. Frequency: 21.00 times per week. Drug: Marijuana. She reports that she does not drink alcohol. She has a current medication list which includes the following prescription(s): buspirone, escitalopram, ondansetron, multivitamin-prenatal, clonazepam, hydroxyzine, and meclizine. She is allergic to penicillins and pollen extract.       Review of Systems:  Review of Systems  Constitutional: Denied constitutional symptoms, night sweats, recent illness, fatigue, fever, insomnia and weight loss.  Eyes: Denied eye symptoms, eye pain, photophobia, vision change and visual disturbance.  Ears/Nose/Throat/Neck: Denied ear, nose, throat or neck symptoms, hearing loss, nasal discharge, sinus congestion and sore throat.  Cardiovascular: Denied cardiovascular symptoms, arrhythmia, chest  pain/pressure, edema, exercise intolerance, orthopnea and palpitations.  Respiratory: Denied pulmonary symptoms, asthma, pleuritic pain, productive sputum, cough, dyspnea and wheezing.  Gastrointestinal: Denied, gastro-esophageal reflux, melena, nausea and vomiting.  Genitourinary: Denied genitourinary symptoms including symptomatic vaginal discharge, pelvic relaxation issues, and urinary complaints.  Musculoskeletal: Denied musculoskeletal symptoms, stiffness, swelling, muscle weakness and myalgia.  Dermatologic: Denied dermatology symptoms, rash and scar.  Neurologic: Denied neurology symptoms, dizziness, headache, neck pain and syncope.  Psychiatric: Denied psychiatric symptoms, anxiety and depression.  Endocrine: Denied endocrine symptoms including hot flashes and night sweats.   Meds   Current Outpatient Medications on File Prior to Visit  Medication Sig Dispense Refill  . busPIRone (BUSPAR) 15 MG tablet TAKE 1 TABLET (15 MG TOTAL) BY MOUTH 2 (TWO) TIMES DAILY. 180 tablet 0  . escitalopram (LEXAPRO) 10 MG tablet TAKE 1 TABLET BY MOUTH EVERY DAY 90 tablet 1  . ondansetron (ZOFRAN) 4 MG tablet Take 1 tablet (4 mg total) by mouth every 8 (eight) hours as needed for nausea or vomiting. 20 tablet 0  . Prenatal Vit-Fe Fumarate-FA (MULTIVITAMIN-PRENATAL) 27-0.8 MG TABS tablet Take 1 tablet by mouth daily at 12 noon.    . clonazePAM (KLONOPIN) 0.5 MG tablet Take 1 tablet (0.5 mg total) by mouth 2 (two) times daily as needed for anxiety. (Patient not taking: Reported on 04/22/2019) 30 tablet 0  . hydrOXYzine (ATARAX/VISTARIL) 10 MG tablet TAKE 1 TABLET BY MOUTH EVERY DAY AS NEEDED (Patient not taking: Reported on 04/22/2019) 30 tablet 0  . meclizine (ANTIVERT) 25 MG tablet Take 1 tablet (25 mg total) by mouth 3 (three) times daily as needed for dizziness. (Patient not  taking: Reported on 04/22/2019) 30 tablet 0   No current facility-administered medications on file prior to visit.    Objective:      Vitals:   09/29/19 0947  BP: 121/82  Pulse: 83              Urinary pregnancy test positive  Assessment:    G0P0000 There are no problems to display for this patient.    1. Amenorrhea   2. History of panic attacks     Patient better off on medication than off.  Benefits outweigh risks.   Plan:          Prenatal Plan 1.  The patient was given prenatal literature. 2.  She was continued on prenatal vitamins. 3.  A prenatal lab panel was ordered or drawn. 4.  An ultrasound was ordered to better determine an EDC. 5.  A nurse visit was scheduled. 6.  Genetic testing and testing for other inheritable conditions discussed in detail. She will decide in the future whether to have these labs performed. 7.  A general overview of pregnancy testing, visit schedule, ultrasound schedule, and prenatal care was discussed. 8.  COVID and its risks associated with pregnancy, prevention by limiting exposure and use of masks, as well as the risks and benefits of vaccination during pregnancy were discussed in detail.  [facility] policy regarding office and hospital visitation and testing was explained. 9.  Benefits of breast-feeding discussed in detail including both maternal and infant benefits. Ready Set Baby website discussed.   Orders No orders of the defined types were placed in this encounter.   No orders of the defined types were placed in this encounter.     F/U  Return in about 5 weeks (around 11/03/2019). I spent 35 minutes involved in the care of this patient preparing to see the patient by obtaining and reviewing her medical history (including labs, imaging tests and prior procedures), documenting clinical information in the electronic health record (EHR), counseling and coordinating care plans, writing and sending prescriptions, ordering tests or procedures and directly communicating with the patient by discussing pertinent items from her history and physical exam as well as detailing my  assessment and plan as noted above so that she has an informed understanding.  All of her questions were answered.  Finis Bud, M.D. 09/29/2019 10:14 AM

## 2019-10-04 ENCOUNTER — Other Ambulatory Visit: Payer: Self-pay | Admitting: Obstetrics and Gynecology

## 2019-10-04 ENCOUNTER — Ambulatory Visit (INDEPENDENT_AMBULATORY_CARE_PROVIDER_SITE_OTHER): Payer: 59

## 2019-10-04 DIAGNOSIS — Z3687 Encounter for antenatal screening for uncertain dates: Secondary | ICD-10-CM

## 2019-10-04 DIAGNOSIS — Z789 Other specified health status: Secondary | ICD-10-CM | POA: Diagnosis not present

## 2019-10-04 DIAGNOSIS — Z3A08 8 weeks gestation of pregnancy: Secondary | ICD-10-CM

## 2019-10-10 ENCOUNTER — Other Ambulatory Visit: Payer: Self-pay

## 2019-10-10 ENCOUNTER — Ambulatory Visit (INDEPENDENT_AMBULATORY_CARE_PROVIDER_SITE_OTHER): Payer: No Typology Code available for payment source | Admitting: Licensed Clinical Social Worker

## 2019-10-10 DIAGNOSIS — F411 Generalized anxiety disorder: Secondary | ICD-10-CM

## 2019-10-10 NOTE — Progress Notes (Signed)
   Location: Patient: Office Provider: Office     THERAPIST PROGRESS NOTE  Session Time:  10:00 am-10:45 am  Participation Level: Active  Behavioral Response: CasualAlertAnxious  Type of Therapy: Individual Therapy  Treatment Goals addressed: Coping  Interventions: CBT and Solution Focused  Case Summary: Amber Wilson is a 24 y.o. female who presents oriented x5 (person, place, situation, time and object), casually dressed, appropriately groomed, average height, average weight, and cooperative to address anxiety. Patient has minimal history of medical treatment. Patient has a history of mental health treatment including outpatient therapy and medication management. Patient denies suicidal and homicidal ideations. Patient denies psychosis including auditory and visual hallucinations. Patient denies substance abuse. Patient is at low risk for lethality at this time.  Physically: Patient found out she is having twins which was a bit of a shock. She has been tired and nauseous. She has been sleeping more during the day. Her dreams are more vivid.   Spiritually/values: No issues identified.  Relationships: Patient's relationships are going well. Her fiance is supportive and present. He changed jobs prior to finding out they were having twins.  Emotionally/Mentally/Behavior: Patient's mood and anxiety are stable. 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 Patient at first said that she was shutting down and not caring about these things due to her lack of control but it was reframed that she was accepting things for what they are. She is trying to accept things as well as remember she can't get too upset due to her  pregnancy. Patient is taking care of herself mentally and physically.   Patient engaged in session. Patient responded well to interventions. Patient continues to meet criteria for Generalized Anxiety Disorder. Patient will continue in outpatient therapy due to being the least restrictive service to meet her needs. Patient made moderate progress on her goals.   Suicidal/Homicidal: Negativewithout intent/plan  Therapist Response: Therapist reviewed patient's recent thoughts and behaviors. Therapist utilized CBT to address anxiety. Therapist processed patient's feelings to identify triggers for anxiety. Therapist discussed with patient her pregnancy and accepting things she has no control over.  .   Plan: Return again in 3 weeks.  Diagnosis: Axis I: Generalized Anxiety Disorder    Axis II: No diagnosis    I provided 45 minutes of non-face-to-face time during this encounter.  [name], LCSW 10/10/2019

## 2019-10-17 ENCOUNTER — Other Ambulatory Visit: Payer: Self-pay

## 2019-10-17 ENCOUNTER — Ambulatory Visit (INDEPENDENT_AMBULATORY_CARE_PROVIDER_SITE_OTHER): Payer: 59

## 2019-10-17 VITALS — BP 104/65 | HR 98 | Ht 63.0 in | Wt 140.7 lb

## 2019-10-17 DIAGNOSIS — Z3401 Encounter for supervision of normal first pregnancy, first trimester: Secondary | ICD-10-CM | POA: Diagnosis not present

## 2019-10-17 DIAGNOSIS — Z113 Encounter for screening for infections with a predominantly sexual mode of transmission: Secondary | ICD-10-CM | POA: Diagnosis not present

## 2019-10-17 DIAGNOSIS — Z3A11 11 weeks gestation of pregnancy: Secondary | ICD-10-CM | POA: Diagnosis not present

## 2019-10-17 NOTE — Progress Notes (Signed)
      [name] presents for NOB nurse intake visit. Pregnancy confirmation done at [facility], 09/29/2019, with Harlin Heys, MD  G1.  P0.  LMP 08/05/2019.  EDD 05/11/2020. Baby A EDD 05/12/2020 Baby B EDD 05/13/2020.  Ga [redacted]w[redacted]d. Pregnancy education material explained and given.  0 cats in the home. 2 dogs in the home.  NOB labs ordered. BMI less than 30. TSH/HbgA1c not ordered. Sickle cell not ordered due to race. HIV and drug screen explained and declined. Genetic screening discussed. Genetic testing; Unsure. Pt to discuss genetic testing with provider. PNV encouraged. Pt to follow up with provider in 2 weeks for NOB physical. [place], Consent for HIV/Drug screening reviewed and signed by patient. Pt declined HIV/Drug Screening.

## 2019-10-17 NOTE — Patient Instructions (Signed)
WHAT OB PATIENTS CAN EXPECT   Confirmation of pregnancy and ultrasound ordered if medically indicated-[redacted] weeks gestation  New OB (NOB) intake with nurse and New OB (NOB) labs- [redacted] weeks gestation  New OB (NOB) physical examination with provider- 11/[redacted] weeks gestation  Flu vaccine-[redacted] weeks gestation  Anatomy scan-[redacted] weeks gestation  Glucose tolerance test, blood work to test for anemia, T-dap vaccine-[redacted] weeks gestation  Vaginal swabs/cultures-STD/Group B strep-[redacted] weeks gestation  Appointments every 4 weeks until 28 weeks  Every 2 weeks from 28 weeks until 36 weeks  Weekly visits from 36 weeks until delivery  Morning Sickness  Morning sickness is when you feel sick to your stomach (nauseous) during pregnancy. You may feel sick to your stomach and throw up (vomit). You may feel sick in the morning, but you can feel this way at any time of day. Some women feel very sick to their stomach and cannot stop throwing up (hyperemesis gravidarum). Follow these instructions at home: Medicines  Take over-the-counter and prescription medicines only as told by your doctor. Do not take any medicines until you talk with your doctor about them first.  Taking multivitamins before getting pregnant can stop or lessen the harshness of morning sickness. Eating and drinking  Eat dry toast or crackers before getting out of bed.  Eat 5 or 6 small meals a day.  Eat dry and bland foods like rice and baked potatoes.  Do not eat greasy, fatty, or spicy foods.  Have someone cook for you if the smell of food causes you to feel sick or throw up.  If you feel sick to your stomach after taking prenatal vitamins, take them at night or with a snack.  Eat protein when you need a snack. Nuts, yogurt, and cheese are good choices.  Drink fluids throughout the day.  Try ginger ale made with real ginger, ginger tea made from fresh grated ginger, or ginger candies. General instructions  Do not use any products  that have nicotine or tobacco in them, such as cigarettes and e-cigarettes. If you need help quitting, ask your doctor.  Use an air purifier to keep the air in your house free of smells.  Get lots of fresh air.  Try to avoid smells that make you feel sick.  Try: ? Wearing a bracelet that is used for seasickness (acupressure wristband). ? Going to a doctor who puts thin needles into certain body points (acupuncture) to improve how you feel. Contact a doctor if:  You need medicine to feel better.  You feel dizzy or light-headed.  You are losing weight. Get help right away if:  You feel very sick to your stomach and cannot stop throwing up.  You pass out (faint).  You have very bad pain in your belly. Summary  Morning sickness is when you feel sick to your stomach (nauseous) during pregnancy.  You may feel sick in the morning, but you can feel this way at any time of day.  Making some changes to what you eat may help your symptoms go away. This information is not intended to replace advice given to you by your health care provider. Make sure you discuss any questions you have with your health care provider. Document Revised: 01/30/2017 Document Reviewed: 03/20/2016 Elsevier Patient Education  2020 [employer] How a Baby Grows During Pregnancy  Pregnancy begins when a female's sperm enters a female's egg (fertilization). Fertilization usually happens in one of the tubes (fallopian tubes) that connect the ovaries to the   womb (uterus). The fertilized egg moves down the fallopian tube to the uterus. Once it reaches the uterus, it implants into the lining of the uterus and begins to grow. For the first 10 weeks, the fertilized egg is called an embryo. After 10 weeks, it is called a fetus. As the fetus continues to grow, it receives oxygen and nutrients through tissue (placenta) that grows to support the developing baby. The placenta is the life support system for the baby. It provides  oxygen and nutrition and removes waste. Learning as much as you can about your pregnancy and how your baby is developing can help you enjoy the experience. It can also make you aware of when there might be a problem and when to ask questions. How long does a typical pregnancy last? A pregnancy usually lasts 280 days, or about 40 weeks. Pregnancy is divided into three periods of growth, also called trimesters:  First trimester: 0-12 weeks.  Second trimester: 13-27 weeks.  Third trimester: 28-40 weeks. The day when your baby is ready to be born (full term) is your estimated date of delivery. How does my baby develop month by month? First month  The fertilized egg attaches to the inside of the uterus.  Some cells will form the placenta. Others will form the fetus.  The arms, legs, brain, spinal cord, lungs, and heart begin to develop.  At the end of the first month, the heart begins to beat. Second month  The bones, inner ear, eyelids, hands, and feet form.  The genitals develop.  By the end of 8 weeks, all major organs are developing. Third month  All of the internal organs are forming.  Teeth develop below the gums.  Bones and muscles begin to grow. The spine can flex.  The skin is transparent.  Fingernails and toenails begin to form.  Arms and legs continue to grow longer, and hands and feet develop.  The fetus is about 3 inches (7.6 cm) long. Fourth month  The placenta is completely formed.  The external sex organs, neck, outer ear, eyebrows, eyelids, and fingernails are formed.  The fetus can hear, swallow, and move its arms and legs.  The kidneys begin to produce urine.  The skin is covered with a white, waxy coating (vernix) and very fine hair (lanugo). Fifth month  The fetus moves around more and can be felt for the first time (quickening).  The fetus starts to sleep and wake up and may begin to suck its finger.  The nails grow to the end of the  fingers.  The organ in the digestive system that makes bile (gallbladder) functions and helps to digest nutrients.  If your baby is a girl, eggs are present in her ovaries. If your baby is a boy, testicles start to move down into his scrotum. Sixth month  The lungs are formed.  The eyes open. The brain continues to develop.  Your baby has fingerprints and toe prints. Your baby's hair grows thicker.  At the end of the second trimester, the fetus is about 9 inches (22.9 cm) long. Seventh month  The fetus kicks and stretches.  The eyes are developed enough to sense changes in light.  The hands can make a grasping motion.  The fetus responds to sound. Eighth month  All organs and body systems are fully developed and functioning.  Bones harden, and taste buds develop. The fetus may hiccup.  Certain areas of the brain are still developing. The skull remains soft.   Ninth month  The fetus gains about  lb (0.23 kg) each week.  The lungs are fully developed.  Patterns of sleep develop.  The fetus's head typically moves into a head-down position (vertex) in the uterus to prepare for birth.  The fetus weighs 6-9 lb (2.72-4.08 kg) and is 19-20 inches (48.26-50.8 cm) long. What can I do to have a healthy pregnancy and help my baby develop? General instructions  Take prenatal vitamins as directed by your health care provider. These include vitamins such as folic acid, iron, calcium, and vitamin D. They are important for healthy development.  Take medicines only as directed by your health care provider. Read labels and ask a pharmacist or your health care provider whether over-the-counter medicines, supplements, and prescription drugs are safe to take during pregnancy.  Keep all follow-up visits as directed by your health care provider. This is important. Follow-up visits include prenatal care and screening tests. How do I know if my baby is developing well? At each prenatal visit,  your health care provider will do several different tests to check on your health and keep track of your baby's development. These include:  Fundal height and position. ? Your health care provider will measure your growing belly from your pubic bone to the top of the uterus using a tape measure. ? Your health care provider will also feel your belly to determine your baby's position.  Heartbeat. ? An ultrasound in the first trimester can confirm pregnancy and show a heartbeat, depending on how far along you are. ? Your health care provider will check your baby's heart rate at every prenatal visit.  Second trimester ultrasound. ? This ultrasound checks your baby's development. It also may show your baby's gender. What should I do if I have concerns about my baby's development? Always talk with your health care provider about any concerns that you may have about your pregnancy and your baby. Summary  A pregnancy usually lasts 280 days, or about 40 weeks. Pregnancy is divided into three periods of growth, also called trimesters.  Your health care provider will monitor your baby's growth and development throughout your pregnancy.  Follow your health care provider's recommendations about taking prenatal vitamins and medicines during your pregnancy.  Talk with your health care provider if you have any concerns about your pregnancy or your developing baby. This information is not intended to replace advice given to you by your health care provider. Make sure you discuss any questions you have with your health care provider. Document Revised: 06/10/2018 Document Reviewed: 12/31/2016 Elsevier Patient Education  2020 [employer] First Trimester of Pregnancy  The first trimester of pregnancy is from week 1 until the end of week 13 (months 1 through 3). During this time, your baby will begin to develop inside you. At 6-8 weeks, the eyes and face are formed, and the heartbeat can be seen on  ultrasound. At the end of 12 weeks, all the baby's organs are formed. Prenatal care is all the medical care you receive before the birth of your baby. Make sure you get good prenatal care and follow all of your doctor's instructions. Follow these instructions at home: Medicines  Take over-the-counter and prescription medicines only as told by your doctor. Some medicines are safe and some medicines are not safe during pregnancy.  Take a prenatal vitamin that contains at least 600 micrograms (mcg) of folic acid.  If you have trouble pooping (constipation), take medicine that will make your stool soft (stool   softener) if your doctor approves. Eating and drinking   Eat regular, healthy meals.  Your doctor will tell you the amount of weight gain that is right for you.  Avoid raw meat and uncooked cheese.  If you feel sick to your stomach (nauseous) or throw up (vomit): ? Eat 4 or 5 small meals a day instead of 3 large meals. ? Try eating a few soda crackers. ? Drink liquids between meals instead of during meals.  To prevent constipation: ? Eat foods that are high in fiber, like fresh fruits and vegetables, whole grains, and beans. ? Drink enough fluids to keep your pee (urine) clear or pale yellow. Activity  Exercise only as told by your doctor. Stop exercising if you have cramps or pain in your lower belly (abdomen) or low back.  Do not exercise if it is too hot, too humid, or if you are in a place of great height (high altitude).  Try to avoid standing for long periods of time. Move your legs often if you must stand in one place for a long time.  Avoid heavy lifting.  Wear low-heeled shoes. Sit and stand up straight.  You can have sex unless your doctor tells you not to. Relieving pain and discomfort  Wear a good support bra if your breasts are sore.  Take warm water baths (sitz baths) to soothe pain or discomfort caused by hemorrhoids. Use hemorrhoid cream if your doctor says  it is okay.  Rest with your legs raised if you have leg cramps or low back pain.  If you have puffy, bulging veins (varicose veins) in your legs: ? Wear support hose or compression stockings as told by your doctor. ? Raise (elevate) your feet for 15 minutes, 3-4 times a day. ? Limit salt in your food. Prenatal care  Schedule your prenatal visits by the twelfth week of pregnancy.  Write down your questions. Take them to your prenatal visits.  Keep all your prenatal visits as told by your doctor. This is important. Safety  Wear your seat belt at all times when driving.  Make a list of emergency phone numbers. The list should include numbers for family, friends, the hospital, and police and fire departments. General instructions  Ask your doctor for a referral to a local prenatal class. Begin classes no later than at the start of month 6 of your pregnancy.  Ask for help if you need counseling or if you need help with nutrition. Your doctor can give you advice or tell you where to go for help.  Do not use hot tubs, steam rooms, or saunas.  Do not douche or use tampons or scented sanitary pads.  Do not cross your legs for long periods of time.  Avoid all herbs and alcohol. Avoid drugs that are not approved by your doctor.  Do not use any tobacco products, including cigarettes, chewing tobacco, and electronic cigarettes. If you need help quitting, ask your doctor. You may get counseling or other support to help you quit.  Avoid cat litter boxes and soil used by cats. These carry germs that can cause birth defects in the baby and can cause a loss of your baby (miscarriage) or stillbirth.  Visit your dentist. At home, brush your teeth with a soft toothbrush. Be gentle when you floss. Contact a doctor if:  You are dizzy.  You have mild cramps or pressure in your lower belly.  You have a nagging pain in your belly area.  You   continue to feel sick to your stomach, you throw up, or  you have watery poop (diarrhea).  You have a bad smelling fluid coming from your vagina.  You have pain when you pee (urinate).  You have increased puffiness (swelling) in your face, hands, legs, or ankles. Get help right away if:  You have a fever.  You are leaking fluid from your vagina.  You have spotting or bleeding from your vagina.  You have very bad belly cramping or pain.  You gain or lose weight rapidly.  You throw up blood. It may look like coffee grounds.  You are around people who have German measles, fifth disease, or chickenpox.  You have a very bad headache.  You have shortness of breath.  You have any kind of trauma, such as from a fall or a car accident. Summary  The first trimester of pregnancy is from week 1 until the end of week 13 (months 1 through 3).  To take care of yourself and your unborn baby, you will need to eat healthy meals, take medicines only if your doctor tells you to do so, and do activities that are safe for you and your baby.  Keep all follow-up visits as told by your doctor. This is important as your doctor will have to ensure that your baby is healthy and growing well. This information is not intended to replace advice given to you by your health care provider. Make sure you discuss any questions you have with your health care provider. Document Revised: 06/10/2018 Document Reviewed: 02/26/2016 Elsevier Patient Education  2020 [employer] Commonly Asked Questions During Pregnancy  Cats: A parasite can be excreted in cat feces.  To avoid exposure you need to have another person empty the little box.  If you must empty the litter box you will need to wear gloves.  Wash your hands after handling your cat.  This parasite can also be found in raw or undercooked meat so this should also be avoided.  Colds, Sore Throats, Flu: Please check your medication sheet to see what you can take for symptoms.  If your symptoms are unrelieved by these  medications please call the office.  Dental Work: Most any dental work your dentist recommends is permitted.  X-rays should only be taken during the first trimester if absolutely necessary.  Your abdomen should be shielded with a lead apron during all x-rays.  Please notify your provider prior to receiving any x-rays.  Novocaine is fine; gas is not recommended.  If your dentist requires a note from us prior to dental work please call the office and we will provide one for you.  Exercise: Exercise is an important part of staying healthy during your pregnancy.  You may continue most exercises you were accustomed to prior to pregnancy.  Later in your pregnancy you will most likely notice you have difficulty with activities requiring balance like riding a bicycle.  It is important that you listen to your body and avoid activities that put you at a higher risk of falling.  Adequate rest and staying well hydrated are a must!  If you have questions about the safety of specific activities ask your provider.    Exposure to Children with illness: Try to avoid obvious exposure; report any symptoms to us when noted,  If you have chicken pos, red measles or mumps, you should be immune to these diseases.   Please do not take any vaccines while pregnant unless you have checked   with your OB provider.  Fetal Movement: After 28 weeks we recommend you do "kick counts" twice daily.  Lie or sit down in a calm quiet environment and count your baby movements "kicks".  You should feel your baby at least 10 times per hour.  If you have not felt 10 kicks within the first hour get up, walk around and have something sweet to eat or drink then repeat for an additional hour.  If count remains less than 10 per hour notify your provider.  Fumigating: Follow your pest control agent's advice as to how long to stay out of your home.  Ventilate the area well before re-entering.  Hemorrhoids:   Most over-the-counter preparations can be used  during pregnancy.  Check your medication to see what is safe to use.  It is important to use a stool softener or fiber in your diet and to drink lots of liquids.  If hemorrhoids seem to be getting worse please call the office.   Hot Tubs:  Hot tubs Jacuzzis and saunas are not recommended while pregnant.  These increase your internal body temperature and should be avoided.  Intercourse:  Sexual intercourse is safe during pregnancy as long as you are comfortable, unless otherwise advised by your provider.  Spotting may occur after intercourse; report any bright red bleeding that is heavier than spotting.  Labor:  If you know that you are in labor, please go to the hospital.  If you are unsure, please call the office and let us help you decide what to do.  Lifting, straining, etc:  If your job requires heavy lifting or straining please check with your provider for any limitations.  Generally, you should not lift items heavier than that you can lift simply with your hands and arms (no back muscles)  Painting:  Paint fumes do not harm your pregnancy, but may make you ill and should be avoided if possible.  Latex or water based paints have less odor than oils.  Use adequate ventilation while painting.  Permanents & Hair Color:  Chemicals in hair dyes are not recommended as they cause increase hair dryness which can increase hair loss during pregnancy.  " Highlighting" and permanents are allowed.  Dye may be absorbed differently and permanents may not hold as well during pregnancy.  Sunbathing:  Use a sunscreen, as skin burns easily during pregnancy.  Drink plenty of fluids; avoid over heating.  Tanning Beds:  Because their possible side effects are still unknown, tanning beds are not recommended.  Ultrasound Scans:  Routine ultrasounds are performed at approximately 20 weeks.  You will be able to see your baby's general anatomy an if you would like to know the gender this can usually be determined as  well.  If it is questionable when you conceived you may also receive an ultrasound early in your pregnancy for dating purposes.  Otherwise ultrasound exams are not routinely performed unless there is a medical necessity.  Although you can request a scan we ask that you pay for it when conducted because insurance does not cover " patient request" scans.  Work: If your pregnancy proceeds without complications you may work until your due date, unless your physician or employer advises otherwise.  Round Ligament Pain/Pelvic Discomfort:  Sharp, shooting pains not associated with bleeding are fairly common, usually occurring in the second trimester of pregnancy.  They tend to be worse when standing up or when you remain standing for long periods of time.  These are   the result of pressure of certain pelvic ligaments called "round ligaments".  Rest, Tylenol and heat seem to be the most effective relief.  As the womb and fetus grow, they rise out of the pelvis and the discomfort improves.  Please notify the office if your pain seems different than that described.  It may represent a more serious condition.  Common Medications Safe in Pregnancy  Acne:      Constipation:  Benzoyl Peroxide     Colace  Clindamycin      Dulcolax Suppository  Topica Erythromycin     Fibercon  Salicylic Acid      Metamucil         Miralax AVOID:        Senakot   Accutane    Cough:  Retin-A       Cough Drops  Tetracycline      Phenergan w/ Codeine if Rx  Minocycline      Robitussin (Plain & DM)  Antibiotics:     Crabs/Lice:  Ceclor       RID  Cephalosporins    AVOID:  E-Mycins      Kwell  Keflex  Macrobid/Macrodantin   Diarrhea:  Penicillin      Kao-Pectate  Zithromax      Imodium AD         PUSH FLUIDS AVOID:       Cipro     Fever:  Tetracycline      Tylenol (Regular or Extra  Minocycline       Strength)  Levaquin      Extra Strength-Do not          Exceed 8 tabs/24 hrs Caffeine:        <200mg/day (equiv. To 1 cup  of coffee or  approx. 3 12 oz sodas)         Gas: Cold/Hayfever:       Gas-X  Benadryl      Mylicon  Claritin       Phazyme  **Claritin-D        Chlor-Trimeton    Headaches:  Dimetapp      ASA-Free Excedrin  Drixoral-Non-Drowsy     Cold Compress  Mucinex (Guaifenasin)     Tylenol (Regular or Extra  Sudafed/Sudafed-12 Hour     Strength)  **Sudafed PE Pseudoephedrine   Tylenol Cold & Sinus     Vicks Vapor Rub  Zyrtec  **AVOID if Problems With Blood Pressure         Heartburn: Avoid lying down for at least 1 hour after meals  Aciphex      Maalox     Rash:  Milk of Magnesia     Benadryl    Mylanta       1% Hydrocortisone Cream  Pepcid  Pepcid Complete   Sleep Aids:  Prevacid      Ambien   Prilosec       Benadryl  Rolaids       Chamomile Tea  Tums (Limit 4/day)     Unisom         Tylenol PM         Warm milk-add vanilla or  Hemorrhoids:       Sugar for taste  Anusol/Anusol H.C.  (RX: Analapram 2.5%)  Sugar Substitutes:  Hydrocortisone OTC     Ok in moderation  Preparation H      Tucks        Vaseline lotion applied to tissue with wiping    Herpes:       Throat:  Acyclovir      Oragel  Famvir  Valtrex     Vaccines:         Flu Shot Leg Cramps:       *Gardasil  Benadryl      Hepatitis A         Hepatitis B Nasal Spray:       Pneumovax  Saline Nasal Spray     Polio Booster         Tetanus Nausea:       Tuberculosis test or PPD  Vitamin B6 25 mg TID   AVOID:    Dramamine      *Gardasil  Emetrol       Live Poliovirus  Ginger Root 250 mg QID    MMR (measles, mumps &  High Complex Carbs @ Bedtime    rebella)  Sea Bands-Accupressure    Varicella (Chickenpox)  Unisom 1/2 tab TID     *No known complications           If received before Pain:         Known pregnancy;   Darvocet       Resume series after  Lortab        Delivery  Percocet    Yeast:   Tramadol      Femstat  Tylenol 3      Gyne-lotrimin  Ultram       Monistat  Vicodin           MISC:         All  Sunscreens           Hair Coloring/highlights          Insect Repellant's          (Including DEET)         Mystic Tans  

## 2019-10-18 LAB — GC/CHLAMYDIA PROBE AMP
Chlamydia trachomatis, NAA: NEGATIVE
Neisseria Gonorrhoeae by PCR: NEGATIVE

## 2019-10-18 LAB — TOXOPLASMA ANTIBODIES- IGG AND  IGM
Toxoplasma Antibody- IgM: 3.9 AU/mL (ref 0.0–7.9)
Toxoplasma IgG Ratio: <3 IU/mL (ref 0.0–7.1)

## 2019-10-18 LAB — VARICELLA ZOSTER ANTIBODY, IGG: Varicella zoster IgG: <135 index — ABNORMAL LOW (ref >165)

## 2019-10-18 LAB — ABO AND RH: Rh Factor: POSITIVE

## 2019-10-18 LAB — RPR: RPR Ser Ql: NONREACTIVE

## 2019-10-18 LAB — HEPATITIS B SURFACE ANTIGEN: Hepatitis B Surface Ag: NEGATIVE

## 2019-10-18 LAB — RUBELLA SCREEN: Rubella Antibodies, IGG: 8.17 index (ref >0.99)

## 2019-10-18 LAB — ANTIBODY SCREEN: Antibody Screen: NEGATIVE

## 2019-10-19 LAB — URINALYSIS, ROUTINE W REFLEX MICROSCOPIC
Bilirubin, UA: NEGATIVE
Glucose, UA: NEGATIVE
Leukocytes,UA: NEGATIVE
Nitrite, UA: NEGATIVE
RBC, UA: NEGATIVE
Specific Gravity, UA: 1.028 (ref 1.005–1.030)
Urobilinogen, Ur: 0.2 mg/dL (ref 0.2–1.0)
pH, UA: 6 (ref 5.0–7.5)

## 2019-10-20 LAB — NICOTINE SCREEN, URINE

## 2019-10-21 LAB — URINE CULTURE, OB REFLEX

## 2019-10-21 LAB — CULTURE, OB URINE

## 2019-10-24 ENCOUNTER — Other Ambulatory Visit: Payer: Self-pay

## 2019-10-24 ENCOUNTER — Ambulatory Visit (INDEPENDENT_AMBULATORY_CARE_PROVIDER_SITE_OTHER): Payer: No Typology Code available for payment source | Admitting: Licensed Clinical Social Worker

## 2019-10-24 DIAGNOSIS — F411 Generalized anxiety disorder: Secondary | ICD-10-CM

## 2019-10-25 NOTE — Progress Notes (Signed)
   Location: Patient: Office Provider: Office     THERAPIST PROGRESS NOTE  Session Time:  10:00 am-10:45 am  Participation Level: Active  Behavioral Response: CasualAlertAnxious  Type of Therapy: Individual Therapy  Treatment Goals addressed: Coping  Interventions: CBT and Solution Focused  Case Summary: Amber Wilson is a 24 y.o. female who presents oriented x5 (person, place, situation, time and object), casually dressed, appropriately groomed, average height, average weight, and cooperative to address anxiety. Patient has minimal history of medical treatment. Patient has a history of mental health treatment including outpatient therapy and medication management. Patient denies suicidal and homicidal ideations. Patient denies psychosis including auditory and visual hallucinations. Patient denies substance abuse. Patient is at low risk for lethality at this time.  Physically: Patient is experiencing an increase in exhaustion. She is tired and still having morning sickness. Patient is sleeping more but her dreams are more vivid.  Spiritually/values: No issues identified.  Relationships: Patient's relationships are going well. 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 Patient feels like the family has too rigid of a schedule.  Emotionally/Mentally/Behavior: Patient's mood is stable. She feels like pregnancy mimics aspects of depression but she is not feeling down. Patient is isolating more but can recognize it is due to being exhausted. Patient feels like she is transitioning from being an young adult to a mother and soon to be wife. She really wants a baby shower but is concerned about being put on bed rest, etc and missing out on the opportunity. Patient also feels a little selfish asking for what she wants with her baby shower, etc. After discussion, patient understood that this is the time to be "selfish" because soon all the celebrations and focus will be on her children.   Patient engaged  in session. Patient responded well to interventions. Patient continues to meet criteria for Generalized Anxiety Disorder. Patient will continue in outpatient therapy due to being the least restrictive service to meet her needs. Patient made moderate progress on her goals.   Suicidal/Homicidal: Negativewithout intent/plan  Therapist Response: Therapist reviewed patient's recent thoughts and behaviors. Therapist utilized CBT to address anxiety. Therapist processed patient's feelings to identify triggers for anxiety. Therapist discussed with patient her pregnancy and being "selfish" about things she wants and transitioning to the role of mother and wife.   Plan: Return again in 3 weeks.  Diagnosis: Axis I: Generalized Anxiety Disorder    Axis II: No diagnosis    I provided 45 minutes of non-face-to-face time during this encounter.  [name], LCSW 10/25/2019

## 2019-11-04 ENCOUNTER — Ambulatory Visit (INDEPENDENT_AMBULATORY_CARE_PROVIDER_SITE_OTHER): Payer: 59 | Admitting: Obstetrics and Gynecology

## 2019-11-04 ENCOUNTER — Other Ambulatory Visit: Payer: Self-pay

## 2019-11-04 ENCOUNTER — Encounter: Payer: Self-pay | Admitting: Obstetrics and Gynecology

## 2019-11-04 ENCOUNTER — Encounter: Payer: Medicaid Other | Admitting: Obstetrics and Gynecology

## 2019-11-04 VITALS — BP 117/70 | HR 78 | Wt 139.9 lb

## 2019-11-04 DIAGNOSIS — Z3A13 13 weeks gestation of pregnancy: Secondary | ICD-10-CM

## 2019-11-04 DIAGNOSIS — O30042 Twin pregnancy, dichorionic/diamniotic, second trimester: Secondary | ICD-10-CM

## 2019-11-04 LAB — POCT URINALYSIS DIPSTICK OB
Bilirubin, UA: NEGATIVE
Blood, UA: NEGATIVE
Glucose, UA: NEGATIVE
Leukocytes, UA: NEGATIVE
Nitrite, UA: NEGATIVE
Spec Grav, UA: 1.015 (ref 1.010–1.025)
Urobilinogen, UA: 0.2 E.U./dL
pH, UA: 6 (ref 5.0–8.0)

## 2019-11-04 NOTE — Progress Notes (Signed)
NOB: No complaints.  Patient doing well on anxiety medication.  Keeping food and liquids down most of the time.  Desires genetic testing today.  Twin pregnancy discussed.  Antenatal testing and possible vaginal versus cesarean delivery discussed.  aFP next visit  Physical examination General NAD, Conversant  HEENT Atraumatic; Op clear with mmm.  Normo-cephalic. Pupils reactive. Anicteric sclerae  Thyroid/Neck Smooth without nodularity or enlargement. Normal ROM.  Neck Supple.  Skin No rashes, lesions or ulceration. Normal palpated skin turgor. No nodularity.  Breasts: No masses or discharge.  Symmetric.  No axillary adenopathy.  Lungs: Clear to auscultation.No rales or wheezes. Normal Respiratory effort, no retractions.  Heart: NSR.  No murmurs or rubs appreciated. No periferal edema  Abdomen: Soft.  Non-tender.  No masses.  No HSM. No hernia  Extremities: Moves all appropriately.  Normal ROM for age. No lymphadenopathy.  Neuro: Oriented to PPT.  Normal mood. Normal affect.     Pelvic:   Vulva: Normal appearance.  No lesions.  Vagina: No lesions or abnormalities noted.  Support: Normal pelvic support.  Urethra No masses tenderness or scarring.  Meatus Normal size without lesions or prolapse.  Cervix: Normal appearance.  No lesions.  Anus: Normal exam.  No lesions.  Perineum: Normal exam.  No lesions.        Bimanual   Adnexae: No masses.  Non-tender to palpation.  Uterus: Enlarged. Pos FHts'  Non-tender.  Mobile.  AV.  Adnexae: No masses.  Non-tender to palpation.  Cul-de-sac: Negative for abnormality.  Adnexae: No masses.  Non-tender to palpation.         Pelvimetry   Diagonal: Reached.  Spines: Average.  Sacrum: Concave.  Pubic Arch: Normal.

## 2019-11-10 LAB — MATERNIT21  PLUS CORE+ESS, BLOOD
11q23 deletion (Jacobsen): NOT DETECTED
15q11 deletion (PW Angelman): NOT DETECTED
1p36 deletion syndrome: NOT DETECTED
22q11 deletion (DiGeorge): NOT DETECTED
4p16 deletion(Wolf-Hirschhorn): NOT DETECTED
5p15 deletion (Cri-du-chat): NOT DETECTED
8q24 deletion (Langer-Giedion): NOT DETECTED
Fetal Fraction: 5
Result (T21): NEGATIVE
Trisomy 13 (Patau syndrome): NEGATIVE
Trisomy 16: NOT DETECTED
Trisomy 18 (Edwards syndrome): NEGATIVE
Trisomy 21 (Down syndrome): NEGATIVE
Trisomy 22: NOT DETECTED

## 2019-11-11 ENCOUNTER — Other Ambulatory Visit: Payer: 59

## 2019-11-14 ENCOUNTER — Ambulatory Visit (INDEPENDENT_AMBULATORY_CARE_PROVIDER_SITE_OTHER): Payer: No Typology Code available for payment source | Admitting: Licensed Clinical Social Worker

## 2019-11-14 ENCOUNTER — Other Ambulatory Visit: Payer: Self-pay

## 2019-11-14 DIAGNOSIS — F411 Generalized anxiety disorder: Secondary | ICD-10-CM | POA: Diagnosis not present

## 2019-11-14 NOTE — Progress Notes (Signed)
   Location: Patient: Office Provider: Office     THERAPIST PROGRESS NOTE  Session Time:  11:00 am-11:45 am  Participation Level: Active  Behavioral Response: CasualAlertAnxious  Type of Therapy: Individual Therapy  Treatment Goals addressed: Coping  Interventions: CBT and Solution Focused  Case Summary: Amber Wilson is a 24 y.o. female who presents oriented x5 (person, place, situation, time and object), casually dressed, appropriately groomed, average height, average weight, and cooperative to address anxiety. Patient has minimal history of medical treatment. Patient has a history of mental health treatment including outpatient therapy and medication management. Patient denies suicidal and homicidal ideations. Patient denies psychosis including auditory and visual hallucinations. Patient denies substance abuse. Patient is at low risk for lethality at this time.  Physically: Patient is exhausted. She had a situation where she almost passed out in the store over the weekend. Patient was able to rest and recover from this. Patient is not enjoying her experience with pregnancy. She is going to have a c-section. Her babies are healthy and have no genetic issues.  Spiritually/values: No issues identified.  Relationships: Patient is getting along well with others. Patient feels like her relatives are trying to give her unsolicited advice about breastfeeding, baby items, baby names, etc. She is annoyed at this but is trying to remember that these are coming from a good place.  Emotionally/Mentally/Behavior: Patient's mood is stable. She has mild anxiety due to her pregnancy, and all the chances her body is going through. She is trying to plan ahead to reduce anxiety. She is planning on leaving her job in December to take care of herself.   Patient engaged in session. Patient responded well to interventions. Patient continues to meet criteria for Generalized Anxiety Disorder. Patient will  continue in outpatient therapy due to being the least restrictive service to meet her needs. Patient made moderate progress on her goals.   Suicidal/Homicidal: Negativewithout intent/plan  Therapist Response: Therapist reviewed patient's recent thoughts and behaviors. Therapist utilized CBT to address anxiety. Therapist processed patient's feelings to identify triggers for anxiety. Therapist discussed with patient her pregnancy and dealing with anxiety.   Plan: Return again in 3 weeks.  Diagnosis: Axis I: Generalized Anxiety Disorder    Axis II: No diagnosis    I provided 45 minutes of non-face-to-face time during this encounter.  [name], LCSW 11/14/2019

## 2019-11-28 ENCOUNTER — Ambulatory Visit (INDEPENDENT_AMBULATORY_CARE_PROVIDER_SITE_OTHER): Payer: No Typology Code available for payment source | Admitting: Licensed Clinical Social Worker

## 2019-11-28 DIAGNOSIS — F411 Generalized anxiety disorder: Secondary | ICD-10-CM | POA: Diagnosis not present

## 2019-11-28 NOTE — Progress Notes (Signed)
Virtual Visit via Video Note  I connected with Amber Wilson on 11/28/19 at 11:00 AM EDT by a video enabled telemedicine application and verified that I am speaking with the correct person using two identifiers.     I discussed the limitations of evaluation and management by telemedicine and the availability of in person appointments. The patient expressed understanding and agreed to proceed.  Location: Patient: Home Provider: Office     THERAPIST PROGRESS NOTE  Session Time:  11:00 am-11:45 am  Participation Level: Active  Behavioral Response: CasualAlertAnxious  Type of Therapy: Individual Therapy  Treatment Goals addressed: Coping  Interventions: CBT and Solution Focused  Case Summary: Amber Wilson is a 24 y.o. female who presents oriented x5 (person, place, situation, time and object), casually dressed, appropriately groomed, average height, average weight, and cooperative to address anxiety. Patient has minimal history of medical treatment. Patient has a history of mental health treatment including outpatient therapy and medication management. Patient denies suicidal and homicidal ideations. Patient denies psychosis including auditory and visual hallucinations. Patient denies substance abuse. Patient is at low risk for lethality at this time.  Physically: Patient is exhausted. She feels like she is getting through pregnancy but not loving it.  Spiritually/values: No issues identified.  Relationships: Patient is feeling alone. Patient feels like all people want to talk to her about is pregnancy. She is tired of talking about it. Patient noted that she got into an argument with her fiance. Patient said that she has several people including her fiance have told her that she comes across as defensive/argumentative. Patient can't get anyone to tell her why. She doesn't recognize that she is being argumentative or defensive. Patient is going to ask if it is her tone or the  content of what she says.   Emotionally/Mentally/Behavior: Patient's mood is down. She can't tell if it is pregnancy or mood. Patient has some anxiety over her fiance meeting her paternal grandparents.    Patient engaged in session. Patient responded well to interventions. Patient continues to meet criteria for Generalized Anxiety Disorder. Patient will continue in outpatient therapy due to being the least restrictive service to meet her needs. Patient made moderate progress on her goals.   Suicidal/Homicidal: Negativewithout intent/plan  Therapist Response: Therapist reviewed patient's recent thoughts and behaviors. Therapist utilized CBT to address anxiety. Therapist processed patient's feelings to identify triggers for anxiety. Therapist discussed with patient her pregnancy and her relationships.   Plan: Return again in 3 weeks.  Diagnosis: Axis I: Generalized Anxiety Disorder    Axis II: No diagnosis I discussed the assessment and treatment plan with the patient. The patient was provided an opportunity to ask questions and all were answered. The patient agreed with the plan and demonstrated an understanding of the instructions.   The patient was advised to call back or seek an in-person evaluation if the symptoms worsen or if the condition fails to improve as anticipated.   I provided 45 minutes of non-face-to-face time during this encounter.  [name], LCSW 11/28/2019

## 2019-11-30 NOTE — Patient Instructions (Signed)
WHAT OB PATIENTS CAN EXPECT   Confirmation of pregnancy and ultrasound ordered if medically indicated-[redacted] weeks gestation  New OB (NOB) intake with nurse and New OB (NOB) labs- [redacted] weeks gestation  New OB (NOB) physical examination with provider- 11/[redacted] weeks gestation  Flu vaccine-[redacted] weeks gestation  Anatomy scan-[redacted] weeks gestation  Glucose tolerance test, blood work to test for anemia, T-dap vaccine-[redacted] weeks gestation  Vaginal swabs/cultures-STD/Group B strep-[redacted] weeks gestation  Appointments every 4 weeks until 28 weeks  Every 2 weeks from 28 weeks until 36 weeks  Weekly visits from 36 weeks until delivery  Second Trimester of Pregnancy  The second trimester is from week 14 through week 27 (month 4 through 6). This is often the time in pregnancy that you feel your best. Often times, morning sickness has lessened or quit. You may have more energy, and you may get hungry more often. Your unborn baby is growing rapidly. At the end of the sixth month, he or she is about 9 inches long and weighs about 1 pounds. You will likely feel the baby move between 18 and 20 weeks of pregnancy. Follow these instructions at home: Medicines  Take over-the-counter and prescription medicines only as told by your doctor. Some medicines are safe and some medicines are not safe during pregnancy.  Take a prenatal vitamin that contains at least 600 micrograms (mcg) of folic acid.  If you have trouble pooping (constipation), take medicine that will make your stool soft (stool softener) if your doctor approves. Eating and drinking   Eat regular, healthy meals.  Avoid raw meat and uncooked cheese.  If you get low calcium from the food you eat, talk to your doctor about taking a daily calcium supplement.  Avoid foods that are high in fat and sugars, such as fried and sweet foods.  If you feel sick to your stomach (nauseous) or throw up (vomit): ? Eat 4 or 5 small meals a day instead of 3 large  meals. ? Try eating a few soda crackers. ? Drink liquids between meals instead of during meals.  To prevent constipation: ? Eat foods that are high in fiber, like fresh fruits and vegetables, whole grains, and beans. ? Drink enough fluids to keep your pee (urine) clear or pale yellow. Activity  Exercise only as told by your doctor. Stop exercising if you start to have cramps.  Do not exercise if it is too hot, too humid, or if you are in a place of great height (high altitude).  Avoid heavy lifting.  Wear low-heeled shoes. Sit and stand up straight.  You can continue to have sex unless your doctor tells you not to. Relieving pain and discomfort  Wear a good support bra if your breasts are tender.  Take warm water baths (sitz baths) to soothe pain or discomfort caused by hemorrhoids. Use hemorrhoid cream if your doctor approves.  Rest with your legs raised if you have leg cramps or low back pain.  If you develop puffy, bulging veins (varicose veins) in your legs: ? Wear support hose or compression stockings as told by your doctor. ? Raise (elevate) your feet for 15 minutes, 3-4 times a day. ? Limit salt in your food. Prenatal care  Write down your questions. Take them to your prenatal visits.  Keep all your prenatal visits as told by your doctor. This is important. Safety  Wear your seat belt when driving.  Make a list of emergency phone numbers, including numbers for family, friends, the   hospital, and police and fire departments. General instructions  Ask your doctor about the right foods to eat or for help finding a counselor, if you need these services.  Ask your doctor about local prenatal classes. Begin classes before month 6 of your pregnancy.  Do not use hot tubs, steam rooms, or saunas.  Do not douche or use tampons or scented sanitary pads.  Do not cross your legs for long periods of time.  Visit your dentist if you have not done so. Use a soft toothbrush  to brush your teeth. Floss gently.  Avoid all smoking, herbs, and alcohol. Avoid drugs that are not approved by your doctor.  Do not use any products that contain nicotine or tobacco, such as cigarettes and e-cigarettes. If you need help quitting, ask your doctor.  Avoid cat litter boxes and soil used by cats. These carry germs that can cause birth defects in the baby and can cause a loss of your baby (miscarriage) or stillbirth. Contact a doctor if:  You have mild cramps or pressure in your lower belly.  You have pain when you pee (urinate).  You have bad smelling fluid coming from your vagina.  You continue to feel sick to your stomach (nauseous), throw up (vomit), or have watery poop (diarrhea).  You have a nagging pain in your belly area.  You feel dizzy. Get help right away if:  You have a fever.  You are leaking fluid from your vagina.  You have spotting or bleeding from your vagina.  You have severe belly cramping or pain.  You lose or gain weight rapidly.  You have trouble catching your breath and have chest pain.  You notice sudden or extreme puffiness (swelling) of your face, hands, ankles, feet, or legs.  You have not felt the baby move in over an hour.  You have severe headaches that do not go away when you take medicine.  You have trouble seeing. Summary  The second trimester is from week 14 through week 27 (months 4 through 6). This is often the time in pregnancy that you feel your best.  To take care of yourself and your unborn baby, you will need to eat healthy meals, take medicines only if your doctor tells you to do so, and do activities that are safe for you and your baby.  Call your doctor if you get sick or if you notice anything unusual about your pregnancy. Also, call your doctor if you need help with the right food to eat, or if you want to know what activities are safe for you. This information is not intended to replace advice given to you by  your health care provider. Make sure you discuss any questions you have with your health care provider. Document Revised: 06/11/2018 Document Reviewed: 03/25/2016 Elsevier Patient Education  2020 [employer] Common Medications Safe in Pregnancy  Acne:      Constipation:  Benzoyl Peroxide     Colace  Clindamycin      Dulcolax Suppository  Topica Erythromycin     Fibercon  Salicylic Acid      Metamucil         Miralax AVOID:        Senakot   Accutane    Cough:  Retin-A       Cough Drops  Tetracycline      Phenergan w/ Codeine if Rx  Minocycline      Robitussin (Plain & DM)  Antibiotics:     Crabs/Lice:    Ceclor       RID  Cephalosporins    AVOID:  E-Mycins      Kwell  Keflex  Macrobid/Macrodantin   Diarrhea:  Penicillin      Kao-Pectate  Zithromax      Imodium AD         PUSH FLUIDS AVOID:       Cipro     Fever:  Tetracycline      Tylenol (Regular or Extra  Minocycline       Strength)  Levaquin      Extra Strength-Do not          Exceed 8 tabs/24 hrs Caffeine:        <200mg/day (equiv. To 1 cup of coffee or  approx. 3 12 oz sodas)         Gas: Cold/Hayfever:       Gas-X  Benadryl      Mylicon  Claritin       Phazyme  **Claritin-D        Chlor-Trimeton    Headaches:  Dimetapp      ASA-Free Excedrin  Drixoral-Non-Drowsy     Cold Compress  Mucinex (Guaifenasin)     Tylenol (Regular or Extra  Sudafed/Sudafed-12 Hour     Strength)  **Sudafed PE Pseudoephedrine   Tylenol Cold & Sinus     Vicks Vapor Rub  Zyrtec  **AVOID if Problems With Blood Pressure         Heartburn: Avoid lying down for at least 1 hour after meals  Aciphex      Maalox     Rash:  Milk of Magnesia     Benadryl    Mylanta       1% Hydrocortisone Cream  Pepcid  Pepcid Complete   Sleep Aids:  Prevacid      Ambien   Prilosec       Benadryl  Rolaids       Chamomile Tea  Tums (Limit 4/day)     Unisom         Tylenol PM         Warm milk-add vanilla or  Hemorrhoids:       Sugar for  taste  Anusol/Anusol H.C.  (RX: Analapram 2.5%)  Sugar Substitutes:  Hydrocortisone OTC     Ok in moderation  Preparation H      Tucks        Vaseline lotion applied to tissue with wiping    Herpes:     Throat:  Acyclovir      Oragel  Famvir  Valtrex     Vaccines:         Flu Shot Leg Cramps:       *Gardasil  Benadryl      Hepatitis A         Hepatitis B Nasal Spray:       Pneumovax  Saline Nasal Spray     Polio Booster         Tetanus Nausea:       Tuberculosis test or PPD  Vitamin B6 25 mg TID   AVOID:    Dramamine      *Gardasil  Emetrol       Live Poliovirus  Ginger Root 250 mg QID    MMR (measles, mumps &  High Complex Carbs @ Bedtime    rebella)  Sea Bands-Accupressure    Varicella (Chickenpox)  Unisom 1/2 tab TID     *No known complications             If received before Pain:         Known pregnancy;   Darvocet       Resume series after  Lortab        Delivery  Percocet    Yeast:   Tramadol      Femstat  Tylenol 3      Gyne-lotrimin  Ultram       Monistat  Vicodin           MISC:         All Sunscreens           Hair Coloring/highlights          Insect Repellant's          (Including DEET)         Mystic Tans

## 2019-11-30 NOTE — Progress Notes (Signed)
ROB-Pt present for routine prenatal care. Pt stated that she noticed lower abd pain that was severe for about 30 seconds then stopped. Pt stated afterwards she is having right side abd tenderness and pain. Pt stated that she would like to wait to take her flu vaccine. GAD-7=12

## 2019-12-02 ENCOUNTER — Ambulatory Visit (INDEPENDENT_AMBULATORY_CARE_PROVIDER_SITE_OTHER): Payer: Medicaid Other | Admitting: Obstetrics and Gynecology

## 2019-12-02 ENCOUNTER — Other Ambulatory Visit: Payer: Self-pay

## 2019-12-02 ENCOUNTER — Encounter: Payer: Self-pay | Admitting: Obstetrics and Gynecology

## 2019-12-02 VITALS — BP 97/61 | HR 81 | Wt 142.3 lb

## 2019-12-02 DIAGNOSIS — Z3A17 17 weeks gestation of pregnancy: Secondary | ICD-10-CM

## 2019-12-02 DIAGNOSIS — O30042 Twin pregnancy, dichorionic/diamniotic, second trimester: Secondary | ICD-10-CM

## 2019-12-02 DIAGNOSIS — Z8659 Personal history of other mental and behavioral disorders: Secondary | ICD-10-CM

## 2019-12-02 DIAGNOSIS — Z1379 Encounter for other screening for genetic and chromosomal anomalies: Secondary | ICD-10-CM

## 2019-12-02 DIAGNOSIS — Z3402 Encounter for supervision of normal first pregnancy, second trimester: Secondary | ICD-10-CM

## 2019-12-02 LAB — POCT URINALYSIS DIPSTICK OB
Bilirubin, UA: NEGATIVE
Blood, UA: NEGATIVE
Glucose, UA: NEGATIVE
Ketones, UA: NEGATIVE
Leukocytes, UA: NEGATIVE
Nitrite, UA: NEGATIVE
POC,PROTEIN,UA: NEGATIVE
Spec Grav, UA: <=1.005 — AB (ref 1.010–1.025)
Urobilinogen, UA: 0.2 E.U./dL
pH, UA: 8.5 — AB (ref 5.0–8.0)

## 2019-12-02 NOTE — Progress Notes (Signed)
ROB: Patient noted sharp pain 2 days ago that lasted for 30 seconds then mostly resolved. Still having some soreness on right side. Will hold off on flu vaccine for now but will likely get at outside clinic with other family members. Further discussed plans for mode of delivery with twins (vaginal vs C-section), dependent on fetal presentation. H/o anxiety, currently on meds (lexapro and buspar), managed by Dr. Margretta Sidle [facility]), and has a Social worker in [place] for therapy. Will monitor growth in pregnancy while on meds.  GAD-7 score = 12. Normal MaterniT21, for AFP today. RTC in 3 weeks for anatomy scan and visit.

## 2019-12-06 LAB — AFP, SERUM, OPEN SPINA BIFIDA
AFP MoM: 2.18
AFP Value: 87 ng/mL
Gest. Age on Collection Date: 17 weeks
Maternal Age At EDD: 26 yr
OSBR Risk 1 IN: 1200
Test Results:: NEGATIVE
Weight: 142 [lb_av]

## 2019-12-08 ENCOUNTER — Other Ambulatory Visit: Payer: Self-pay | Admitting: Physician Assistant

## 2019-12-08 DIAGNOSIS — F419 Anxiety disorder, unspecified: Secondary | ICD-10-CM

## 2019-12-19 ENCOUNTER — Ambulatory Visit (INDEPENDENT_AMBULATORY_CARE_PROVIDER_SITE_OTHER): Payer: No Typology Code available for payment source | Admitting: Licensed Clinical Social Worker

## 2019-12-19 DIAGNOSIS — F411 Generalized anxiety disorder: Secondary | ICD-10-CM | POA: Diagnosis not present

## 2019-12-19 NOTE — Progress Notes (Signed)
Virtual Visit via Video Note  I connected with Amber Wilson on 12/19/19 at 10:00 AM EDT by a video enabled telemedicine application and verified that I am speaking with the correct person using two identifiers.     I discussed the limitations of evaluation and management by telemedicine and the availability of in person appointments. The patient expressed understanding and agreed to proceed.  Location: Patient: Home Provider: Office   THERAPIST PROGRESS NOTE  Session Time:  10:00 am-10:45 am  Participation Level: Active  Behavioral Response: CasualAlertAnxious  Type of Therapy: Individual Therapy  Treatment Goals addressed: Coping  Interventions: CBT and Solution Focused  Case Summary: Amber Wilson is a 24 y.o. female who presents oriented x5 (person, place, situation, time and object), casually dressed, appropriately groomed, average height, average weight, and cooperative to address anxiety. Patient has minimal history of medical treatment. Patient has a history of mental health treatment including outpatient therapy and medication management. Patient denies suicidal and homicidal ideations. Patient denies psychosis including auditory and visual hallucinations. Patient denies substance abuse. Patient is at low risk for lethality at this time.  Physically: Patient has been sore and tired. She is stopping work in early Nov. Patient is planning on relaxing and taking care of herself.  Spiritually/values: No issues identified.  Relationships: Patient had a disagreement with her grandfather. She feels like he overstepped his bounds on something with housing. She knows it will calm down. Patient has been getting along with others. She feels like no one has mentioned her anger/aggression. Patient recognized that she has a tone at times and usually when she is trying to hold herself back from saying something negative.  Emotionally/Mentally/Behavior: Patient's mood is stable.  Patient is managing her anxiety. Patient feels like she is over being pregnant.     Patient engaged in session. Patient responded well to interventions. Patient continues to meet criteria for Generalized Anxiety Disorder. Patient will continue in outpatient therapy due to being the least restrictive service to meet her needs. Patient made moderate progress on her goals.   Suicidal/Homicidal: Negativewithout intent/plan  Therapist Response: Therapist reviewed patient's recent thoughts and behaviors. Therapist utilized CBT to address anxiety. Therapist processed patient's feelings to identify triggers for anxiety. Therapist discussed with patient her relationships.   Plan: Return again in 3 weeks.  Diagnosis: Axis I: Generalized Anxiety Disorder    Axis II: No diagnosis   I discussed the assessment and treatment plan with the patient. The patient was provided an opportunity to ask questions and all were answered. The patient agreed with the plan and demonstrated an understanding of the instructions.   The patient was advised to call back or seek an in-person evaluation if the symptoms worsen or if the condition fails to improve as anticipated.   I provided 45 minutes of non-face-to-face time during this encounter.  [name], LCSW 12/19/2019

## 2019-12-20 ENCOUNTER — Ambulatory Visit (INDEPENDENT_AMBULATORY_CARE_PROVIDER_SITE_OTHER): Payer: Medicaid Other | Admitting: Obstetrics and Gynecology

## 2019-12-20 ENCOUNTER — Ambulatory Visit (INDEPENDENT_AMBULATORY_CARE_PROVIDER_SITE_OTHER): Payer: Medicaid Other

## 2019-12-20 ENCOUNTER — Other Ambulatory Visit: Payer: Self-pay

## 2019-12-20 ENCOUNTER — Encounter: Payer: Self-pay | Admitting: Obstetrics and Gynecology

## 2019-12-20 VITALS — BP 105/67 | HR 64 | Wt 144.7 lb

## 2019-12-20 DIAGNOSIS — Z3402 Encounter for supervision of normal first pregnancy, second trimester: Secondary | ICD-10-CM | POA: Diagnosis not present

## 2019-12-20 DIAGNOSIS — Z3A19 19 weeks gestation of pregnancy: Secondary | ICD-10-CM | POA: Diagnosis not present

## 2019-12-20 LAB — POCT URINALYSIS DIPSTICK OB
Bilirubin, UA: NEGATIVE
Blood, UA: NEGATIVE
Glucose, UA: NEGATIVE
Ketones, UA: NEGATIVE
Leukocytes, UA: NEGATIVE
Nitrite, UA: NEGATIVE
Spec Grav, UA: 1.01 (ref 1.010–1.025)
Urobilinogen, UA: 0.2 E.U./dL
pH, UA: 8 (ref 5.0–8.0)

## 2019-12-20 NOTE — Progress Notes (Signed)
ROB-Pt present for routine prenatal care

## 2019-12-20 NOTE — Progress Notes (Signed)
ROB: FAS today-incomplete will reschedule ultrasound.  Patient says she is doing well.  Anxiety level "about the same".  Numerous life changes including moving, has been changing job, patient leaving her current job.  With further discussion she seems to be handling all this well.  Discussed future visits ultrasounds NSTs NICU etc.

## 2020-01-02 ENCOUNTER — Ambulatory Visit (INDEPENDENT_AMBULATORY_CARE_PROVIDER_SITE_OTHER): Payer: No Typology Code available for payment source | Admitting: Licensed Clinical Social Worker

## 2020-01-02 ENCOUNTER — Other Ambulatory Visit: Payer: Self-pay

## 2020-01-02 DIAGNOSIS — F411 Generalized anxiety disorder: Secondary | ICD-10-CM | POA: Diagnosis not present

## 2020-01-02 NOTE — Progress Notes (Signed)
Virtual Visit via Video Note  I connected with Amber Wilson on 01/02/20 at 10:00 AM EDT by a video enabled telemedicine application and verified that I am speaking with the correct person using two identifiers.     I discussed the limitations of evaluation and management by telemedicine and the availability of in person appointments. The patient expressed understanding and agreed to proceed.  Location: Patient: Home Provider: Office   THERAPIST PROGRESS NOTE  Session Time:  10:00 am-10:57 am  Participation Level: Active  Behavioral Response: CasualAlertAnxious  Type of Therapy: Individual Therapy  Treatment Goals addressed: Coping  Interventions: CBT and Solution Focused  Case Summary: Amber Wilson is a 24 y.o. female who presents oriented x5 (person, place, situation, time and object), casually dressed, appropriately groomed, average height, average weight, and cooperative to address anxiety. Patient has minimal history of medical treatment. Patient has a history of mental health treatment including outpatient therapy and medication management. Patient denies suicidal and homicidal ideations. Patient denies psychosis including auditory and visual hallucinations. Patient denies substance abuse. Patient is at low risk for lethality at this time.  Physically: Patient his tired. Her pregnancy is going well.   Spiritually/values: No issues identified.  Relationships: Patient's relationships are going well.  Emotionally/Mentally/Behavior: Patient's mood is stable. She has some anxiety about stopping her job. While she is ready to stop working, she doesn't want to be without money. Patient admitted that she has a spending problem. Patient has not been without a job or money for several years so it feels odd to her. Patient acknowledged that it is a control and pride issue because she likes to be able to spend money when she wants as well as doesn't like to depend on people for  things like baby clothes, furniture, etc.   Patient engaged in session. Patient responded well to interventions. Patient continues to meet criteria for Generalized Anxiety Disorder. Patient will continue in outpatient therapy due to being the least restrictive service to meet her needs. Patient made moderate progress on her goals.   Suicidal/Homicidal: Negativewithout intent/plan  Therapist Response: Therapist reviewed patient's recent thoughts and behaviors. Therapist utilized CBT to address anxiety. Therapist processed patient's feelings to identify triggers for anxiety. Therapist discussed with patient her triggers for anxiety related to stopping working.   Plan: Return again in 3 weeks.  Diagnosis: Axis I: Generalized Anxiety Disorder    Axis II: No diagnosis   I discussed the assessment and treatment plan with the patient. The patient was provided an opportunity to ask questions and all were answered. The patient agreed with the plan and demonstrated an understanding of the instructions.   The patient was advised to call back or seek an in-person evaluation if the symptoms worsen or if the condition fails to improve as anticipated.   I provided 57 minutes of non-face-to-face time during this encounter.  [name], LCSW 01/02/2020

## 2020-01-03 ENCOUNTER — Other Ambulatory Visit: Payer: Self-pay | Admitting: Obstetrics and Gynecology

## 2020-01-03 ENCOUNTER — Other Ambulatory Visit: Payer: Self-pay | Admitting: Physician Assistant

## 2020-01-03 ENCOUNTER — Ambulatory Visit (INDEPENDENT_AMBULATORY_CARE_PROVIDER_SITE_OTHER): Payer: Medicaid Other

## 2020-01-03 DIAGNOSIS — O30009 Twin pregnancy, unspecified number of placenta and unspecified number of amniotic sacs, unspecified trimester: Secondary | ICD-10-CM

## 2020-01-03 DIAGNOSIS — Z3A21 21 weeks gestation of pregnancy: Secondary | ICD-10-CM | POA: Diagnosis not present

## 2020-01-03 DIAGNOSIS — O30002 Twin pregnancy, unspecified number of placenta and unspecified number of amniotic sacs, second trimester: Secondary | ICD-10-CM

## 2020-01-03 DIAGNOSIS — Z09 Encounter for follow-up examination after completed treatment for conditions other than malignant neoplasm: Secondary | ICD-10-CM

## 2020-01-03 DIAGNOSIS — Z3689 Encounter for other specified antenatal screening: Secondary | ICD-10-CM

## 2020-01-03 DIAGNOSIS — F419 Anxiety disorder, unspecified: Secondary | ICD-10-CM

## 2020-01-03 NOTE — Telephone Encounter (Signed)
Requested medication (s) are due for refill today: no  Requested medication (s) are on the active medication list: yes  Last refill:  12/08/2019  Future visit scheduled:no  Notes to clinic: overdue for 6 month follow up   Requested Prescriptions  Pending Prescriptions Disp Refills   escitalopram (LEXAPRO) 10 MG tablet [Pharmacy Med Name: ESCITALOPRAM 10 MG TABLET] 30 tablet 5    Sig: TAKE 1 TABLET BY MOUTH EVERY DAY      Psychiatry:  Antidepressants - SSRI Failed - 01/03/2020  1:18 AM      Failed - Valid encounter within last 6 months    Recent Outpatient Visits           6 months ago Erroneous encounter - disregard   [facility] [place], [place]   8 months ago [place], [place], [place]   10 months ago [place], [place]   10 months ago [place], [place]   1 year ago [place], [place], [place]-C                busPIRone (BUSPAR) 15 MG tablet [Pharmacy Med Name: BUSPIRONE HCL 15 MG TABLET] 60 tablet 0    Sig: TAKE 1 TABLET (15 MG TOTAL) BY MOUTH 2 (TWO) TIMES DAILY.      Psychiatry: Anxiolytics/Hypnotics - Non-controlled Failed - 01/03/2020  1:18 AM      Failed - Valid encounter within last 6 months    Recent Outpatient Visits           6 months ago Erroneous encounter - disregard   [place], [place], [place]   8 months ago [place], [place], [place]   10 months ago [place], [place], [place]   10 months ago [place], [place]ee [place], [place]   1 year ago [place], [place], [place]

## 2020-01-05 NOTE — Telephone Encounter (Signed)
L.O.V. was 04/22/2019 and no upcoming appointment.

## 2020-01-11 NOTE — Telephone Encounter (Signed)
Pt called in to follow up on refill for medications. Pt says that she is completely out of   escitalopram (LEXAPRO) 10 MG tablet   Pt would like to know if provider could send in to pharmacy.  Please advise.

## 2020-01-16 ENCOUNTER — Ambulatory Visit (INDEPENDENT_AMBULATORY_CARE_PROVIDER_SITE_OTHER): Payer: No Typology Code available for payment source | Admitting: Licensed Clinical Social Worker

## 2020-01-16 DIAGNOSIS — F411 Generalized anxiety disorder: Secondary | ICD-10-CM | POA: Diagnosis not present

## 2020-01-16 NOTE — Progress Notes (Signed)
ROB-Pt present for routine prenatal care. Pt c/o of abd pressure no pain. Pt will get flu vaccine at 28 weeks.

## 2020-01-16 NOTE — Patient Instructions (Addendum)
WHAT OB PATIENTS CAN EXPECT   Confirmation of pregnancy and ultrasound ordered if medically indicated-[redacted] weeks gestation  New OB (NOB) intake with nurse and New OB (NOB) labs- [redacted] weeks gestation  New OB (NOB) physical examination with provider- 11/[redacted] weeks gestation  Flu vaccine-[redacted] weeks gestation  Anatomy scan-[redacted] weeks gestation  Glucose tolerance test, blood work to test for anemia, T-dap vaccine-[redacted] weeks gestation  Vaginal swabs/cultures-STD/Group B strep-[redacted] weeks gestation  Appointments every 4 weeks until 28 weeks  Every 2 weeks from 28 weeks until 36 weeks  Weekly visits from 36 weeks until delivery  Third Trimester of Pregnancy  The third trimester is from week 28 through week 40 (months 7 through 9). This trimester is when your unborn baby (fetus) is growing very fast. At the end of the ninth month, the unborn baby is about 20 inches in length. It weighs about 6-10 pounds. Follow these instructions at home: Medicines  Take over-the-counter and prescription medicines only as told by your doctor. Some medicines are safe and some medicines are not safe during pregnancy.  Take a prenatal vitamin that contains at least 600 micrograms (mcg) of folic acid.  If you have trouble pooping (constipation), take medicine that will make your stool soft (stool softener) if your doctor approves. Eating and drinking   Eat regular, healthy meals.  Avoid raw meat and uncooked cheese.  If you get low calcium from the food you eat, talk to your doctor about taking a daily calcium supplement.  Eat four or five small meals rather than three large meals a day.  Avoid foods that are high in fat and sugars, such as fried and sweet foods.  To prevent constipation: ? Eat foods that are high in fiber, like fresh fruits and vegetables, whole grains, and beans. ? Drink enough fluids to keep your pee (urine) clear or pale yellow. Activity  Exercise only as told by your doctor. Stop  exercising if you start to have cramps.  Avoid heavy lifting, wear low heels, and sit up straight.  Do not exercise if it is too hot, too humid, or if you are in a place of great height (high altitude).  You may continue to have sex unless your doctor tells you not to. Relieving pain and discomfort  Wear a good support bra if your breasts are tender.  Take frequent breaks and rest with your legs raised if you have leg cramps or low back pain.  Take warm water baths (sitz baths) to soothe pain or discomfort caused by hemorrhoids. Use hemorrhoid cream if your doctor approves.  If you develop puffy, bulging veins (varicose veins) in your legs: ? Wear support hose or compression stockings as told by your doctor. ? Raise (elevate) your feet for 15 minutes, 3-4 times a day. ? Limit salt in your food. Safety  Wear your seat belt when driving.  Make a list of emergency phone numbers, including numbers for family, friends, the hospital, and police and fire departments. Preparing for your baby's arrival To prepare for the arrival of your baby:  Take prenatal classes.  Practice driving to the hospital.  Visit the hospital and tour the maternity area.  Talk to your work about taking leave once the baby comes.  Pack your hospital bag.  Prepare the baby's room.  Go to your doctor visits.  Buy a rear-facing car seat. Learn how to install it in your car. General instructions  Do not use hot tubs, steam rooms, or saunas.  Do not use any products that contain nicotine or tobacco, such as cigarettes and e-cigarettes. If you need help quitting, ask your doctor.  Do not drink alcohol.  Do not douche or use tampons or scented sanitary pads.  Do not cross your legs for long periods of time.  Do not travel for long distances unless you must. Only do so if your doctor says it is okay.  Visit your dentist if you have not gone during your pregnancy. Use a soft toothbrush to brush your  teeth. Be gentle when you floss.  Avoid cat litter boxes and soil used by cats. These carry germs that can cause birth defects in the baby and can cause a loss of your baby (miscarriage) or stillbirth.  Keep all your prenatal visits as told by your doctor. This is important. Contact a doctor if:  You are not sure if you are in labor or if your water has broken.  You are dizzy.  You have mild cramps or pressure in your lower belly.  You have a nagging pain in your belly area.  You continue to feel sick to your stomach, you throw up, or you have watery poop.  You have bad smelling fluid coming from your vagina.  You have pain when you pee. Get help right away if:  You have a fever.  You are leaking fluid from your vagina.  You are spotting or bleeding from your vagina.  You have severe belly cramps or pain.  You lose or gain weight quickly.  You have trouble catching your breath and have chest pain.  You notice sudden or extreme puffiness (swelling) of your face, hands, ankles, feet, or legs.  You have not felt the baby move in over an hour.  You have severe headaches that do not go away with medicine.  You have trouble seeing.  You are leaking, or you are having a gush of fluid, from your vagina before you are 37 weeks.  You have regular belly spasms (contractions) before you are 37 weeks. Summary  The third trimester is from week 28 through week 40 (months 7 through 9). This time is when your unborn baby is growing very fast.  Follow your doctor's advice about medicine, food, and activity.  Get ready for the arrival of your baby by taking prenatal classes, getting all the baby items ready, preparing the baby's room, and visiting your doctor to be checked.  Get help right away if you are bleeding from your vagina, or you have chest pain and trouble catching your breath, or if you have not felt your baby move in over an hour. This information is not intended to  replace advice given to you by your health care provider. Make sure you discuss any questions you have with your health care provider. Document Revised: 06/10/2018 Document Reviewed: 03/25/2016 Elsevier Patient Education  2020 [employer].   Breastfeeding  Choosing to breastfeed is one of the best decisions you can make for yourself and your baby. A change in hormones during pregnancy causes your breasts to make breast milk in your milk-producing glands. Hormones prevent breast milk from being released before your baby is born. They also prompt milk flow after birth. Once breastfeeding has begun, thoughts of your baby, as well as his or her sucking or crying, can stimulate the release of milk from your milk-producing glands. Benefits of breastfeeding Research shows that breastfeeding offers many health benefits for infants and mothers. It also offers a cost-free  and convenient way to feed your baby. For your baby  Your first milk (colostrum) helps your baby's digestive system to function better.  Special cells in your milk (antibodies) help your baby to fight off infections.  Breastfed babies are less likely to develop asthma, allergies, obesity, or type 2 diabetes. They are also at lower risk for sudden infant death syndrome (SIDS).  Nutrients in breast milk are better able to meet your baby's needs compared to infant formula.  Breast milk improves your baby's brain development. For you  Breastfeeding helps to create a very special bond between you and your baby.  Breastfeeding is convenient. Breast milk costs nothing and is always available at the correct temperature.  Breastfeeding helps to burn calories. It helps you to lose the weight that you gained during pregnancy.  Breastfeeding makes your uterus return faster to its size before pregnancy. It also slows bleeding (lochia) after you give birth.  Breastfeeding helps to lower your risk of developing type 2 diabetes,  osteoporosis, rheumatoid arthritis, cardiovascular disease, and breast, ovarian, uterine, and endometrial cancer later in life. Breastfeeding basics Starting breastfeeding  Find a comfortable place to sit or lie down, with your neck and back well-supported.  Place a pillow or a rolled-up blanket under your baby to bring him or her to the level of your breast (if you are seated). Nursing pillows are specially designed to help support your arms and your baby while you breastfeed.  Make sure that your baby's tummy (abdomen) is facing your abdomen.  Gently massage your breast. With your fingertips, massage from the outer edges of your breast inward toward the nipple. This encourages milk flow. If your milk flows slowly, you may need to continue this action during the feeding.  Support your breast with 4 fingers underneath and your thumb above your nipple (make the letter "C" with your hand). Make sure your fingers are well away from your nipple and your baby's mouth.  Stroke your baby's lips gently with your finger or nipple.  When your baby's mouth is open wide enough, quickly bring your baby to your breast, placing your entire nipple and as much of the areola as possible into your baby's mouth. The areola is the colored area around your nipple. ? More areola should be visible above your baby's upper lip than below the lower lip. ? Your baby's lips should be opened and extended outward (flanged) to ensure an adequate, comfortable latch. ? Your baby's tongue should be between his or her lower gum and your breast.  Make sure that your baby's mouth is correctly positioned around your nipple (latched). Your baby's lips should create a seal on your breast and be turned out (everted).  It is common for your baby to suck about 2-3 minutes in order to start the flow of breast milk. Latching Teaching your baby how to latch onto your breast properly is very important. An improper latch can cause nipple  pain, decreased milk supply, and poor weight gain in your baby. Also, if your baby is not latched onto your nipple properly, he or she may swallow some air during feeding. This can make your baby fussy. Burping your baby when you switch breasts during the feeding can help to get rid of the air. However, teaching your baby to latch on properly is still the best way to prevent fussiness from swallowing air while breastfeeding. Signs that your baby has successfully latched onto your nipple  Silent tugging or silent sucking, without  causing you pain. Infant's lips should be extended outward (flanged).  Swallowing heard between every 3-4 sucks once your milk has started to flow (after your let-down milk reflex occurs).  Muscle movement above and in front of his or her ears while sucking. Signs that your baby has not successfully latched onto your nipple  Sucking sounds or smacking sounds from your baby while breastfeeding.  Nipple pain. If you think your baby has not latched on correctly, slip your finger into the corner of your baby's mouth to break the suction and place it between your baby's gums. Attempt to start breastfeeding again. Signs of successful breastfeeding Signs from your baby  Your baby will gradually decrease the number of sucks or will completely stop sucking.  Your baby will fall asleep.  Your baby's body will relax.  Your baby will retain a small amount of milk in his or her mouth.  Your baby will let go of your breast by himself or herself. Signs from you  Breasts that have increased in firmness, weight, and size 1-3 hours after feeding.  Breasts that are softer immediately after breastfeeding.  Increased milk volume, as well as a change in milk consistency and color by the fifth day of breastfeeding.  Nipples that are not sore, cracked, or bleeding. Signs that your baby is getting enough milk  Wetting at least 1-2 diapers during the first 24 hours after  birth.  Wetting at least 5-6 diapers every 24 hours for the first week after birth. The urine should be clear or pale yellow by the age of 5 days.  Wetting 6-8 diapers every 24 hours as your baby continues to grow and develop.  At least 3 stools in a 24-hour period by the age of 5 days. The stool should be soft and yellow.  At least 3 stools in a 24-hour period by the age of 7 days. The stool should be seedy and yellow.  No loss of weight greater than 10% of birth weight during the first 3 days of life.  Average weight gain of 4-7 oz (113-198 g) per week after the age of 4 days.  Consistent daily weight gain by the age of 5 days, without weight loss after the age of 2 weeks. After a feeding, your baby may spit up a small amount of milk. This is normal. Breastfeeding frequency and duration Frequent feeding will help you make more milk and can prevent sore nipples and extremely full breasts (breast engorgement). Breastfeed when you feel the need to reduce the fullness of your breasts or when your baby shows signs of hunger. This is called "breastfeeding on demand." Signs that your baby is hungry include:  Increased alertness, activity, or restlessness.  Movement of the head from side to side.  Opening of the mouth when the corner of the mouth or cheek is stroked (rooting).  Increased sucking sounds, smacking lips, cooing, sighing, or squeaking.  Hand-to-mouth movements and sucking on fingers or hands.  Fussing or crying. Avoid introducing a pacifier to your baby in the first 4-6 weeks after your baby is born. After this time, you may choose to use a pacifier. Research has shown that pacifier use during the first year of a baby's life decreases the risk of sudden infant death syndrome (SIDS). Allow your baby to feed on each breast as long as he or she wants. When your baby unlatches or falls asleep while feeding from the first breast, offer the second breast. Because newborns are often  sleepy in the first few weeks of life, you may need to awaken your baby to get him or her to feed. Breastfeeding times will vary from baby to baby. However, the following rules can serve as a guide to help you make sure that your baby is properly fed:  Newborns (babies 25 weeks of age or younger) may breastfeed every 1-3 hours.  Newborns should not go without breastfeeding for longer than 3 hours during the day or 5 hours during the night.  You should breastfeed your baby a minimum of 8 times in a 24-hour period. Breast milk pumping     Pumping and storing breast milk allows you to make sure that your baby is exclusively fed your breast milk, even at times when you are unable to breastfeed. This is especially important if you go back to work while you are still breastfeeding, or if you are not able to be present during feedings. Your lactation consultant can help you find a method of pumping that works best for you and give you guidelines about how long it is safe to store breast milk. Caring for your breasts while you breastfeed Nipples can become dry, cracked, and sore while breastfeeding. The following recommendations can help keep your breasts moisturized and healthy:  Avoid using soap on your nipples.  Wear a supportive bra designed especially for nursing. Avoid wearing underwire-style bras or extremely tight bras (sports bras).  Air-dry your nipples for 3-4 minutes after each feeding.  Use only cotton bra pads to absorb leaked breast milk. Leaking of breast milk between feedings is normal.  Use lanolin on your nipples after breastfeeding. Lanolin helps to maintain your skin's normal moisture barrier. Pure lanolin is not harmful (not toxic) to your baby. You may also hand express a few drops of breast milk and gently massage that milk into your nipples and allow the milk to air-dry. In the first few weeks after giving birth, some women experience breast engorgement. Engorgement can make  your breasts feel heavy, warm, and tender to the touch. Engorgement peaks within 3-5 days after you give birth. The following recommendations can help to ease engorgement:  Completely empty your breasts while breastfeeding or pumping. You may want to start by applying warm, moist heat (in the shower or with warm, water-soaked hand towels) just before feeding or pumping. This increases circulation and helps the milk flow. If your baby does not completely empty your breasts while breastfeeding, pump any extra milk after he or she is finished.  Apply ice packs to your breasts immediately after breastfeeding or pumping, unless this is too uncomfortable for you. To do this: ? Put ice in a plastic bag. ? Place a towel between your skin and the bag. ? Leave the ice on for 20 minutes, 2-3 times a day.  Make sure that your baby is latched on and positioned properly while breastfeeding. If engorgement persists after 48 hours of following these recommendations, contact your health care provider or a Science writer. Overall health care recommendations while breastfeeding  Eat 3 healthy meals and 3 snacks every day. Well-nourished mothers who are breastfeeding need an additional 450-500 calories a day. You can meet this requirement by increasing the amount of a balanced diet that you eat.  Drink enough water to keep your urine pale yellow or clear.  Rest often, relax, and continue to take your prenatal vitamins to prevent fatigue, stress, and low vitamin and mineral levels in your body (nutrient deficiencies).  Do not  use any products that contain nicotine or tobacco, such as cigarettes and e-cigarettes. Your baby may be harmed by chemicals from cigarettes that pass into breast milk and exposure to secondhand smoke. If you need help quitting, ask your health care provider.  Avoid alcohol.  Do not use illegal drugs or marijuana.  Talk with your health care provider before taking any medicines. These  include over-the-counter and prescription medicines as well as vitamins and herbal supplements. Some medicines that may be harmful to your baby can pass through breast milk.  It is possible to become pregnant while breastfeeding. If birth control is desired, ask your health care provider about options that will be safe while breastfeeding your baby. Where to find more information: [employer]: www.llli.org Contact a health care provider if:  You feel like you want to stop breastfeeding or have become frustrated with breastfeeding.  Your nipples are cracked or bleeding.  Your breasts are red, tender, or warm.  You have: ? Painful breasts or nipples. ? A swollen area on either breast. ? A fever or chills. ? Nausea or vomiting. ? Drainage other than breast milk from your nipples.  Your breasts do not become full before feedings by the fifth day after you give birth.  You feel sad and depressed.  Your baby is: ? Too sleepy to eat well. ? Having trouble sleeping. ? More than 68 week old and wetting fewer than 6 diapers in a 24-hour period. ? Not gaining weight by 51 days of age.  Your baby has fewer than 3 stools in a 24-hour period.  Your baby's skin or the white parts of his or her eyes become yellow. Get help right away if:  Your baby is overly tired (lethargic) and does not want to wake up and feed.  Your baby develops an unexplained fever. Summary  Breastfeeding offers many health benefits for infant and mothers.  Try to breastfeed your infant when he or she shows early signs of hunger.  Gently tickle or stroke your baby's lips with your finger or nipple to allow the baby to open his or her mouth. Bring the baby to your breast. Make sure that much of the areola is in your baby's mouth. Offer one side and burp the baby before you offer the other side.  Talk with your health care provider or lactation consultant if you have questions or you face problems as  you breastfeed. This information is not intended to replace advice given to you by your health care provider. Make sure you discuss any questions you have with your health care provider. Document Revised: 05/14/2017 Document Reviewed: 03/21/2016 Elsevier Patient Education  [employer].

## 2020-01-17 ENCOUNTER — Ambulatory Visit (INDEPENDENT_AMBULATORY_CARE_PROVIDER_SITE_OTHER): Payer: Medicaid Other | Admitting: Obstetrics and Gynecology

## 2020-01-17 ENCOUNTER — Encounter: Payer: Self-pay | Admitting: Obstetrics and Gynecology

## 2020-01-17 ENCOUNTER — Other Ambulatory Visit: Payer: Self-pay

## 2020-01-17 VITALS — BP 99/62 | HR 81 | Wt 147.9 lb

## 2020-01-17 DIAGNOSIS — O30042 Twin pregnancy, dichorionic/diamniotic, second trimester: Secondary | ICD-10-CM

## 2020-01-17 DIAGNOSIS — Z3402 Encounter for supervision of normal first pregnancy, second trimester: Secondary | ICD-10-CM

## 2020-01-17 DIAGNOSIS — Z131 Encounter for screening for diabetes mellitus: Secondary | ICD-10-CM

## 2020-01-17 DIAGNOSIS — Z3A23 23 weeks gestation of pregnancy: Secondary | ICD-10-CM

## 2020-01-17 LAB — POCT URINALYSIS DIPSTICK OB
Bilirubin, UA: NEGATIVE
Blood, UA: NEGATIVE
Glucose, UA: NEGATIVE
Ketones, UA: NEGATIVE
Leukocytes, UA: NEGATIVE
Nitrite, UA: NEGATIVE
POC,PROTEIN,UA: NEGATIVE
Spec Grav, UA: 1.01 (ref 1.010–1.025)
Urobilinogen, UA: 0.2 E.U./dL
pH, UA: 8 (ref 5.0–8.0)

## 2020-01-17 NOTE — Progress Notes (Signed)
Virtual Visit via Video Note  I connected with Amber Wilson on 01/17/20 at 10:00 AM EST by a video enabled telemedicine application and verified that I am speaking with the correct person using two identifiers.     I discussed the limitations of evaluation and management by telemedicine and the availability of in person appointments. The patient expressed understanding and agreed to proceed.  Location: Patient: Home Provider: Office   THERAPIST PROGRESS NOTE  Session Time:  10:00 am-10:45 am  Participation Level: Active  Behavioral Response: CasualAlertAnxious  Type of Therapy: Individual Therapy  Treatment Goals addressed: Coping  Interventions: CBT and Solution Focused  Case Summary: Amber Wilson is a 24 y.o. female who presents oriented x5 (person, place, situation, time and object), casually dressed, appropriately groomed, average height, average weight, and cooperative to address anxiety. Patient has minimal history of medical treatment. Patient has a history of mental health treatment including outpatient therapy and medication management. Patient denies suicidal and homicidal ideations. Patient denies psychosis including auditory and visual hallucinations. Patient denies substance abuse. Patient is at low risk for lethality at this time.  Physically: Patient continues to be tired due to her pregnancy.  Spiritually/values: No issues identified.  Relationships: Patient and her fiance are unable to pay rent this month due to changes in jobs. Patient is worried about telling her grandfather. She feels like he will be disappointed in her. Patient has some money where she can pay half the rent but feels like he wouldn't accept half and would want it all. Patient noted that he can be compassionate as well.  Emotionally/Mentally/Behavior: Patient's mood is anxious. She feels like due to her fiance not having consistent work/pay she was stressed. They had an argument and he got  another job. She feels like it will help relieve some of her stress. Patient feels like things are in a million little pieces right now with jobs, housing, etc. She doesn't want others to buy things for them or give them help, she feels like she will "owe" them and doesn't want to end up with her mother who didn't seem to be ready for a motherhood. Patient is trying to work on being independent. 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     Patient engaged in session. Patient responded well to interventions. Patient continues to meet criteria for Generalized Anxiety Disorder. Patient will continue in outpatient therapy due to being the least restrictive service to meet her needs. Patient made moderate progress on her goals.   Suicidal/Homicidal: Negativewithout intent/plan  Therapist Response: Therapist reviewed patient's recent thoughts and behaviors. Therapist utilized CBT to address anxiety. Therapist processed patient's feelings to identify triggers for anxiety. Therapist discussed with patient her triggers for anxiety related to finances and having others give them "help" during this time.    Plan: Return again in 3 weeks.  Diagnosis: Axis I: Generalized Anxiety Disorder    Axis II: No diagnosis   I discussed the assessment and treatment plan with the patient. The patient was provided an opportunity to ask questions and all were answered. The patient agreed with the plan and demonstrated an understanding of the instructions.   The patient was advised to call back or seek an in-person evaluation if the symptoms worsen or if the condition fails to improve as anticipated.   I provided 45 minutes of non-face-to-face time during this encounter.  [name], LCSW 01/17/2020

## 2020-01-17 NOTE — Progress Notes (Signed)
ROB: Is noting some occasional pressure in her abdomen. Can utilize belly band. Plans to breastfeed. RTC in 4 weeks, for 28 week labs and growth scan.  Discussed need for serial ultrasounds for growth during third trimester. Desires to hold off on flu vaccine until next visit.    The following were addressed during this visit:  Breastfeeding Education - The importance of exclusive breastfeeding    Comments: Provides antibodies, Lower risk of breast and ovarian cancers, and type-2 diabetes,Helps your body recover, Reduced chance of SIDS.   - Risks of giving your baby anything other than breast milk if you are breastfeeding    Comments: Make the baby less content with breastfeeds, may make my baby more susceptible to illness, and may reduce my milk supply.   - Exclusive breastfeeding for the first 6 months    Comments: Builds a healthy milk supply and keeps it up, protects baby from sickness and disease, and breastmilk has everything your baby needs for the first 6 months.

## 2020-01-30 ENCOUNTER — Ambulatory Visit (INDEPENDENT_AMBULATORY_CARE_PROVIDER_SITE_OTHER): Payer: No Typology Code available for payment source | Admitting: Licensed Clinical Social Worker

## 2020-01-30 DIAGNOSIS — F411 Generalized anxiety disorder: Secondary | ICD-10-CM | POA: Diagnosis not present

## 2020-01-30 NOTE — Progress Notes (Signed)
Virtual Visit via Video Note  I connected with Amber Wilson on 01/30/20 at 10:00 AM EST by a video enabled telemedicine application and verified that I am speaking with the correct person using two identifiers.     I discussed the limitations of evaluation and management by telemedicine and the availability of in person appointments. The patient expressed understanding and agreed to proceed.  Location: Patient: Home Provider: Office   THERAPIST PROGRESS NOTE  Session Time:  10:00 am-10:45 am  Participation Level: Active  Behavioral Response: CasualAlertAnxious  Type of Therapy: Individual Therapy  Treatment Goals addressed: Coping  Interventions: CBT and Solution Focused  Case Summary: Amber Wilson is a 24 y.o. female who presents oriented x5 (person, place, situation, time and object), casually dressed, appropriately groomed, average height, average weight, and cooperative to address anxiety. Patient has minimal history of medical treatment. Patient has a history of mental health treatment including outpatient therapy and medication management. Patient denies suicidal and homicidal ideations. Patient denies psychosis including auditory and visual hallucinations. Patient denies substance abuse. Patient is at low risk for lethality at this time.  Physically: Patient is managing her pregnancy. She is tired but is managing.  Spiritually/values: No issues identified.  Relationships: Patient spoke to her Grandfather about the rent. She said that it went better than expected. Patient is also hurt that her father will not be attending her babyshower. Patient noted that her father got mixed up on the dates and will be out of town. There were several steps that he could have taken to make sure he attended but failed to do so. Patient gets the impression that he doesn't care enough to listen but knows this isn't true.  Emotionally/Mentally/Behavior: Patient's mood has been down and  overwhelmed. Patient feels like there is a lot that is not in her control right now such as finances. She wants thing such as her baby shower to go as planned. She is excited for her baby shower. She is trying to balance being grateful but not being a brat when people don't buy what is on her registry or go against her plans for her baby shower.      Patient engaged in session. Patient responded well to interventions. Patient continues to meet criteria for Generalized Anxiety Disorder. Patient will continue in outpatient therapy due to being the least restrictive service to meet her needs. Patient made moderate progress on her goals.   Suicidal/Homicidal: Negativewithout intent/plan  Therapist Response: Therapist reviewed patient's recent thoughts and behaviors. Therapist utilized CBT to address anxiety. Therapist processed patient's feelings to identify triggers for anxiety. Therapist discussed with patient her relationships, her perceptions of events/situations which impact her mood, and "accepting" not having control over certain aspects of her life right now such as finances.   Plan: Return again in 3 weeks.  Diagnosis: Axis I: Generalized Anxiety Disorder    Axis II: No diagnosis   I discussed the assessment and treatment plan with the patient. The patient was provided an opportunity to ask questions and all were answered. The patient agreed with the plan and demonstrated an understanding of the instructions.   The patient was advised to call back or seek an in-person evaluation if the symptoms worsen or if the condition fails to improve as anticipated.   I provided 45 minutes of non-face-to-face time during this encounter.  [name], LCSW 01/30/2020

## 2020-02-02 ENCOUNTER — Other Ambulatory Visit: Payer: Self-pay | Admitting: Physician Assistant

## 2020-02-02 DIAGNOSIS — F419 Anxiety disorder, unspecified: Secondary | ICD-10-CM

## 2020-02-02 NOTE — Telephone Encounter (Signed)
   Notes to clinic Already had a curtesy refill, no upcoming appt scheduled.

## 2020-02-07 NOTE — Telephone Encounter (Signed)
Do you still refill this?

## 2020-02-09 NOTE — Telephone Encounter (Signed)
Called patient and left a message for patient to return call to schedule appointment. If patient calls back okay for PEC o schedule appointment.

## 2020-02-09 NOTE — Telephone Encounter (Signed)
Courtesy refill. She'll need to have a follow up, can be in person or virtual, for more refills as it has been > 1 year. Thanks!

## 2020-02-13 ENCOUNTER — Ambulatory Visit (INDEPENDENT_AMBULATORY_CARE_PROVIDER_SITE_OTHER): Payer: No Typology Code available for payment source | Admitting: Licensed Clinical Social Worker

## 2020-02-13 DIAGNOSIS — F411 Generalized anxiety disorder: Secondary | ICD-10-CM | POA: Diagnosis not present

## 2020-02-13 NOTE — Progress Notes (Signed)
Virtual Visit via Video Note  I connected with Amber Wilson on 02/13/20 at  1:00 PM EST by a video enabled telemedicine application and verified that I am speaking with the correct person using two identifiers.     I discussed the limitations of evaluation and management by telemedicine and the availability of in person appointments. The patient expressed understanding and agreed to proceed.  Location: Patient: Home Provider: Office   THERAPIST PROGRESS NOTE  Session Time:  1:00 pm-1:45 pm  Participation Level: Active  Behavioral Response: CasualAlertAnxious  Type of Therapy: Individual Therapy  Treatment Goals addressed: Coping  Interventions: CBT and Solution Focused  Case Summary: Amber Wilson is a 24 y.o. female who presents oriented x5 (person, place, situation, time and object), casually dressed, appropriately groomed, average height, average weight, and cooperative to address anxiety. Patient has minimal history of medical treatment. Patient has a history of mental health treatment including outpatient therapy and medication management. Patient denies suicidal and homicidal ideations. Patient denies psychosis including auditory and visual hallucinations. Patient denies substance abuse. Patient is at low risk for lethality at this time.  Physically: Patient is managing her pregnancy. She is tired and feels like her twins are growing quickly.   Spiritually/values: No issues identified.  Relationships: Patient went to see her father to help him with decorating the Christmas tree. She got frustrated while being their because her father's wife's daughter was not watching their 27 month old and she kept trying to unplug the Christmas lights. Patient also got annoyed with her stepmother because she keeps asking what patient wants for her babyshower. Patient has told her multiple times but her father and stepmother have not bought her what she has asked for. It annoys her that  they keep asking and backing out of getting her what she has asked them to get. Patient's father is not responding to her texts which she finds odd.  Emotionally/Mentally/Behavior: Patient's mood has been ok. Patient got a part time job to get out of the house and have money to pay for her car insurance. Patient also asked her fiancee to go back to his previous job so that they could have stability. He agreed. Patient is focusing on having stability at this time.      Patient engaged in session. Patient responded well to interventions. Patient continues to meet criteria for Generalized Anxiety Disorder. Patient will continue in outpatient therapy due to being the least restrictive service to meet her needs. Patient made moderate progress on her goals.   Suicidal/Homicidal: Negativewithout intent/plan  Therapist Response: Therapist reviewed patient's recent thoughts and behaviors. Therapist utilized CBT to address anxiety. Therapist processed patient's feelings to identify triggers for anxiety. Therapist discussed with patient her relationships, and finding stability in her life during this time.   Plan: Return again in 3 weeks.  Diagnosis: Axis I: Generalized Anxiety Disorder    Axis II: No diagnosis   I discussed the assessment and treatment plan with the patient. The patient was provided an opportunity to ask questions and all were answered. The patient agreed with the plan and demonstrated an understanding of the instructions.   The patient was advised to call back or seek an in-person evaluation if the symptoms worsen or if the condition fails to improve as anticipated.   I provided 45 minutes of non-face-to-face time during this encounter.  [name], LCSW 02/13/2020

## 2020-02-15 ENCOUNTER — Ambulatory Visit (INDEPENDENT_AMBULATORY_CARE_PROVIDER_SITE_OTHER): Payer: No Typology Code available for payment source | Admitting: Obstetrics and Gynecology

## 2020-02-15 ENCOUNTER — Encounter: Payer: Medicaid Other | Admitting: Obstetrics and Gynecology

## 2020-02-15 ENCOUNTER — Other Ambulatory Visit: Payer: Medicaid Other

## 2020-02-15 ENCOUNTER — Other Ambulatory Visit: Payer: Self-pay

## 2020-02-15 ENCOUNTER — Encounter: Payer: Self-pay | Admitting: Obstetrics and Gynecology

## 2020-02-15 ENCOUNTER — Other Ambulatory Visit (INDEPENDENT_AMBULATORY_CARE_PROVIDER_SITE_OTHER): Payer: No Typology Code available for payment source

## 2020-02-15 VITALS — BP 101/65 | HR 85 | Wt 150.8 lb

## 2020-02-15 DIAGNOSIS — Z131 Encounter for screening for diabetes mellitus: Secondary | ICD-10-CM

## 2020-02-15 DIAGNOSIS — Z3A27 27 weeks gestation of pregnancy: Secondary | ICD-10-CM

## 2020-02-15 DIAGNOSIS — Z3402 Encounter for supervision of normal first pregnancy, second trimester: Secondary | ICD-10-CM

## 2020-02-15 DIAGNOSIS — Z23 Encounter for immunization: Secondary | ICD-10-CM

## 2020-02-15 DIAGNOSIS — O30042 Twin pregnancy, dichorionic/diamniotic, second trimester: Secondary | ICD-10-CM

## 2020-02-15 LAB — POCT URINALYSIS DIPSTICK OB
Bilirubin, UA: NEGATIVE
Blood, UA: NEGATIVE
Glucose, UA: NEGATIVE
Ketones, UA: NEGATIVE
Leukocytes, UA: NEGATIVE
Nitrite, UA: NEGATIVE
POC,PROTEIN,UA: NEGATIVE
Spec Grav, UA: 1.01 (ref 1.010–1.025)
Urobilinogen, UA: 0.2 E.U./dL
pH, UA: 6.5 (ref 5.0–8.0)

## 2020-02-15 NOTE — Progress Notes (Signed)
ROB: Patient doing well.  Daily fetal movement.  Ultrasound for growth today shows 10% discrepancy.  Both baby is growing well.  Both babies vertex.  1 hour GCT today.  Flu Tdap today.

## 2020-02-15 NOTE — Addendum Note (Signed)
Addended by: Durwin Glaze on: 02/15/2020 01:39 PM   Modules accepted: Orders

## 2020-02-16 LAB — CBC
Hematocrit: 31.9 % — ABNORMAL LOW (ref 34.0–46.6)
Hemoglobin: 10.6 g/dL — ABNORMAL LOW (ref 11.1–15.9)
MCH: 29 pg (ref 26.6–33.0)
MCHC: 33.2 g/dL (ref 31.5–35.7)
MCV: 87 fL (ref 79–97)
Platelets: 201 10*3/uL (ref 150–450)
RBC: 3.65 x10E6/uL — ABNORMAL LOW (ref 3.77–5.28)
RDW: 12.2 % (ref 11.7–15.4)
WBC: 9.4 10*3/uL (ref 3.4–10.8)

## 2020-02-16 LAB — GLUCOSE, 1 HOUR GESTATIONAL: Gestational Diabetes Screen: 102 mg/dL (ref 65–139)

## 2020-02-16 LAB — RPR: RPR Ser Ql: NONREACTIVE

## 2020-02-16 LAB — HEPATITIS C ANTIBODY: Hep C Virus Ab: <0.1 s/co ratio (ref 0.0–0.9)

## 2020-02-16 MED ORDER — FERROUS SULFATE 325 (65 FE) MG PO TABS: 325.0000 mg | ORAL_TABLET | Freq: Every day | ORAL | 1 refills | Status: DC

## 2020-02-16 NOTE — Addendum Note (Signed)
Addended by: Augusto Gamble on: 02/16/2020 06:28 PM   Modules accepted: Orders

## 2020-02-28 NOTE — Progress Notes (Signed)
ROB-Pt present for routine prenatal care. Pt c/o lower abd pain/cramping.

## 2020-02-28 NOTE — Patient Instructions (Signed)
WHAT OB PATIENTS CAN EXPECT   Confirmation of pregnancy and ultrasound ordered if medically indicated-[redacted] weeks gestation  New OB (NOB) intake with nurse and New OB (NOB) labs- [redacted] weeks gestation  New OB (NOB) physical examination with provider- 11/[redacted] weeks gestation  Flu vaccine-[redacted] weeks gestation  Anatomy scan-[redacted] weeks gestation  Glucose tolerance test, blood work to test for anemia, T-dap vaccine-[redacted] weeks gestation  Vaginal swabs/cultures-STD/Group B strep-[redacted] weeks gestation  Appointments every 4 weeks until 28 weeks  Every 2 weeks from 28 weeks until 36 weeks  Weekly visits from 36 weeks until delivery  Third Trimester of Pregnancy  The third trimester is from week 28 through week 40 (months 7 through 9). This trimester is when your unborn baby (fetus) is growing very fast. At the end of the ninth month, the unborn baby is about 20 inches in length. It weighs about 6-10 pounds. Follow these instructions at home: Medicines  Take over-the-counter and prescription medicines only as told by your doctor. Some medicines are safe and some medicines are not safe during pregnancy.  Take a prenatal vitamin that contains at least 600 micrograms (mcg) of folic acid.  If you have trouble pooping (constipation), take medicine that will make your stool soft (stool softener) if your doctor approves. Eating and drinking   Eat regular, healthy meals.  Avoid raw meat and uncooked cheese.  If you get low calcium from the food you eat, talk to your doctor about taking a daily calcium supplement.  Eat four or five small meals rather than three large meals a day.  Avoid foods that are high in fat and sugars, such as fried and sweet foods.  To prevent constipation: ? Eat foods that are high in fiber, like fresh fruits and vegetables, whole grains, and beans. ? Drink enough fluids to keep your pee (urine) clear or pale yellow. Activity  Exercise only as told by your doctor. Stop  exercising if you start to have cramps.  Avoid heavy lifting, wear low heels, and sit up straight.  Do not exercise if it is too hot, too humid, or if you are in a place of great height (high altitude).  You may continue to have sex unless your doctor tells you not to. Relieving pain and discomfort  Wear a good support bra if your breasts are tender.  Take frequent breaks and rest with your legs raised if you have leg cramps or low back pain.  Take warm water baths (sitz baths) to soothe pain or discomfort caused by hemorrhoids. Use hemorrhoid cream if your doctor approves.  If you develop puffy, bulging veins (varicose veins) in your legs: ? Wear support hose or compression stockings as told by your doctor. ? Raise (elevate) your feet for 15 minutes, 3-4 times a day. ? Limit salt in your food. Safety  Wear your seat belt when driving.  Make a list of emergency phone numbers, including numbers for family, friends, the hospital, and police and fire departments. Preparing for your baby's arrival To prepare for the arrival of your baby:  Take prenatal classes.  Practice driving to the hospital.  Visit the hospital and tour the maternity area.  Talk to your work about taking leave once the baby comes.  Pack your hospital bag.  Prepare the baby's room.  Go to your doctor visits.  Buy a rear-facing car seat. Learn how to install it in your car. General instructions  Do not use hot tubs, steam rooms, or saunas.  Do not use any products that contain nicotine or tobacco, such as cigarettes and e-cigarettes. If you need help quitting, ask your doctor.  Do not drink alcohol.  Do not douche or use tampons or scented sanitary pads.  Do not cross your legs for long periods of time.  Do not travel for long distances unless you must. Only do so if your doctor says it is okay.  Visit your dentist if you have not gone during your pregnancy. Use a soft toothbrush to brush your  teeth. Be gentle when you floss.  Avoid cat litter boxes and soil used by cats. These carry germs that can cause birth defects in the baby and can cause a loss of your baby (miscarriage) or stillbirth.  Keep all your prenatal visits as told by your doctor. This is important. Contact a doctor if:  You are not sure if you are in labor or if your water has broken.  You are dizzy.  You have mild cramps or pressure in your lower belly.  You have a nagging pain in your belly area.  You continue to feel sick to your stomach, you throw up, or you have watery poop.  You have bad smelling fluid coming from your vagina.  You have pain when you pee. Get help right away if:  You have a fever.  You are leaking fluid from your vagina.  You are spotting or bleeding from your vagina.  You have severe belly cramps or pain.  You lose or gain weight quickly.  You have trouble catching your breath and have chest pain.  You notice sudden or extreme puffiness (swelling) of your face, hands, ankles, feet, or legs.  You have not felt the baby move in over an hour.  You have severe headaches that do not go away with medicine.  You have trouble seeing.  You are leaking, or you are having a gush of fluid, from your vagina before you are 37 weeks.  You have regular belly spasms (contractions) before you are 37 weeks. Summary  The third trimester is from week 28 through week 40 (months 7 through 9). This time is when your unborn baby is growing very fast.  Follow your doctor's advice about medicine, food, and activity.  Get ready for the arrival of your baby by taking prenatal classes, getting all the baby items ready, preparing the baby's room, and visiting your doctor to be checked.  Get help right away if you are bleeding from your vagina, or you have chest pain and trouble catching your breath, or if you have not felt your baby move in over an hour. This information is not intended to  replace advice given to you by your health care provider. Make sure you discuss any questions you have with your health care provider. Document Revised: 06/10/2018 Document Reviewed: 03/25/2016 Elsevier Patient Education  [place]. Common Medications Safe in Pregnancy  Acne:      Constipation:  Benzoyl Peroxide     Colace  Clindamycin      Dulcolax Suppository  Topica Erythromycin     Fibercon  Salicylic Acid      Metamucil         Miralax AVOID:        Senakot   Accutane    Cough:  Retin-A       Cough Drops  Tetracycline      Phenergan w/ Codeine if Rx  Minocycline      Robitussin (Plain &  DM)  Antibiotics:     Crabs/Lice:  Ceclor       RID  Cephalosporins    AVOID:  E-Mycins      Kwell  Keflex  Macrobid/Macrodantin   Diarrhea:  Penicillin      Kao-Pectate  Zithromax      Imodium AD         PUSH FLUIDS AVOID:       Cipro     Fever:  Tetracycline      Tylenol (Regular or Extra  Minocycline       Strength)  Levaquin      Extra Strength-Do not          Exceed 8 tabs/24 hrs Caffeine:        <250m/day (equiv. To 1 cup of coffee or  approx. 3 12 oz sodas)         Gas: Cold/Hayfever:       Gas-X  Benadryl      Mylicon  Claritin       Phazyme  **Claritin-D        Chlor-Trimeton    Headaches:  Dimetapp      ASA-Free Excedrin  Drixoral-Non-Drowsy     Cold Compress  Mucinex (Guaifenasin)     Tylenol (Regular or Extra  Sudafed/Sudafed-12 Hour     Strength)  **Sudafed PE Pseudoephedrine   Tylenol Cold & Sinus     Vicks Vapor Rub  Zyrtec  **AVOID if Problems With Blood Pressure         Heartburn: Avoid lying down for at least 1 hour after meals  Aciphex      Maalox     Rash:  Milk of Magnesia     Benadryl    Mylanta       1% Hydrocortisone Cream  Pepcid  Pepcid Complete   Sleep Aids:  Prevacid      Ambien   Prilosec       Benadryl  Rolaids       Chamomile Tea  Tums (Limit 4/day)     Unisom         Tylenol PM         Warm milk-add vanilla  or  Hemorrhoids:       Sugar for taste  Anusol/Anusol H.C.  (RX: Analapram 2.5%)  Sugar Substitutes:  Hydrocortisone OTC     Ok in moderation  Preparation H      Tucks        Vaseline lotion applied to tissue with wiping    Herpes:     Throat:  Acyclovir      Oragel  Famvir  Valtrex     Vaccines:         Flu Shot Leg Cramps:       *Gardasil  Benadryl      Hepatitis A         Hepatitis B Nasal Spray:       Pneumovax  Saline Nasal Spray     Polio Booster         Tetanus Nausea:       Tuberculosis test or PPD  Vitamin B6 25 mg TID   AVOID:    Dramamine      *Gardasil  Emetrol       Live Poliovirus  Ginger Root 250 mg QID    MMR (measles, mumps &  High Complex Carbs @ Bedtime    rebella)  Sea Bands-Accupressure    Varicella (Chickenpox)  Unisom 1/2 tab TID     *  No known complications           If received before Pain:         Known pregnancy;   Darvocet       Resume series after  Lortab        Delivery  Percocet    Yeast:   Tramadol      Femstat  Tylenol 3      Gyne-lotrimin  Ultram       Monistat  Vicodin           MISC:         All Sunscreens           Hair Coloring/highlights          Insect Repellant's          (Including DEET)         Mystic Tans

## 2020-02-29 ENCOUNTER — Telehealth: Payer: Self-pay

## 2020-02-29 ENCOUNTER — Encounter: Payer: Self-pay | Admitting: Obstetrics and Gynecology

## 2020-02-29 ENCOUNTER — Other Ambulatory Visit: Payer: Self-pay

## 2020-02-29 ENCOUNTER — Ambulatory Visit (INDEPENDENT_AMBULATORY_CARE_PROVIDER_SITE_OTHER): Payer: Medicaid Other | Admitting: Obstetrics and Gynecology

## 2020-02-29 VITALS — BP 107/76 | HR 96 | Wt 152.4 lb

## 2020-02-29 DIAGNOSIS — Z20822 Contact with and (suspected) exposure to covid-19: Secondary | ICD-10-CM

## 2020-02-29 DIAGNOSIS — Z3A29 29 weeks gestation of pregnancy: Secondary | ICD-10-CM

## 2020-02-29 DIAGNOSIS — O30043 Twin pregnancy, dichorionic/diamniotic, third trimester: Secondary | ICD-10-CM

## 2020-02-29 DIAGNOSIS — Z3403 Encounter for supervision of normal first pregnancy, third trimester: Secondary | ICD-10-CM

## 2020-02-29 NOTE — Telephone Encounter (Signed)
Pt called in and stated that she has been around someone who has been exposed to covid. The pt stated that she has been fully vaccinated and has no symptoms. The pt wanted to know if she needed to move her appointment. I told her no that she has been fully vaccinated and no symptoms. I told the pt to please wear a mask in the office. The pt verbally understood.

## 2020-02-29 NOTE — Progress Notes (Addendum)
ROB: Notes that she is experiencing some mild cramping in the pelvis.  Had normal 1hr glucola last visit and normal growth scan, twins both cephalic. Desires epidural for pain management. Continue growth scans q 4 weeks.  Reports recent exposure to COVID over Christmas holiday, has been vaccinated, denies any symptoms currently. Took a home COVID test which was negative. Continue to monitor for symptoms.  RTC in 2 weeks.    The following were addressed during this visit:  Breastfeeding Education - Nonpharmacological pain relief methods for labor    Comments: Deep breathing, focusing on pleasant things, movement and walking, heating pads or cold compress, massage and relaxation, continuous support from someone you trust, and Doulas   - The importance of early skin-to-skin contact    Comments: Keeps baby warm and secure, helps keep baby's blood sugar up and breathing steady, easier to bond and breastfeed, and helps calm baby.  - Rooming-in on a 24-hour basis    Comments: Easier to learn baby's feeding cues, easier to bond and get to know each other, and encourages milk production.

## 2020-03-02 ENCOUNTER — Telehealth: Payer: Self-pay

## 2020-03-02 NOTE — Telephone Encounter (Signed)
Pt called in and stated that she thinks that she has covid. The opt stated that she is getting tested today. The pt is requesting a call back, she is wanting to know what she can take over the courtier for it. I told her I will send a message to the nurse. Please advise

## 2020-03-02 NOTE — Telephone Encounter (Signed)
Pt called no answer LM via VM that I would send a safe medication list to her mychart. Pt was advised that if she noticed SOB, v/n more than 6-8 times a day, fever of over 100F please go to the ED.

## 2020-03-03 NOTE — L&D Delivery Note (Signed)
Delivery Summary for Amber Wilson  Labor Events:   Preterm labor: No data found  Rupture date: 04/18/2020  Rupture time: 12:35 PM  Rupture type: Artificial  Fluid Color: Clear  Induction: No data found  Augmentation: No data found  Complications: No data found  Cervical ripening: No data found No data found   No data found     Delivery:   Episiotomy: No data found  Lacerations: No data found  Repair suture: No data found  Repair # of packets: No data found  Blood loss (ml): 1180   Information for the patient's newborn:  Aniyiah, Zell [[id number]]    Delivery 04/18/2020 8:21 PM by  Vaginal, Spontaneous Sex:  female Gestational Age: [redacted]w[redacted]d Delivery Clinician:   Living?:         APGARS  One minute Five minutes Ten minutes  Skin color:        Heart rate:        Grimace:        Muscle tone:        Breathing:        Totals: 9  9      Presentation/position:      Resuscitation:   Cord information:    Disposition of cord blood:     Blood gases sent?  Complications:   Placenta: Delivered:       appearance Newborn Measurements: Weight: 5 lb 4.5 oz (2395 g)  Height: 18.5"  Head circumference:    Chest circumference:    Other providers:    Additional  information: Forceps:   Vacuum:   Breech:   Observed anomalies      Information for the patient's newborn:  [name], [name] [[id number]]    Delivery 04/18/2020 9:07 PM by  Vaginal, Spontaneous Sex:  female Gestational Age: [redacted]w[redacted]d Delivery Clinician:   Living?:         APGARS  One minute Five minutes Ten minutes  Skin color:        Heart rate:        Grimace:        Muscle tone:        Breathing:        Totals: 8  9      Presentation/position:      Resuscitation:   Cord information:    Disposition of cord blood:     Blood gases sent?  Complications:   Placenta: Delivered:       appearance Newborn Measurements: Weight: 4 lb 3 oz (1900 g)  Height: 17.32"  Head circumference:    Chest circumference:     Other providers:    Additional  information: Forceps:   Vacuum:   Breech:   Observed anomalies       Delivery Note   [name], [name] [[id number]]  At 8:21 PM a viable and healthy female was delivered via Vaginal, Spontaneous (Presentation: Vertex compound arm; ROA).  APGAR: 9, 9; weight 5 lb 4.5 oz (2395 g).    Placenta status: spontaneously removed, intact after delivery of Baby B.  Cord: 3-vessel with the following complications: none.     [name], [name] [[id number]]  After delivery of Baby A, a bedside ultrasound was performed.  Baby B noted be transverse (slightly oblique) with head to maternal right.  External version attempted, with counterclockwise rotation noting to move fetal body to breech position. Baby tolerated procedure well. At 9:07 PM a viable and healthy female was delivered via Vaginal, Spontaneous (Presentation: Complete breech).  APGAR: 8, 9; weight 4 lb 3 oz (1900 g) .    Placenta status: spontaneously removed, intact after delivery of Baby B.  Cord: 3-vessel with the following complications: none.  Baby B's cord clamped for identification in pathology.  Anesthesia:  Epidural Episiotomy: None Lacerations:  1st degree perineal; hemostatic Suture Repair: none Est. Blood Loss (mL):  1180 ml. Cytotec 800 mcg given rectally. Pitocin infused.    Mom to postpartum.   Baby A to Couplet care / Skin to Skin.   Baby B to Couplet care / Skin to Skin.  Rubie Maid, MD 04/18/2020, 9:44 PM

## 2020-03-05 ENCOUNTER — Ambulatory Visit (HOSPITAL_COMMUNITY): Payer: No Typology Code available for payment source | Admitting: Licensed Clinical Social Worker

## 2020-03-07 ENCOUNTER — Telehealth: Payer: Self-pay

## 2020-03-07 ENCOUNTER — Encounter: Payer: Self-pay | Admitting: Obstetrics and Gynecology

## 2020-03-07 ENCOUNTER — Other Ambulatory Visit: Payer: Self-pay | Admitting: Physician Assistant

## 2020-03-07 ENCOUNTER — Observation Stay
Admission: EM | Admit: 2020-03-07 | Discharge: 2020-03-07 | Disposition: A | Discharge status: Home / Self Care | Payer: No Typology Code available for payment source | Attending: Obstetrics and Gynecology | Admitting: Obstetrics and Gynecology

## 2020-03-07 ENCOUNTER — Other Ambulatory Visit: Payer: Self-pay

## 2020-03-07 DIAGNOSIS — R102 Pelvic and perineal pain: Secondary | ICD-10-CM | POA: Diagnosis not present

## 2020-03-07 DIAGNOSIS — O368131 Decreased fetal movements, third trimester, fetus 1: Secondary | ICD-10-CM | POA: Diagnosis not present

## 2020-03-07 DIAGNOSIS — Z8616 Personal history of COVID-19: Secondary | ICD-10-CM

## 2020-03-07 DIAGNOSIS — Z20822 Contact with and (suspected) exposure to covid-19: Secondary | ICD-10-CM | POA: Diagnosis not present

## 2020-03-07 DIAGNOSIS — Z3A30 30 weeks gestation of pregnancy: Secondary | ICD-10-CM | POA: Diagnosis not present

## 2020-03-07 DIAGNOSIS — F419 Anxiety disorder, unspecified: Secondary | ICD-10-CM

## 2020-03-07 NOTE — Telephone Encounter (Signed)
Spoke with patient and she stated that she has not felt one of the babies move since yesterday. Patient is having lower abdominal pain and pressure. Patient stated that she had a positive covid test last week and then tested negative. Patient denies having symptoms today. Due to not seeing patients in the clinic with positive test I have sent patient to L&D to be checked out. I have discussed with Dr. Valentino Saxon and she agreed. Patient verbalized understanding.

## 2020-03-07 NOTE — Final Progress Note (Signed)
L&D OB Triage Note  Amber Wilson is a 25 y.o. G60P0000 female at [redacted]w[redacted]d with di/di twin pregnancy, EDD Estimated Date of Delivery: 05/11/20 who presented to triage for complaints of pelvic cramping and pain, and decreased fetal movement of one of her babies.  She was evaluated by the nurses with no significant findings noted. After further discussion with patient, nurse notes that pain is mostly round ligament. Vital signs stable. An NST was performed and has been reviewed by MD.   Of note, patient also with recent COVID exposure ~ 2 weeks ago. Notes recently taking a test that was negative.     Physical Exam:  Blood pressure 121/74, pulse 72, temperature 98.3 F (36.8 C), temperature source Oral, resp. rate 17, last menstrual period 08/05/2019. Cervical exam deferred   NST INTERPRETATION (Baby A): Indications: decreased fetal movement and rule out uterine contractions  Mode: External Baseline Rate (A): 150 bpm Variability: Moderate Accelerations: 15 x 15 Decelerations: None       NST INTERPRETATION (Baby B): Indications: decreased fetal movement and rule out uterine contractions  Mode: External Baseline Rate (A): 140 bpm Variability: Moderate Accelerations: 15 x 15 Decelerations: None   Impression: reactive x 2   Plan: NST performed was reviewed and was found to be reactive. She was discharged home with fetal kick counts/preterm labor precautions. Given an abdominal binder upon discharge. Can also encourage use of Tylenol and warm baths. Continue routine prenatal care. Follow up with OB/GYN as previously scheduled.     Hildred Laser, MD  [facility]

## 2020-03-07 NOTE — Telephone Encounter (Signed)
Requested medication (s) are due for refill today: yes  Requested medication (s) are on the active medication list: yes  Last refill:  02/09/2020  Future visit scheduled: no  Notes to clinic:  message sent to patient to contact office for scheduling Overdue for office visit    Requested Prescriptions  Pending Prescriptions Disp Refills   busPIRone (BUSPAR) 15 MG tablet [Pharmacy Med Name: BUSPIRONE HCL 15 MG TABLET] 60 tablet 0    Sig: TAKE 1 TABLET BY MOUTH 2 TIMES DAILY.      Psychiatry: Anxiolytics/Hypnotics - Non-controlled Failed - 03/07/2020  1:37 AM      Failed - Valid encounter within last 6 months    Recent Outpatient Visits           8 months ago Erroneous encounter - disregard   [facility] [place], [place]   10 months ago Dizziness   [facility] [place], [place], [place]   1 year ago Anxiety   [facility] [place], [place], [place]   1 year ago Anxiety   [facility] [place], [place], [place]   1 year ago Anxiety   [facility] [place], [place]

## 2020-03-07 NOTE — Discharge Instructions (Signed)
Fetal Movement Counts Patient Name: ________________________________________________ Patient Due Date: ____________________ What is a fetal movement count?  A fetal movement count is the number of times that you feel your baby move during a certain amount of time. This may also be called a fetal kick count. A fetal movement count is recommended for every pregnant woman. You may be asked to start counting fetal movements as early as week 28 of your pregnancy. Pay attention to when your baby is most active. You may notice your baby's sleep and wake cycles. You may also notice things that make your baby move more. You should do a fetal movement count:  When your baby is normally most active.  At the same time each day. A good time to count movements is while you are resting, after having something to eat and drink. How do I count fetal movements? 1. Find a quiet, comfortable area. Sit, or lie down on your side. 2. Write down the date, the start time and stop time, and the number of movements that you felt between those two times. Take this information with you to your health care visits. 3. Write down your start time when you feel the first movement. 4. Count kicks, flutters, swishes, rolls, and jabs. You should feel at least 10 movements. 5. You may stop counting after you have felt 10 movements, or if you have been counting for 2 hours. Write down the stop time. 6. If you do not feel 10 movements in 2 hours, contact your health care provider for further instructions. Your health care provider may want to do additional tests to assess your baby's well-being. Contact a health care provider if:  You feel fewer than 10 movements in 2 hours.  Your baby is not moving like he or she usually does. Date: ____________ Start time: ____________ Stop time: ____________ Movements: ____________ Date: ____________ Start time: ____________ Stop time: ____________ Movements: ____________ Date:  ____________ Start time: ____________ Stop time: ____________ Movements: ____________ Date: ____________ Start time: ____________ Stop time: ____________ Movements: ____________ Date: ____________ Start time: ____________ Stop time: ____________ Movements: ____________ Date: ____________ Start time: ____________ Stop time: ____________ Movements: ____________ Date: ____________ Start time: ____________ Stop time: ____________ Movements: ____________ Date: ____________ Start time: ____________ Stop time: ____________ Movements: ____________ Date: ____________ Start time: ____________ Stop time: ____________ Movements: ____________ This information is not intended to replace advice given to you by your health care provider. Make sure you discuss any questions you have with your health care provider. Document Revised: 10/07/2018 Document Reviewed: 10/07/2018 Elsevier Patient Education  2020 Elsevier Inc[employer]   Round Ligament Pain  The round ligament is a cord of muscle and tissue that helps support the uterus. It can become a source of pain during pregnancy if it becomes stretched or twisted as the baby grows. The pain usually begins in the second trimester (13-28 weeks) of pregnancy, and it can come and go until the baby is delivered. It is not a serious problem, and it does not cause harm to the baby. Round ligament pain is usually a short, sharp, and pinching pain, but it can also be a dull, lingering, and aching pain. The pain is felt in the lower side of the abdomen or in the groin. It usually starts deep in the groin and moves up to the outside of the hip area. The pain may occur when you:  Suddenly change position, such as quickly going from a sitting to standing position.  Roll over in  bed.  Cough or sneeze.  Do physical activity. Follow these instructions at home:   Watch your condition for any changes.  When the pain starts, relax. Then try any of these methods to help with the  pain: ? Sitting down. ? Flexing your knees up to your abdomen. ? Lying on your side with one pillow under your abdomen and another pillow between your legs. ? Sitting in a warm bath for 15-20 minutes or until the pain goes away.  Take over-the-counter and prescription medicines only as told by your health care provider.  Move slowly when you sit down or stand up.  Avoid long walks if they cause pain.  Stop or reduce your physical activities if they cause pain.  Keep all follow-up visits as told by your health care provider. This is important. Contact a health care provider if:  Your pain does not go away with treatment.  You feel pain in your back that you did not have before.  Your medicine is not helping. Get help right away if:  You have a fever or chills.  You develop uterine contractions.  You have vaginal bleeding.  You have nausea or vomiting.  You have diarrhea.  You have pain when you urinate. Summary  Round ligament pain is felt in the lower abdomen or groin. It is usually a short, sharp, and pinching pain. It can also be a dull, lingering, and aching pain.  This pain usually begins in the second trimester (13-28 weeks). It occurs because the uterus is stretching with the growing baby, and it is not harmful to the baby.  You may notice the pain when you suddenly change position, when you cough or sneeze, or during physical activity.  Relaxing, flexing your knees to your abdomen, lying on one side, or taking a warm bath may help to get rid of the pain.  Get help from your health care provider if the pain does not go away or if you have vaginal bleeding, nausea, vomiting, diarrhea, or painful urination. This information is not intended to replace advice given to you by your health care provider. Make sure you discuss any questions you have with your health care provider. Document Revised: 08/05/2017 Document Reviewed: 08/05/2017 Elsevier Patient Education   [place].

## 2020-03-07 NOTE — Telephone Encounter (Signed)
Patient called in stating that she is not feeling baby "A" move as much. Patient states that she is usually pretty consistent and that she hasn't felt her move since last night. Patient is wanting to come in today for an ultrasound or to check on the baby's heart. Could you please advise?

## 2020-03-13 ENCOUNTER — Telehealth: Payer: Self-pay

## 2020-03-13 NOTE — Telephone Encounter (Signed)
mychart message sent to patient re: no visitors 

## 2020-03-14 ENCOUNTER — Ambulatory Visit (INDEPENDENT_AMBULATORY_CARE_PROVIDER_SITE_OTHER): Payer: No Typology Code available for payment source

## 2020-03-14 ENCOUNTER — Ambulatory Visit (INDEPENDENT_AMBULATORY_CARE_PROVIDER_SITE_OTHER): Payer: Medicaid Other | Admitting: Obstetrics and Gynecology

## 2020-03-14 ENCOUNTER — Other Ambulatory Visit: Payer: Self-pay

## 2020-03-14 ENCOUNTER — Encounter: Payer: Self-pay | Admitting: Obstetrics and Gynecology

## 2020-03-14 VITALS — BP 100/68 | HR 81 | Wt 153.6 lb

## 2020-03-14 DIAGNOSIS — O30043 Twin pregnancy, dichorionic/diamniotic, third trimester: Secondary | ICD-10-CM | POA: Diagnosis not present

## 2020-03-14 DIAGNOSIS — Z3403 Encounter for supervision of normal first pregnancy, third trimester: Secondary | ICD-10-CM

## 2020-03-14 NOTE — Progress Notes (Signed)
ROB: Patient had growth ultrasound today- discrepancy-14%.  She has no problems.  Previously had COVID but is now over that.  She is scheduled to get her booster.  Reports daily fetal movement.  Denies contractions.  Both babies vertex at this time.

## 2020-03-15 ENCOUNTER — Ambulatory Visit (INDEPENDENT_AMBULATORY_CARE_PROVIDER_SITE_OTHER): Payer: No Typology Code available for payment source | Admitting: Licensed Clinical Social Worker

## 2020-03-15 DIAGNOSIS — F411 Generalized anxiety disorder: Secondary | ICD-10-CM | POA: Diagnosis not present

## 2020-03-15 NOTE — Progress Notes (Signed)
  THERAPIST PROGRESS NOTE  Session Time: 10:00 am-10:45 am  Type of Therapy: Individual Therapy   Purpose of Session/Treatment Goals addressed: "[place] will manage anxiety as evidenced by reducing panic attacks, managing triggers, and coping with stressors for 5 out of 7 days for 60 days."  Interventions: Therapist utilized CBT and Solution focused brief therapy to address anxiety. Therapist provided support and empathy to patient while she shared her thoughts and feelings. Therapist had patient verbalize her triggers for anxiety. Therapist assisted patient in "playing out" her fear through verbal exposure to the situation feared. Therapist assisted patient in identifying what she is in control of,  what is out of her control, and accepting what she can't control.  Effectiveness: Patient was oriented x5 (person, place, situation, time, and object). Patient was neatly groomed, and dressed. Patient maintained eye contact and became emotional/tearful at times when discussing the health of her grandfather. Patient is pregnant with twins and almost due. She is tired. Patient had Covid and most of her family has had it as well. Her grandfather currently has it and she is worried for him because he has COPD as well. She worries he may pass away. Patient was able to tearfully explain how it would be for her and her family if her grandfather passed away. Patient also considered the other side of that fear/worry, that her grandfather would recover and be ok. Patient feels like her home is still "chaotic" due to not being able to clean due to being sick and pregnant with twins. She hasn't packed a bag for the hospital either. Patient feels like there are several things out of her control such as her grandfather's health and things that are in her control like cleaning, preparing for the hospital (pack a bag), and resting. She is going to focus on what she can control and accept what is out of her control as "it is  what it is."      Patient engaged in session. Patient responded well to interventions. Patient continues to meet criteria for Generalized Anxiety Disorder. Patient will continue in outpatient therapy due to being the least restrictive service to meet her needs. Patient made moderate progress on her goals.   Suicidal/Homicidal: Negativewithout intent/plan  Plan: Return again in 3 weeks. Patient will focus on what is in her control and take action on those items and work on accepting what she can't change.  Diagnosis: Axis I: Generalized Anxiety Disorder    Axis II: No diagnosis    [name], LCSW 03/15/2020

## 2020-03-19 ENCOUNTER — Ambulatory Visit (HOSPITAL_COMMUNITY): Payer: No Typology Code available for payment source | Admitting: Licensed Clinical Social Worker

## 2020-03-28 ENCOUNTER — Other Ambulatory Visit: Payer: Self-pay

## 2020-03-28 ENCOUNTER — Encounter: Payer: Medicaid Other | Admitting: Obstetrics and Gynecology

## 2020-03-28 ENCOUNTER — Ambulatory Visit (INDEPENDENT_AMBULATORY_CARE_PROVIDER_SITE_OTHER): Payer: Medicaid Other | Admitting: Obstetrics and Gynecology

## 2020-03-28 ENCOUNTER — Encounter: Payer: Self-pay | Admitting: Obstetrics and Gynecology

## 2020-03-28 VITALS — BP 119/75 | HR 102 | Ht 63.0 in | Wt 161.2 lb

## 2020-03-28 DIAGNOSIS — O30043 Twin pregnancy, dichorionic/diamniotic, third trimester: Secondary | ICD-10-CM

## 2020-03-28 DIAGNOSIS — O99891 Other specified diseases and conditions complicating pregnancy: Secondary | ICD-10-CM

## 2020-03-28 DIAGNOSIS — M549 Dorsalgia, unspecified: Secondary | ICD-10-CM

## 2020-03-28 DIAGNOSIS — Z3403 Encounter for supervision of normal first pregnancy, third trimester: Secondary | ICD-10-CM

## 2020-03-28 DIAGNOSIS — Z3A33 33 weeks gestation of pregnancy: Secondary | ICD-10-CM

## 2020-03-28 LAB — POCT URINALYSIS DIPSTICK OB
Bilirubin, UA: NEGATIVE
Blood, UA: NEGATIVE
Glucose, UA: NEGATIVE
Leukocytes, UA: NEGATIVE
Nitrite, UA: NEGATIVE
Spec Grav, UA: 1.02 (ref 1.010–1.025)
Urobilinogen, UA: 0.2 E.U./dL
pH, UA: 6 (ref 5.0–8.0)

## 2020-03-28 NOTE — Progress Notes (Signed)
ROB-Pt present for routine prenatal care. Pt c/o entire abd pain/pressure along with mid-lower back pain and increase in vaginal discharge more watery.

## 2020-03-28 NOTE — Patient Instructions (Signed)
Breastfeeding Twins or Multiples Choosing to breastfeed your babies has many benefits. Breast milk is beneficial for all babies, but it is especially beneficial to multiples, who are often small at birth and need all the advantages breast milk can provide. Mothers of multiples typically produce enough milk for all their babies. How does breastfeeding benefit me? Choosing to breastfeed has many benefits.  It helps improve your recovery from giving birth by: ? Reducing blood loss after delivery. ? Speeding up how quickly your uterus heals. ? Reducing your risk of a type of depression that happens after giving birth (postpartum depression).  It increases the time before your menstrual periods return. This can help to delay pregnancy if you are not using birth control.  It creates a unique bond between you and each of your babies. How does breastfeeding benefit my babies? Choosing to breastfeed your babies has many benefits:  It ensures your babies get the best nutrition.  It helps develop your babies' disease-fighting systems (immune systems) by providing antibodies that help fight off germs.  It may lower your babies' risk for: ? Problems with the stomach and intestines. ? Allergies. ? Ear infections. ? Infections of the nose, throat, or airways (respiratory infections). ? Obesity. ? Diabetes. ? Sudden infant death syndrome (SIDS). What actions can I take to help with breastfeeding? Nurse as soon as possible and as often as your babies want to be nursed. This will cause your body to produce enough milk for all your babies. Work with a breastfeeding specialist (Science writer) as soon as possible. It is important that each baby can latch and feed. If your babies were born early (prematurely) and are unable to nurse, you can pump your breasts and freeze the milk until your babies are ready to feed at the breast. To make sure that your body makes enough milk, empty your breast at least  8-10 times in a 24-hour period. Ask a lactation consultant to help you choose an effective breast pump. Also, ask for guidance in helping your babies latch on to and feed from the breast when they are ready. In addition, consider joining a support group for breastfeeding moms of twins and multiples. Breastfeeding recommendations It is up to you whether you nurse your babies together or separately. However, many mothers of multiples find that it is easier and time-saving to nurse two babies at the same time. Nursing babies together may also help establish your milk supply. Nursing two babies at the same time can be tricky at first, but it often gets easier as the babies get older and more experienced at latching on to the breast. When nursing your babies together:  Ask your nurse or lactation consultant to suggest tips on positioning. There are several positions and holds that make it easier to nurse more than one baby at a time.  Try placing pillows under your arms and legs and under your babies for comfort. Use a nursing pillow specially designed for multiples.  You may want to switch your babies from one side to the other at alternate feedings. For example, start by feeding baby A from the right breast and baby B from the left breast. Then at the next feeding, baby A should take the left breast and baby B the right breast. This ensures that your breasts get equal amounts of stimulation. It also may allow a baby who has a stronger suck to increase the milk supply for the baby who may have a suck that is  weaker.   General recommendations  Make sure your babies have a good latch. This helps the babies empty the breasts. It also prevents sore nipples.  If you are nursing two babies together, try nursing one baby separately if he or she is having trouble latching or sucking. You can then give that baby your full attention.  If you are nursing two babies separately, try nursing them together if one of  the babies is having trouble feeding. The baby with the stronger or more effective suck will stimulate your milk to flow faster.  Limit the use of bottles and pacifiers during the first 3-4 weeks of breastfeeding. Doing so may encourage more effective sucking patterns and help establish a good milk supply. You should not need supplemental feedings if you empty your breasts with each feeding.  Listen for swallowing sounds and watch for swallowing while you are nursing. A lactation consultant can help you learn this.  Drink enough fluid to keep your urine pale yellow.  Eat a healthy diet that includes fresh fruits and vegetables, whole grains, lean meat, fish, eggs, beans, nuts, seeds, and low-fat dairy products. Follow these instructions at home: Track your babies' wet diapers Track wet diapers for the first 6 weeks to make sure each baby is getting enough milk. Signs that your babies are getting enough milk include:  Wetting at least 1 or 2 diapers each in the first 24 hours after birth.  Wetting at least 5 or 6 diapers per baby every 24 hours for the first week after birth. The urine should be pale yellow by 5 days after birth.  Wetting 6-8 diapers per baby every 24 hours as your babies grow and develop. Track your babies' stools Track each baby's stools for the first 6 weeks to make sure each baby is getting enough milk. Here are signs that your babies are getting enough milk:  By the time your babies are 33 days old, they should each have at least 3 stools in a 24-hour period. The stools should be soft and yellow.  By the time your babies are 76 days old, they should each have at least 3 stools in a 24-hour period. The stools should be seedy and yellow if you are feeding your babies with breast milk only (exclusive breastfeeding). General instructions  Talk with a lactation consultant if your babies do not get enough milk from breastfeeding alone. It may be possible to breastfeed part of  the time and supplement feedings with donated milk or formula.  Talk to your health care provider about how much weight your babies should be gaining. Where to find more information  [employer]: www.llli.org Contact a health care provider if:  Your nipples are cracked or bleeding.  Your breasts are red, tender, or warm.  You have: ? Painful breasts or nipples. ? A swollen area on either breast. ? A fever or chills. ? Nausea or vomiting. ? Drainage other than breast milk from your nipples.  Your have a fever. Get help right away if:  Your baby is: ? Too sleepy to eat well. ? Having trouble sleeping. ? More than 54 week old and wetting fewer than 6 diapers in a 24-hour period. ? Not gaining weight by 73 days of age. Summary  Breastfeeding is beneficial for twins and multiples, and for their mothers.  Nurse your babies as soon as possible after birth. You can nurse two babies at one time.  Work with a Science writer to  assess your babies' latches, to find positioning and breastfeeding strategies that work for you, and to overcome any nursing challenges. This information is not intended to replace advice given to you by your health care provider. Make sure you discuss any questions you have with your health care provider. Document Revised: 08/17/2019 Document Reviewed: 08/17/2019 Elsevier Patient Education  [place].

## 2020-03-28 NOTE — Progress Notes (Signed)
ROB: Patient noting more pelvic pressure and back pain. Also noting some cramping. Growth discordance 13% on last ultrasound (03/14/20), vertex x 2. For repeat in 2 weeks.  Discussed endpoint for pregnancy, usually 38 weeks is considered full term for di-di twins, advised on possibility of IOL if no signs of labor before then. Discussed COVID policy for visitors. RTC in 2 weeks, for 36 week cultures at that time.    US OB Follow Up AddL Gest Patient Name: Amber Wilson DOB: 09/15/1995 MRN: [id number] ULTRASOUND REPORT  Location: [facility] Date of Service: 03/14/2020   Indications: Growth/AFI  Findings: Twin Di/Di intrauterine pregnancy is visualized with   Baby A FHR at 129 BPM.  Fetal presentation is Cephalic.  Placenta: anterior, anterior. Grade: 1 GLP: 4.3cm , subjectively normal.  Weight 1594 g ( 3lbs 8 oz) 31 %ile  Baby B FHR at 168  BPM.  Fetal presentation is Cephalic.  Placenta: posterior. Grade: 1 GLP 4.4 cm , subjectively normal.  Weight 1372 g ( 3lbs 0 oz) 17 %ile.   Impression: 1. 78 w 5d Viable Twin Di/Di Intrauterine pregnancy previously established  criteria. 2. Baby A 31%ile, Baby B 17%ile. 3. Baby A GLP 4.3 cm, Baby B GLP 4.4 cm.  Recommendations: 1.Clinical correlation with the patient's History and Physical Exam.   Jenine M. Albertine Grates RDMS  The ultrasound images and findings were reviewed by me and I agree with  the above report.  Finis Bud, M.D. 03/19/2020 9:26 AM

## 2020-03-29 ENCOUNTER — Ambulatory Visit (HOSPITAL_COMMUNITY): Payer: No Typology Code available for payment source | Admitting: Licensed Clinical Social Worker

## 2020-04-06 ENCOUNTER — Other Ambulatory Visit: Payer: Self-pay | Admitting: Physician Assistant

## 2020-04-06 DIAGNOSIS — F419 Anxiety disorder, unspecified: Secondary | ICD-10-CM

## 2020-04-06 NOTE — Telephone Encounter (Signed)
Left message for patient to call the office and schedule an appt.

## 2020-04-06 NOTE — Telephone Encounter (Signed)
Requested medications are due for refill today yes  Requested medications are on the active medication list yes  Last refill 1/6  Last visit 02/2019  Future visit scheduled no  Notes to clinic Failed protocol due to no valid visit within 6  months.

## 2020-04-09 ENCOUNTER — Ambulatory Visit (INDEPENDENT_AMBULATORY_CARE_PROVIDER_SITE_OTHER): Payer: No Typology Code available for payment source | Admitting: Licensed Clinical Social Worker

## 2020-04-09 DIAGNOSIS — F411 Generalized anxiety disorder: Secondary | ICD-10-CM

## 2020-04-09 NOTE — Progress Notes (Signed)
Virtual Visit via Telephone Note  I connected with Amber Wilson on 04/09/20 at  2:00 PM EST by telephone and verified that I am speaking with the correct person using two identifiers.  Location: Patient: Home Provider: Office   I discussed the limitations, risks, security and privacy concerns of performing an evaluation and management service by telephone and the availability of in person appointments. I also discussed with the patient that there may be a patient responsible charge related to this service. The patient expressed understanding and agreed to proceed.   THERAPIST PROGRESS NOTE  Session Time: 2:00 pm-2:45 pm  Type of Therapy: Individual Therapy   Purpose of Session/Treatment Goals addressed: "Amber Wilson will manage anxiety as evidenced by reducing panic attacks, managing triggers, and coping with stressors for 5 out of 7 days for 60 days."  Interventions: Therapist utilized CBT and Solution Focused brief therapy to address anxiety. Therapist provided support and empathy to patient as well as space for her to share her thoughts and feelings in session. Therapist explored patient's triggers for anxiety and mood. Therapist assisted patient in identifying ways to stay occupied until she goes to give birth.  Effectiveness: Patient was oriented x5 (person, place,situation, time, and object). Patient was pleasant, alert, and engaged in treatment. Patient has started to go into active labor. She is in a lot of discomfort and has back labor. Patient has had an increase in irritability due to her discomfort and feels like people are constantly asking about her pregnancy which annoys her. She has her birth plan and expectations. She is a little frustrated with her mother due to her mother saying she doesn't want to be in the room during labor. Patient understands but still wants her mother there. She is going to ask another family member to be there along with her fiance. Patient is resting, and  doing small tasks to stay occupied. She is spending time with her family which helps her stay occupied and calm. She is feeling restless not being able to do much and is ready to give birth.      Patient engaged in session. Patient responded well to interventions. Patient continues to meet criteria for Generalized Anxiety Disorder. Patient will continue in outpatient therapy due to being the least restrictive service to meet her needs. Patient made moderate progress on her goals.   Suicidal/Homicidal: Negativewithout intent/plan  Plan: Return again in 3 weeks. Patient is in active labor (just started) but is not able to go to the hospital yet.   Diagnosis: Axis I: Generalized Anxiety Disorder    Axis II: No diagnosis   I discussed the assessment and treatment plan with the patient. The patient was provided an opportunity to ask questions and all were answered. The patient agreed with the plan and demonstrated an understanding of the instructions.   The patient was advised to call back or seek an in-person evaluation if the symptoms worsen or if the condition fails to improve as anticipated.  I provided 45 minutes of non-face-to-face time during this encounter.  [name], LCSW 04/09/2020

## 2020-04-11 ENCOUNTER — Other Ambulatory Visit (HOSPITAL_COMMUNITY)
Admission: RE | Admit: 2020-04-11 | Discharge: 2020-04-11 | Disposition: A | Discharge status: Home / Self Care | Payer: No Typology Code available for payment source | Source: Ambulatory Visit | Attending: Obstetrics and Gynecology | Admitting: Obstetrics and Gynecology

## 2020-04-11 ENCOUNTER — Ambulatory Visit (INDEPENDENT_AMBULATORY_CARE_PROVIDER_SITE_OTHER): Payer: No Typology Code available for payment source | Admitting: Obstetrics and Gynecology

## 2020-04-11 ENCOUNTER — Ambulatory Visit (INDEPENDENT_AMBULATORY_CARE_PROVIDER_SITE_OTHER): Payer: No Typology Code available for payment source

## 2020-04-11 ENCOUNTER — Other Ambulatory Visit: Payer: Self-pay

## 2020-04-11 ENCOUNTER — Encounter: Payer: Medicaid Other | Admitting: Obstetrics and Gynecology

## 2020-04-11 ENCOUNTER — Encounter: Payer: Self-pay | Admitting: Obstetrics and Gynecology

## 2020-04-11 ENCOUNTER — Ambulatory Visit: Payer: No Typology Code available for payment source | Admitting: Physician Assistant

## 2020-04-11 VITALS — BP 104/68 | HR 65 | Wt 159.5 lb

## 2020-04-11 DIAGNOSIS — Z3403 Encounter for supervision of normal first pregnancy, third trimester: Secondary | ICD-10-CM | POA: Insufficient documentation

## 2020-04-11 DIAGNOSIS — O30043 Twin pregnancy, dichorionic/diamniotic, third trimester: Secondary | ICD-10-CM | POA: Diagnosis not present

## 2020-04-11 DIAGNOSIS — Z3A35 35 weeks gestation of pregnancy: Secondary | ICD-10-CM | POA: Diagnosis present

## 2020-04-11 LAB — POCT URINALYSIS DIPSTICK OB
Appearance: NORMAL
Bilirubin, UA: NEGATIVE
Blood, UA: NEGATIVE
Glucose, UA: NEGATIVE
Ketones, UA: NEGATIVE
Leukocytes, UA: NEGATIVE
Nitrite, UA: NEGATIVE
Odor: NORMAL
Spec Grav, UA: 1.02 (ref 1.010–1.025)
Urobilinogen, UA: 0.2 E.U./dL
pH, UA: 6 (ref 5.0–8.0)

## 2020-04-11 NOTE — Progress Notes (Signed)
ROB: Doing well.  Describes contractions every 30 to 45 minutes.  Reports daily fetal movement.  Ultrasound today shows possible growth restriction.  Baby A vertex baby B breech.  Risk benefits of vaginal versus cesarean delivery discussed.  Patient to consider her options.  Refer to MFM for growth and Doppler tomorrow.

## 2020-04-11 NOTE — Progress Notes (Signed)
She has been having some back pain and cramps that wake her up while she is sleeping. She has had some vomiting and can't sleep at night.

## 2020-04-11 NOTE — Progress Notes (Signed)
Established patient visit   Patient: Amber Wilson   DOB: 1995/11/19   24 y.o. Female  MRN: [id number] Visit Date: 04/12/2020  Today's healthcare provider: Trinna Post, PA-C   Chief Complaint  Patient presents with  . Anxiety   Subjective    HPI  Anxiety, Follow-up  She was last seen for anxiety 1 years ago. Changes made at last visit include continue buspar 15 mg BID and Lexapro 10 mg QD. Currently pregnant with twins and due in 2-3 weeks.    She reports good compliance with treatment. She reports good tolerance of treatment. She is not having side effects.   She feels her anxiety is moderate and Unchanged since last visit.  Symptoms: No chest pain No difficulty concentrating  No dizziness No fatigue  No feelings of losing control No insomnia  No irritable No palpitations  No panic attacks No racing thoughts  No shortness of breath No sweating  No tremors/shakes    GAD-7 Results GAD-7 Generalized Anxiety Disorder Screening Tool 12/02/2019 04/22/2019 02/16/2019  1. Feeling Nervous, Anxious, or on Edge 1 1 3   2. Not Being Able to Stop or Control Worrying 2 2 3   3. Worrying Too Much About Different Things 2 2 3   4. Trouble Relaxing 2 2 3   5. Being So Restless it's Hard To Sit Still 1 3 3   6. Becoming Easily Annoyed or Irritable 3 3 2   7. Feeling Afraid As If Something Awful Might Happen 1 3 3   Total GAD-7 Score 12 16 20   Difficulty At Work, Home, or Getting  Along With Others? Somewhat difficult Somewhat difficult Very difficult    PHQ-9 Scores PHQ9 SCORE ONLY 04/12/2020 02/16/2019 08/23/2018  PHQ-9 Total Score 9 17 11     ---------------------------------------------------------------------------------------------------      Medications: Outpatient Medications Prior to Visit  Medication Sig  . ferrous sulfate (FERROUSUL) 325 (65 FE) MG tablet Take 1 tablet (325 mg total) by mouth daily with breakfast.  . meclizine (ANTIVERT) 25 MG tablet Take 1  tablet (25 mg total) by mouth 3 (three) times daily as needed for dizziness.  . ondansetron (ZOFRAN) 4 MG tablet Take 1 tablet (4 mg total) by mouth every 8 (eight) hours as needed for nausea or vomiting.  . Prenatal Vit-Fe Fumarate-FA (MULTIVITAMIN-PRENATAL) 27-0.8 MG TABS tablet Take 1 tablet by mouth daily at 12 noon.  . [DISCONTINUED] busPIRone (BUSPAR) 15 MG tablet TAKE 1 TABLET BY MOUTH 2 TIMES DAILY.  . [DISCONTINUED] escitalopram (LEXAPRO) 10 MG tablet TAKE 1 TABLET BY MOUTH EVERY DAY   No facility-administered medications prior to visit.    Review of Systems  Psychiatric/Behavioral: Negative for agitation, decreased concentration, sleep disturbance and suicidal ideas. The patient is not nervous/anxious.        Objective    BP 116/75 (BP Location: Left Arm, Patient Position: Sitting, Cuff Size: Large)   Pulse 84   Temp 97.8 F (36.6 C) (Oral)   Wt 160 lb (72.6 kg)   LMP 08/05/2019   SpO2 98%   BMI 28.34 kg/m     Physical Exam Constitutional:      Appearance: Normal appearance.  Cardiovascular:     Rate and Rhythm: Normal rate and regular rhythm.     Heart sounds: Normal heart sounds.  Pulmonary:     Effort: Pulmonary effort is normal.     Breath sounds: Normal breath sounds.  Skin:    General: Skin is warm and dry.  Neurological:  General: No focal deficit present.     Mental Status: She is alert and oriented to person, place, and time. Mental status is at baseline.  Psychiatric:        Mood and Affect: Mood normal.        Behavior: Behavior normal.       No results found for any visits on 04/12/20.  Assessment & Plan    1. Anxiety and depression  Continue medications. Let patient know she is due for PAP when she is ready.   2. Anxiety  - busPIRone (BUSPAR) 15 MG tablet; Take 1 tablet (15 mg total) by mouth 2 (two) times daily.  Dispense: 60 tablet; Refill: 11 - escitalopram (LEXAPRO) 10 MG tablet; Take 1 tablet (10 mg total) by mouth daily.   Dispense: 30 tablet; Refill: 11   Return in about 1 year (around 04/12/2021) for anxiety .      ITrinna Post, PA-C, have reviewed all documentation for this visit. The documentation on 04/12/20 for the exam, diagnosis, procedures, and orders are all accurate and complete.  The entirety of the information documented in the History of Present Illness, Review of Systems and Physical Exam were personally obtained by me. Portions of this information were initially documented by [facility] and reviewed by me for thoroughness and accuracy.     Paulene Floor  [facility] [phone] (phone) [phone] (fax)  [place]

## 2020-04-12 ENCOUNTER — Ambulatory Visit (HOSPITAL_BASED_OUTPATIENT_CLINIC_OR_DEPARTMENT_OTHER): Payer: No Typology Code available for payment source

## 2020-04-12 ENCOUNTER — Encounter: Payer: Self-pay | Admitting: Physician Assistant

## 2020-04-12 ENCOUNTER — Other Ambulatory Visit: Payer: Self-pay

## 2020-04-12 ENCOUNTER — Ambulatory Visit (INDEPENDENT_AMBULATORY_CARE_PROVIDER_SITE_OTHER): Payer: No Typology Code available for payment source | Admitting: Physician Assistant

## 2020-04-12 ENCOUNTER — Other Ambulatory Visit: Payer: Self-pay | Admitting: Obstetrics and Gynecology

## 2020-04-12 ENCOUNTER — Observation Stay
Admission: RE | Admit: 2020-04-12 | Discharge: 2020-04-12 | Disposition: A | Discharge status: Home / Self Care | Payer: No Typology Code available for payment source | Source: Ambulatory Visit | Attending: Obstetrics and Gynecology | Admitting: Obstetrics and Gynecology

## 2020-04-12 ENCOUNTER — Ambulatory Visit (HOSPITAL_BASED_OUTPATIENT_CLINIC_OR_DEPARTMENT_OTHER): Payer: No Typology Code available for payment source | Admitting: Maternal & Fetal Medicine

## 2020-04-12 ENCOUNTER — Ambulatory Visit
Admission: RE | Admit: 2020-04-12 | Discharge: 2020-04-12 | Disposition: A | Discharge status: Home / Self Care | Payer: No Typology Code available for payment source | Source: Ambulatory Visit | Attending: Obstetrics and Gynecology | Admitting: Obstetrics and Gynecology

## 2020-04-12 VITALS — BP 116/75 | HR 84 | Temp 97.8°F | Wt 160.0 lb

## 2020-04-12 DIAGNOSIS — O365930 Maternal care for other known or suspected poor fetal growth, third trimester, not applicable or unspecified: Secondary | ICD-10-CM | POA: Insufficient documentation

## 2020-04-12 DIAGNOSIS — Z87891 Personal history of nicotine dependence: Secondary | ICD-10-CM | POA: Insufficient documentation

## 2020-04-12 DIAGNOSIS — Z3A35 35 weeks gestation of pregnancy: Secondary | ICD-10-CM | POA: Insufficient documentation

## 2020-04-12 DIAGNOSIS — O321XX2 Maternal care for breech presentation, fetus 2: Secondary | ICD-10-CM | POA: Diagnosis not present

## 2020-04-12 DIAGNOSIS — O30043 Twin pregnancy, dichorionic/diamniotic, third trimester: Secondary | ICD-10-CM

## 2020-04-12 DIAGNOSIS — Z3A25 25 weeks gestation of pregnancy: Secondary | ICD-10-CM

## 2020-04-12 DIAGNOSIS — F419 Anxiety disorder, unspecified: Secondary | ICD-10-CM

## 2020-04-12 DIAGNOSIS — F32A Depression, unspecified: Secondary | ICD-10-CM | POA: Diagnosis not present

## 2020-04-12 DIAGNOSIS — O365932 Maternal care for other known or suspected poor fetal growth, third trimester, fetus 2: Secondary | ICD-10-CM | POA: Insufficient documentation

## 2020-04-12 DIAGNOSIS — Z3403 Encounter for supervision of normal first pregnancy, third trimester: Secondary | ICD-10-CM

## 2020-04-12 DIAGNOSIS — O360190 Maternal care for anti-D [Rh] antibodies, unspecified trimester, not applicable or unspecified: Secondary | ICD-10-CM | POA: Diagnosis not present

## 2020-04-12 MED ORDER — ESCITALOPRAM OXALATE 10 MG PO TABS: 10.0000 mg | ORAL_TABLET | Freq: Every day | ORAL | 11 refills | Status: DC

## 2020-04-12 MED ORDER — BUSPIRONE HCL 15 MG PO TABS: 15.0000 mg | ORAL_TABLET | Freq: Two times a day (BID) | ORAL | 11 refills | Status: DC

## 2020-04-12 MED ORDER — BETAMETHASONE SOD PHOS & ACET 6 (3-3) MG/ML IJ SUSP
12.0000 mg | Freq: Once | INTRAMUSCULAR | Status: DC
  Filled 2020-04-12: qty 2

## 2020-04-12 MED ORDER — BETAMETHASONE SOD PHOS & ACET 6 (3-3) MG/ML IJ SUSP
12.0000 mg | INTRAMUSCULAR | Status: DC
  Administered 2020-04-12: 12 mg via INTRAMUSCULAR

## 2020-04-12 NOTE — Progress Notes (Unsigned)
Patient presents to Labor and Delivery for outpatient betamethasone injection.  Dr. Gertie Exon entered order in [facility] to administer Betamethasone once today and again in 24 hours.

## 2020-04-12 NOTE — Patient Instructions (Signed)
http://NIMH.NIH.Gov">  Generalized Anxiety Disorder, Adult Generalized anxiety disorder (GAD) is a mental health condition. Unlike normal worries, anxiety related to GAD is not triggered by a specific event. These worries do not fade or get better with time. GAD interferes with relationships, work, and school. GAD symptoms can vary from mild to severe. People with severe GAD can have intense waves of anxiety with physical symptoms that are similar to panic attacks. What are the causes? The exact cause of GAD is not known, but the following are believed to have an impact:  Differences in natural brain chemicals.  Genes passed down from parents to children.  Differences in the way threats are perceived.  Development during childhood.  Personality. What increases the risk? The following factors may make you more likely to develop this condition:  Being female.  Having a family history of anxiety disorders.  Being very shy.  Experiencing very stressful life events, such as the death of a loved one.  Having a very stressful family environment. What are the signs or symptoms? People with GAD often worry excessively about many things in their lives, such as their health and family. Symptoms may also include:  Mental and emotional symptoms: ? Worrying excessively about natural disasters. ? Fear of being late. ? Difficulty concentrating. ? Fears that others are judging your performance.  Physical symptoms: ? Fatigue. ? Headaches, muscle tension, muscle twitches, trembling, or feeling shaky. ? Feeling like your heart is pounding or beating very fast. ? Feeling out of breath or like you cannot take a deep breath. ? Having trouble falling asleep or staying asleep, or experiencing restlessness. ? Sweating. ? Nausea, diarrhea, or irritable bowel syndrome (IBS).  Behavioral symptoms: ? Experiencing erratic moods or irritability. ? Avoidance of new situations. ? Avoidance of  people. ? Extreme difficulty making decisions. How is this diagnosed? This condition is diagnosed based on your symptoms and medical history. You will also have a physical exam. Your health care provider may perform tests to rule out other possible causes of your symptoms. To be diagnosed with GAD, a person must have anxiety that:  Is out of his or her control.  Affects several different aspects of his or her life, such as work and relationships.  Causes distress that makes him or her unable to take part in normal activities.  Includes at least three symptoms of GAD, such as restlessness, fatigue, trouble concentrating, irritability, muscle tension, or sleep problems. Before your health care provider can confirm a diagnosis of GAD, these symptoms must be present more days than they are not, and they must last for 6 months or longer. How is this treated? This condition may be treated with:  Medicine. Antidepressant medicine is usually prescribed for long-term daily control. Anti-anxiety medicines may be added in severe cases, especially when panic attacks occur.  Talk therapy (psychotherapy). Certain types of talk therapy can be helpful in treating GAD by providing support, education, and guidance. Options include: ? Cognitive behavioral therapy (CBT). People learn coping skills and self-calming techniques to ease their physical symptoms. They learn to identify unrealistic thoughts and behaviors and to replace them with more appropriate thoughts and behaviors. ? Acceptance and commitment therapy (ACT). This treatment teaches people how to be mindful as a way to cope with unwanted thoughts and feelings. ? Biofeedback. This process trains you to manage your body's response (physiological response) through breathing techniques and relaxation methods. You will work with a therapist while machines are used to monitor your physical   symptoms.  Stress management techniques. These include yoga,  meditation, and exercise. A mental health specialist can help determine which treatment is best for you. Some people see improvement with one type of therapy. However, other people require a combination of therapies.   Follow these instructions at home: Lifestyle  Maintain a consistent routine and schedule.  Anticipate stressful situations. Create a plan, and allow extra time to work with your plan.  Practice stress management or self-calming techniques that you have learned from your therapist or your health care provider. General instructions  Take over-the-counter and prescription medicines only as told by your health care provider.  Understand that you are likely to have setbacks. Accept this and be kind to yourself as you persist to take better care of yourself.  Recognize and accept your accomplishments, even if you judge them as small.  Keep all follow-up visits as told by your health care provider. This is important. Contact a health care provider if:  Your symptoms do not get better.  Your symptoms get worse.  You have signs of depression, such as: ? A persistently sad or irritable mood. ? Loss of enjoyment in activities that used to bring you joy. ? Change in weight or eating. ? Changes in sleeping habits. ? Avoiding friends or family members. ? Loss of energy for normal tasks. ? Feelings of guilt or worthlessness. Get help right away if:  You have serious thoughts about hurting yourself or others. If you ever feel like you may hurt yourself or others, or have thoughts about taking your own life, get help right away. Go to your nearest emergency department or:  Call your local emergency services (911 in the [place]  Call a suicide crisis helpline, such as the National Suicide Prevention Lifeline at 1-800-273-8255. This is open 24 hours a day in the [place]  Text the Crisis Text Line at 741741 (in the [place] Summary  Generalized anxiety disorder (GAD) is a mental  health condition that involves worry that is not triggered by a specific event.  People with GAD often worry excessively about many things in their lives, such as their health and family.  GAD may cause symptoms such as restlessness, trouble concentrating, sleep problems, frequent sweating, nausea, diarrhea, headaches, and trembling or muscle twitching.  A mental health specialist can help determine which treatment is best for you. Some people see improvement with one type of therapy. However, other people require a combination of therapies. This information is not intended to replace advice given to you by your health care provider. Make sure you discuss any questions you have with your health care provider. Document Revised: 12/08/2018 Document Reviewed: 12/08/2018 Elsevier Patient Education  2021 Elsevier Inc.  

## 2020-04-12 NOTE — Discharge Instructions (Signed)
Return to labor and delivery at 1430 Friday for 2nd betamethasone injection

## 2020-04-12 NOTE — Progress Notes (Signed)
MFM Consultation  Requesting provider: Dr. Jeannie Fend Reason for Request: [place] Twin pregnancy with new IUGR in Twin B. Date of Service: 04/11/20  Amber Wilson is a 25 yo G1P0 at 79 w 6 d with an EDD of 05/11/2020 spontaneous diamniotic dichorionic twin pregnancy with recent concern for fetal growth restriction. EFW  On 04/11/20 the EFW for Twin A was  12th% and that for Twin B was the 5th% at their offices. There was normal amniotic fluid.   She seen today at the request of Dr. Amalia Hailey.  Overall her pregnancy has been uncomplicated. Today she is doing well without signs/symptoms of preeclampsia or preterm labor. She has a low risk Mat 21.  Vitals with BMI 04/12/2020 04/12/2020 04/11/2020  Height 5\' 3"  - -  Weight 159 lbs 8 oz 160 lbs 159 lbs 8 oz  BMI 28.26 95.63 87.56  Systolic 433 295 188  Diastolic 73 75 68  Pulse 78 84 65   OB History  Gravida Para Term Preterm AB Living  1 0 0 0 0 0  SAB IAB Ectopic Multiple Live Births  0 0 0 0 0    # Outcome Date GA Lbr Len/2nd Weight Sex Delivery Anes PTL Lv  1 Current            Past Medical History:  Diagnosis Date  . Allergy   . Anxiety   . Depression   . GERD (gastroesophageal reflux disease)    Past Surgical History:  Procedure Laterality Date  . TONSILLECTOMY    . WISDOM TOOTH EXTRACTION     Family History  Problem Relation Age of Onset  . Healthy Mother   . Healthy Father   . Healthy Maternal Grandmother   . Healthy Maternal Grandfather   . Healthy Paternal Grandmother   . Healthy Paternal Grandfather    Social History   Socioeconomic History  . Marital status: Single    Spouse name: Not on file  . Number of children: Not on file  . Years of education: Not on file  . Highest education level: Not on file  Occupational History  . Not on file  Tobacco Use  . Smoking status: Former Smoker    Quit date: 02/15/2016    Years since quitting: 4.1  . Smokeless tobacco: Never Used  Vaping Use  .  Vaping Use: Never used  Substance and Sexual Activity  . Alcohol use: Never  . Drug use: Not Currently    Frequency: 21.0 times per week    Types: Marijuana    Comment: 3-4 times per day  . Sexual activity: Yes  Other Topics Concern  . Not on file  Social History Narrative  . Not on file   Social Determinants of Health   Financial Resource Strain: Not on file  Food Insecurity: Not on file  Transportation Needs: Not on file  Physical Activity: Not on file  Stress: Not on file  Social Connections: Not on file  Intimate Partner Violence: Not on file   Imaging: Today we observed a normal twin growth of Twin A at the 17th% with normal UA Dopplers, Twin B's EFW was the 1st% with high normal UA Dopplers. Biophysical profile was 8/8 for twin A and B.  Twin discordance is at 19% today as well.  Impression/Counseling:  I reviewed with Amber Wilson today's examination. I discussed the diagnosis of fetal growth restriction with Twin B. I reviewed the criteria for diagnosis with an EFW of < 10th% or AC <  10th%. She does not have risk factor such as chronic hypertension, lupus, or prior history of preeclampsia. Her only risk factor is a twin gestation.  I explained the complications related to FGR given the EFW <3% results in poor outcomes including increased NRFHT, cesarean delivery and stillbirth.   Given this risk and current gestational age at 12 w 6 d I recommend delivery between 77-37 weeks.   We ordered a BMZ to be given today and a repeat tomorrow.   The twins have a vertex/breech presentation. We discussed the risk and benefits of a breech twin extraction. We discussed that it is provider preference and experience level. We also explained that often twins with FGR have an increased risk for not tolerating labor and may require a cesarean delivery.  Amber Wilson and her mother were able to ask all necessary questions. All questions answered.   I spent 45 minutes with > 50% in  face to face consultation.  Vikki Ports, MD.

## 2020-04-12 NOTE — Addendum Note (Signed)
Addended by: Finis Bud on: 04/12/2020 09:17 AM   Modules accepted: Orders

## 2020-04-13 ENCOUNTER — Observation Stay
Admission: RE | Admit: 2020-04-13 | Discharge: 2020-04-13 | Disposition: A | Discharge status: Home / Self Care | Payer: No Typology Code available for payment source | Attending: Obstetrics and Gynecology | Admitting: Obstetrics and Gynecology

## 2020-04-13 ENCOUNTER — Telehealth: Payer: Self-pay

## 2020-04-13 ENCOUNTER — Other Ambulatory Visit: Payer: Self-pay | Admitting: Obstetrics and Gynecology

## 2020-04-13 DIAGNOSIS — O360190 Maternal care for anti-D [Rh] antibodies, unspecified trimester, not applicable or unspecified: Secondary | ICD-10-CM | POA: Diagnosis present

## 2020-04-13 DIAGNOSIS — O365932 Maternal care for other known or suspected poor fetal growth, third trimester, fetus 2: Secondary | ICD-10-CM

## 2020-04-13 DIAGNOSIS — Z3A36 36 weeks gestation of pregnancy: Secondary | ICD-10-CM | POA: Insufficient documentation

## 2020-04-13 DIAGNOSIS — O30043 Twin pregnancy, dichorionic/diamniotic, third trimester: Secondary | ICD-10-CM

## 2020-04-13 LAB — CERVICOVAGINAL ANCILLARY ONLY
Chlamydia: NEGATIVE
Comment: NEGATIVE
Comment: NORMAL
Neisseria Gonorrhea: NEGATIVE

## 2020-04-13 MED ORDER — BETAMETHASONE SOD PHOS & ACET 6 (3-3) MG/ML IJ SUSP
12.0000 mg | Freq: Once | INTRAMUSCULAR | Status: AC
  Administered 2020-04-13: 12 mg via INTRAMUSCULAR

## 2020-04-13 NOTE — Discharge Instructions (Signed)
Induction 2/16 at 0500 AM [facility]

## 2020-04-13 NOTE — Telephone Encounter (Signed)
New message    Patient went for internal  feta medicine on yesterday was advise to delivery within 37 weeks, wanted to make sure Dr. Amalia Hailey was aware.

## 2020-04-13 NOTE — OB Triage Note (Signed)
Pt here for second dose of BMZ. Pt notified that Dr. Marcelline Mates put her on for IOL 04/18/20. Pt verbalized understanding that she needs to come to [facility] for [facility] test on 2/14. Pt has no complaints.

## 2020-04-13 NOTE — Telephone Encounter (Signed)
She will be scheduled for induction. I sent her a Mychart message and she also was on L&D today so I called and told them to let her know.

## 2020-04-13 NOTE — Telephone Encounter (Signed)
Please advise. Thanks Rajah Tagliaferro 

## 2020-04-14 NOTE — Progress Notes (Signed)
Patient presented for second betamethasone injection. No issues. Scheduled for IOL next week, 2/16.

## 2020-04-16 ENCOUNTER — Other Ambulatory Visit: Payer: Self-pay

## 2020-04-16 ENCOUNTER — Ambulatory Visit (INDEPENDENT_AMBULATORY_CARE_PROVIDER_SITE_OTHER): Payer: No Typology Code available for payment source | Admitting: Certified Nurse Midwife

## 2020-04-16 ENCOUNTER — Other Ambulatory Visit
Admission: RE | Admit: 2020-04-16 | Discharge: 2020-04-16 | Disposition: A | Discharge status: Home / Self Care | Payer: No Typology Code available for payment source | Source: Ambulatory Visit | Attending: Obstetrics and Gynecology | Admitting: Obstetrics and Gynecology

## 2020-04-16 VITALS — BP 126/72 | HR 84 | Wt 167.6 lb

## 2020-04-16 DIAGNOSIS — O365932 Maternal care for other known or suspected poor fetal growth, third trimester, fetus 2: Secondary | ICD-10-CM

## 2020-04-16 DIAGNOSIS — Z20822 Contact with and (suspected) exposure to covid-19: Secondary | ICD-10-CM | POA: Insufficient documentation

## 2020-04-16 DIAGNOSIS — Z3689 Encounter for other specified antenatal screening: Secondary | ICD-10-CM

## 2020-04-16 DIAGNOSIS — Z01812 Encounter for preprocedural laboratory examination: Secondary | ICD-10-CM | POA: Diagnosis present

## 2020-04-16 DIAGNOSIS — O30043 Twin pregnancy, dichorionic/diamniotic, third trimester: Secondary | ICD-10-CM

## 2020-04-16 DIAGNOSIS — Z3A36 36 weeks gestation of pregnancy: Secondary | ICD-10-CM | POA: Diagnosis not present

## 2020-04-16 LAB — POCT URINALYSIS DIPSTICK OB
Bilirubin, UA: NEGATIVE
Blood, UA: NEGATIVE
Glucose, UA: NEGATIVE
Ketones, UA: NEGATIVE
Leukocytes, UA: NEGATIVE
Nitrite, UA: NEGATIVE
POC,PROTEIN,UA: NEGATIVE
Spec Grav, UA: <=1.005 — AB (ref 1.010–1.025)
Urobilinogen, UA: 0.2 E.U./dL
pH, UA: 6.5 (ref 5.0–8.0)

## 2020-04-16 LAB — CULTURE, BETA STREP (GROUP B ONLY): Strep Gp B Culture: NEGATIVE

## 2020-04-16 NOTE — Patient Instructions (Signed)
Fetal Movement Counts Patient Name: ________________________________________________ Patient Due Date: ____________________  What is a fetal movement count? A fetal movement count is the number of times that you feel your baby move during a certain amount of time. This may also be called a fetal kick count. A fetal movement count is recommended for every pregnant woman. You may be asked to start counting fetal movements as early as week 28 of your pregnancy. Pay attention to when your baby is most active. You may notice your baby's sleep and wake cycles. You may also notice things that make your baby move more. You should do a fetal movement count:  When your baby is normally most active.  At the same time each day. A good time to count movements is while you are resting, after having something to eat and drink. How do I count fetal movements? 1. Find a quiet, comfortable area. Sit, or lie down on your side. 2. Write down the date, the start time and stop time, and the number of movements that you felt between those two times. Take this information with you to your health care visits. 3. Write down your start time when you feel the first movement. 4. Count kicks, flutters, swishes, rolls, and jabs. You should feel at least 10 movements. 5. You may stop counting after you have felt 10 movements, or if you have been counting for 2 hours. Write down the stop time. 6. If you do not feel 10 movements in 2 hours, contact your health care provider for further instructions. Your health care provider may want to do additional tests to assess your baby's well-being. Contact a health care provider if:  You feel fewer than 10 movements in 2 hours.  Your baby is not moving like he or she usually does. Date: ____________ Start time: ____________ Stop time: ____________ Movements: ____________ Date: ____________ Start time: ____________ Stop time: ____________ Movements: ____________ Date: ____________  Start time: ____________ Stop time: ____________ Movements: ____________ Date: ____________ Start time: ____________ Stop time: ____________ Movements: ____________ Date: ____________ Start time: ____________ Stop time: ____________ Movements: ____________ Date: ____________ Start time: ____________ Stop time: ____________ Movements: ____________ Date: ____________ Start time: ____________ Stop time: ____________ Movements: ____________ Date: ____________ Start time: ____________ Stop time: ____________ Movements: ____________ Date: ____________ Start time: ____________ Stop time: ____________ Movements: ____________ This information is not intended to replace advice given to you by your health care provider. Make sure you discuss any questions you have with your health care provider. Document Revised: 10/07/2018 Document Reviewed: 10/07/2018 Elsevier Patient Education  [place].

## 2020-04-16 NOTE — Progress Notes (Signed)
OB-Pt present for NST only.

## 2020-04-16 NOTE — Progress Notes (Signed)
NSTs today reactive, see chart. Anticipatory guidance regarding scheduled IOL. Reviewed red flag symptoms and when to call. Reports to [employer] at [facility] for IOL as previously scheduled or sooner if needed.

## 2020-04-16 NOTE — Addendum Note (Signed)
Addended by: Edwyna Shell on: 04/16/2020 04:45 PM   Modules accepted: Orders

## 2020-04-17 LAB — SARS CORONAVIRUS 2 (TAT 6-24 HRS): SARS Coronavirus 2: NEGATIVE

## 2020-04-18 ENCOUNTER — Encounter
Admission: RE | Disposition: A | Discharge status: Home / Self Care | Payer: Self-pay | Source: Home / Self Care | Attending: Obstetrics and Gynecology

## 2020-04-18 ENCOUNTER — Inpatient Hospital Stay
Admission: RE | Admit: 2020-04-18 | Discharge: 2020-04-20 | DRG: 806 | Disposition: A | Discharge status: Home / Self Care | Payer: No Typology Code available for payment source | Attending: Obstetrics and Gynecology | Admitting: Obstetrics and Gynecology

## 2020-04-18 ENCOUNTER — Encounter: Payer: Self-pay | Admitting: Obstetrics and Gynecology

## 2020-04-18 ENCOUNTER — Other Ambulatory Visit: Payer: Self-pay

## 2020-04-18 ENCOUNTER — Inpatient Hospital Stay: Payer: No Typology Code available for payment source | Admitting: Registered Nurse

## 2020-04-18 ENCOUNTER — Encounter: Payer: No Typology Code available for payment source | Admitting: Obstetrics and Gynecology

## 2020-04-18 DIAGNOSIS — F32A Depression, unspecified: Secondary | ICD-10-CM | POA: Diagnosis present

## 2020-04-18 DIAGNOSIS — D649 Anemia, unspecified: Secondary | ICD-10-CM | POA: Diagnosis not present

## 2020-04-18 DIAGNOSIS — Z87891 Personal history of nicotine dependence: Secondary | ICD-10-CM | POA: Diagnosis not present

## 2020-04-18 DIAGNOSIS — O9902 Anemia complicating childbirth: Secondary | ICD-10-CM | POA: Diagnosis not present

## 2020-04-18 DIAGNOSIS — O30043 Twin pregnancy, dichorionic/diamniotic, third trimester: Principal | ICD-10-CM | POA: Diagnosis present

## 2020-04-18 DIAGNOSIS — F419 Anxiety disorder, unspecified: Secondary | ICD-10-CM | POA: Diagnosis not present

## 2020-04-18 DIAGNOSIS — Z3A36 36 weeks gestation of pregnancy: Secondary | ICD-10-CM

## 2020-04-18 DIAGNOSIS — Z8659 Personal history of other mental and behavioral disorders: Secondary | ICD-10-CM

## 2020-04-18 DIAGNOSIS — O365932 Maternal care for other known or suspected poor fetal growth, third trimester, fetus 2: Secondary | ICD-10-CM | POA: Diagnosis not present

## 2020-04-18 DIAGNOSIS — O365931 Maternal care for other known or suspected poor fetal growth, third trimester, fetus 1: Secondary | ICD-10-CM

## 2020-04-18 DIAGNOSIS — O99344 Other mental disorders complicating childbirth: Secondary | ICD-10-CM | POA: Diagnosis present

## 2020-04-18 DIAGNOSIS — Z8616 Personal history of COVID-19: Secondary | ICD-10-CM

## 2020-04-18 DIAGNOSIS — O321XX2 Maternal care for breech presentation, fetus 2: Secondary | ICD-10-CM | POA: Diagnosis not present

## 2020-04-18 DIAGNOSIS — O99340 Other mental disorders complicating pregnancy, unspecified trimester: Secondary | ICD-10-CM | POA: Diagnosis present

## 2020-04-18 DIAGNOSIS — Z880 Allergy status to penicillin: Secondary | ICD-10-CM | POA: Diagnosis not present

## 2020-04-18 LAB — CBC
HCT: 33 % — ABNORMAL LOW (ref 36.0–46.0)
Hemoglobin: 10.9 g/dL — ABNORMAL LOW (ref 12.0–15.0)
MCH: 27.8 pg (ref 26.0–34.0)
MCHC: 33 g/dL (ref 30.0–36.0)
MCV: 84.2 fL (ref 80.0–100.0)
Platelets: 228 10*3/uL (ref 150–400)
RBC: 3.92 MIL/uL (ref 3.87–5.11)
RDW: 13.9 % (ref 11.5–15.5)
WBC: 12.1 10*3/uL — ABNORMAL HIGH (ref 4.0–10.5)
nRBC: 0 % (ref 0.0–0.2)

## 2020-04-18 LAB — TYPE AND SCREEN
ABO/RH(D): O POS
Antibody Screen: NEGATIVE

## 2020-04-18 LAB — RAPID HIV SCREEN (HIV 1/2 AB+AG)
HIV 1/2 Antibodies: NONREACTIVE
HIV-1 P24 Antigen - HIV24: NONREACTIVE

## 2020-04-18 LAB — RPR: RPR Ser Ql: NONREACTIVE

## 2020-04-18 LAB — ABO/RH: ABO/RH(D): O POS

## 2020-04-18 SURGERY — Surgical Case
Anesthesia: Epidural

## 2020-04-18 MED ORDER — MISOPROSTOL 25 MCG QUARTER TABLET
50.0000 ug | ORAL_TABLET | ORAL | Status: DC | PRN
  Administered 2020-04-18: 50 ug via VAGINAL
  Filled 2020-04-18: qty 1
  Filled 2020-04-18: qty 2
  Filled 2020-04-18: qty 1

## 2020-04-18 MED ORDER — LACTATED RINGERS IV SOLN
500.0000 mL | INTRAVENOUS | Status: DC | PRN
  Administered 2020-04-18: 1000 mL via INTRAVENOUS

## 2020-04-18 MED ORDER — BUPIVACAINE LIPOSOME 1.3 % IJ SUSP
INTRAMUSCULAR | Status: AC
  Filled 2020-04-18: qty 20

## 2020-04-18 MED ORDER — LACTATED RINGERS IV SOLN
125.0000 mL/h | INTRAVENOUS | Status: DC
  Administered 2020-04-18 (×2): 125 mL/h via INTRAVENOUS

## 2020-04-18 MED ORDER — BUPIVACAINE HCL (PF) 0.25 % IJ SOLN
INTRAMUSCULAR | Status: DC | PRN
  Administered 2020-04-18: 4 mL via EPIDURAL
  Administered 2020-04-18: 2 mL via EPIDURAL

## 2020-04-18 MED ORDER — OXYCODONE-ACETAMINOPHEN 5-325 MG PO TABS
2.0000 | ORAL_TABLET | ORAL | Status: DC | PRN
Start: 2020-04-18 — End: 2020-04-19

## 2020-04-18 MED ORDER — OXYTOCIN 10 UNIT/ML IJ SOLN
INTRAMUSCULAR | Status: AC
  Filled 2020-04-18: qty 2

## 2020-04-18 MED ORDER — LIDOCAINE HCL (PF) 1 % IJ SOLN: 30.0000 mL | INTRAMUSCULAR | Status: DC | PRN

## 2020-04-18 MED ORDER — CLINDAMYCIN PHOSPHATE 900 MG/50ML IV SOLN
900.0000 mg | Freq: Three times a day (TID) | INTRAVENOUS | Status: DC
  Filled 2020-04-18 (×2): qty 50

## 2020-04-18 MED ORDER — LIDOCAINE HCL (PF) 1 % IJ SOLN
INTRAMUSCULAR | Status: DC | PRN
  Administered 2020-04-18: 3 mL via SUBCUTANEOUS

## 2020-04-18 MED ORDER — LIDOCAINE HCL (PF) 1 % IJ SOLN
INTRAMUSCULAR | Status: AC
  Filled 2020-04-18: qty 30

## 2020-04-18 MED ORDER — SOD CITRATE-CITRIC ACID 500-334 MG/5ML PO SOLN
30.0000 mL | ORAL | Status: DC | PRN
  Filled 2020-04-18: qty 15

## 2020-04-18 MED ORDER — FENTANYL 2.5 MCG/ML W/ROPIVACAINE 0.15% IN NS 100 ML EPIDURAL (ARMC)
EPIDURAL | Status: DC | PRN
  Administered 2020-04-18: 12 mL/h via EPIDURAL

## 2020-04-18 MED ORDER — LIDOCAINE-EPINEPHRINE (PF) 1.5 %-1:200000 IJ SOLN
INTRAMUSCULAR | Status: DC | PRN
  Administered 2020-04-18: 3 mL via EPIDURAL

## 2020-04-18 MED ORDER — FENTANYL 2.5 MCG/ML W/ROPIVACAINE 0.15% IN NS 100 ML EPIDURAL (ARMC)
EPIDURAL | Status: AC
  Filled 2020-04-18: qty 100

## 2020-04-18 MED ORDER — OXYCODONE-ACETAMINOPHEN 5-325 MG PO TABS: 1.0000 | ORAL_TABLET | ORAL | Status: DC | PRN

## 2020-04-18 MED ORDER — MISOPROSTOL 200 MCG PO TABS
ORAL_TABLET | ORAL | Status: AC
  Administered 2020-04-18: 800 ug
  Filled 2020-04-18: qty 4

## 2020-04-18 MED ORDER — ACETAMINOPHEN 325 MG PO TABS: 650.0000 mg | ORAL_TABLET | ORAL | Status: DC | PRN

## 2020-04-18 MED ORDER — OXYTOCIN-SODIUM CHLORIDE 30-0.9 UT/500ML-% IV SOLN
1.0000 m[IU]/min | INTRAVENOUS | Status: DC
  Administered 2020-04-18: 4 m[IU]/min via INTRAVENOUS

## 2020-04-18 MED ORDER — TERBUTALINE SULFATE 1 MG/ML IJ SOLN: 0.2500 mg | Freq: Once | INTRAMUSCULAR | Status: DC | PRN

## 2020-04-18 MED ORDER — MISOPROSTOL 200 MCG PO TABS
ORAL_TABLET | ORAL | Status: AC
  Filled 2020-04-18: qty 4

## 2020-04-18 MED ORDER — SODIUM CHLORIDE (PF) 0.9 % IJ SOLN
INTRAMUSCULAR | Status: AC
  Filled 2020-04-18: qty 50

## 2020-04-18 MED ORDER — CARBOPROST TROMETHAMINE 250 MCG/ML IM SOLN
INTRAMUSCULAR | Status: AC
  Filled 2020-04-18: qty 1

## 2020-04-18 MED ORDER — BUTORPHANOL TARTRATE 1 MG/ML IJ SOLN: 1.0000 mg | INTRAMUSCULAR | Status: DC | PRN

## 2020-04-18 MED ORDER — OXYTOCIN BOLUS FROM INFUSION: 333.0000 mL | Freq: Once | INTRAVENOUS | Status: DC

## 2020-04-18 MED ORDER — ONDANSETRON HCL 4 MG/2ML IJ SOLN
4.0000 mg | Freq: Four times a day (QID) | INTRAMUSCULAR | Status: DC | PRN
  Administered 2020-04-18: 4 mg via INTRAVENOUS
  Filled 2020-04-18: qty 2

## 2020-04-18 MED ORDER — LACTATED RINGERS IV BOLUS
1000.0000 mL | Freq: Once | INTRAVENOUS | Status: AC
  Administered 2020-04-18: 1000 mL via INTRAVENOUS

## 2020-04-18 MED ORDER — OXYTOCIN-SODIUM CHLORIDE 30-0.9 UT/500ML-% IV SOLN
2.5000 [IU]/h | INTRAVENOUS | Status: DC
  Filled 2020-04-18 (×2): qty 500

## 2020-04-18 MED ORDER — BUPIVACAINE HCL (PF) 0.5 % IJ SOLN
INTRAMUSCULAR | Status: AC
  Filled 2020-04-18: qty 30

## 2020-04-18 MED ORDER — METHYLERGONOVINE MALEATE 0.2 MG/ML IJ SOLN
INTRAMUSCULAR | Status: AC
  Filled 2020-04-18: qty 1

## 2020-04-18 SURGICAL SUPPLY — 26 items
BAG COUNTER SPONGE EZ (MISCELLANEOUS) ×2 IMPLANT
CANISTER SUCT 3000ML PPV (MISCELLANEOUS) ×2 IMPLANT
CHLORAPREP W/TINT 26 (MISCELLANEOUS) ×4 IMPLANT
COVER WAND RF STERILE (DRAPES) ×2 IMPLANT
DRSG TELFA 3X8 NADH (GAUZE/BANDAGES/DRESSINGS) ×2 IMPLANT
ELECT REM PT RETURN 9FT ADLT (ELECTROSURGICAL) ×2
ELECTRODE REM PT RTRN 9FT ADLT (ELECTROSURGICAL) ×1 IMPLANT
EXTRT SYSTEM ALEXIS 17CM (MISCELLANEOUS)
GAUZE SPONGE 4X4 12PLY STRL (GAUZE/BANDAGES/DRESSINGS) ×2 IMPLANT
GLOVE SURG ENC MOIS LTX SZ6.5 (GLOVE) ×2 IMPLANT
GLOVE SURG UNDER LTX SZ7 (GLOVE) ×2 IMPLANT
GOWN STRL REUS W/ TWL LRG LVL3 (GOWN DISPOSABLE) ×2 IMPLANT
GOWN STRL REUS W/TWL LRG LVL3 (GOWN DISPOSABLE) ×2
KIT TURNOVER KIT A (KITS) ×2 IMPLANT
MANIFOLD NEPTUNE II (INSTRUMENTS) ×2 IMPLANT
NS IRRIG 1000ML POUR BTL (IV SOLUTION) ×2 IMPLANT
PACK C SECTION AR (MISCELLANEOUS) ×2 IMPLANT
PAD OB MATERNITY 4.3X12.25 (PERSONAL CARE ITEMS) ×2 IMPLANT
PAD PREP 24X41 OB/GYN DISP (PERSONAL CARE ITEMS) ×2 IMPLANT
PENCIL SMOKE EVACUATOR (MISCELLANEOUS) ×2 IMPLANT
SUT MNCRL AB 4-0 PS2 18 (SUTURE) ×2 IMPLANT
SUT PLAIN 2 0 XLH (SUTURE) IMPLANT
SUT VIC AB 0 CT1 36 (SUTURE) ×8 IMPLANT
SUT VIC AB 3-0 SH 27 (SUTURE) ×1
SUT VIC AB 3-0 SH 27X BRD (SUTURE) ×1 IMPLANT
SYSTEM CONTND EXTRCTN KII BLLN (MISCELLANEOUS) IMPLANT

## 2020-04-18 NOTE — Progress Notes (Signed)
Intrapartum Progress Note  S: Patient comfortable, now with epidural.   O: Blood pressure 121/65, pulse 72, temperature 98.5 F (36.9 C), temperature source Oral, resp. rate 16, height 5\' 3"  (1.6 m), weight 76 kg, last menstrual period 08/05/2019, SpO2 97 %. Gen App: NAD, comfortable Abdomen: soft, gravid FHT Twin A: baseline 125 bpm.  Accels present.  Decels absent. moderate in degree variability.  FHT Twin B: baseline 160 bpm.  Accels present.  Decels present x 1 (occurred with maternal vomiting). moderate in degree variability.   Tocometer: contractions q 2-3 minutes Cervix: 4/70/-2/AROM'd at last check Extremities: Nontender, no edema.  Pitocin: 10 mIU  Labs:  Results for orders placed or performed during the hospital encounter of 04/18/20  RPR  Result Value Ref Range   RPR Ser Ql NON REACTIVE NON REACTIVE  Rapid HIV screen [facility] L&D dept ONLY)  Result Value Ref Range   HIV-1 P24 Antigen - HIV24 NON REACTIVE NON REACTIVE   HIV 1/2 Antibodies NON REACTIVE NON REACTIVE   Interpretation (HIV Ag Ab)      A non reactive test result means that HIV 1 or HIV 2 antibodies and HIV 1 p24 antigen were not detected in the specimen.     Assessment:  1: Twin di-di IUP at [redacted]w[redacted]d 2. Growth restriction of Twin B (1st percentile)  Plan:  1. Continue IOL for fetal growth restriction with Pitocin. S/p epidural placement.  2. Category I tracing x 2 with exception of fetal deceleration of Twin B with maternal vomiting.  3. Anticipate vaginal delivery. Delivery will be performed in OR per protocol.   Rubie Maid, MD 04/18/2020 5:46 PM

## 2020-04-18 NOTE — H&P (Signed)
Obstetric History and Physical  Amber Wilson is a 25 y.o. G1P0000 with IUP at [redacted]w[redacted]d presenting for scheduled IOL for di-di twin pregnancy with fetal growth restriction of Twin B. She received a course of antenatal steroids on 2/10 and 2/11. Patient states she has been having  irregular, every 1-3 minutes mild contractions, none vaginal bleeding, intact membranes, with active fetal movement.    Prenatal Course Source of Care: [facility] with onset of care at 10 weeks Pregnancy complications or risks: Patient Active Problem List   Diagnosis Date Noted  . IUGR (intrauterine growth restriction) affecting care of mother, third trimester, fetus 2 04/18/2020  . Indication for care/intervention related to labor/delivery, antepartum 04/13/2020  . Indication for care in labor and delivery, antepartum 04/12/2020  . Decreased fetal movement affecting management of pregnancy in third trimester, fetus 1 of multiple gestation 03/07/2020  . Personal history of COVID-19    She plans to breastfeed She desires undecided method (but considering OCPs) for postpartum contraception.   Prenatal labs and studies: ABO, Rh: --/--/O POS (02/16 0536) Antibody: NEG (02/16 0536) Rubella: 8.17 (08/16 1425) RPR: Non Reactive (12/15 0916)  HBsAg: Negative (08/16 1425)  HIV:    CWC:BJSEGBTD/-- (02/09 1203) 1 hr Glucola  normal Genetic screening normal Anatomy US normal   Past Medical History:  Diagnosis Date  . Allergy   . Anxiety   . Depression   . GERD (gastroesophageal reflux disease)     Past Surgical History:  Procedure Laterality Date  . TONSILLECTOMY    . WISDOM TOOTH EXTRACTION      OB History  Gravida Para Term Preterm AB Living  1 0 0 0 0 0  SAB IAB Ectopic Multiple Live Births  0 0 0 0 0    # Outcome Date GA Lbr Len/2nd Weight Sex Delivery Anes PTL Lv  1 Current             Social History   Socioeconomic History  . Marital status: Single    Spouse name: Not on file   . Number of children: Not on file  . Years of education: Not on file  . Highest education level: Not on file  Occupational History  . Not on file  Tobacco Use  . Smoking status: Former Smoker    Quit date: 02/15/2016    Years since quitting: 4.1  . Smokeless tobacco: Never Used  Vaping Use  . Vaping Use: Never used  Substance and Sexual Activity  . Alcohol use: Never  . Drug use: Not Currently    Frequency: 21.0 times per week    Types: Marijuana    Comment: 3-4 times per day  . Sexual activity: Yes  Other Topics Concern  . Not on file  Social History Narrative  . Not on file   Social Determinants of Health   Financial Resource Strain: Not on file  Food Insecurity: Not on file  Transportation Needs: Not on file  Physical Activity: Not on file  Stress: Not on file  Social Connections: Not on file    Family History  Problem Relation Age of Onset  . Healthy Mother   . Healthy Father   . Healthy Maternal Grandmother   . Healthy Maternal Grandfather   . Healthy Paternal Grandmother   . Healthy Paternal Grandfather     Facility-Administered Medications Prior to Admission  Medication Dose Route Frequency Provider Last Rate Last Admin  . betamethasone acetate-betamethasone sodium phosphate (CELESTONE) injection 12 mg  12  mg Intramuscular Once Jaynie Collins, MD       Medications Prior to Admission  Medication Sig Dispense Refill Last Dose  . busPIRone (BUSPAR) 15 MG tablet Take 1 tablet (15 mg total) by mouth 2 (two) times daily. 60 tablet 11   . escitalopram (LEXAPRO) 10 MG tablet Take 1 tablet (10 mg total) by mouth daily. 30 tablet 11   . ferrous sulfate (FERROUSUL) 325 (65 FE) MG tablet Take 1 tablet (325 mg total) by mouth daily with breakfast. 60 tablet 1   . meclizine (ANTIVERT) 25 MG tablet Take 1 tablet (25 mg total) by mouth 3 (three) times daily as needed for dizziness. 30 tablet 0   . ondansetron (ZOFRAN) 4 MG tablet Take 1 tablet (4 mg total) by  mouth every 8 (eight) hours as needed for nausea or vomiting. 20 tablet 0   . Prenatal Vit-Fe Fumarate-FA (MULTIVITAMIN-PRENATAL) 27-0.8 MG TABS tablet Take 1 tablet by mouth daily at 12 noon.       Allergies  Allergen Reactions  . Penicillins   . Pollen Extract     Review of Systems: Negative except for what is mentioned in HPI.  Physical Exam: BP 130/87   Pulse 66   Temp 98.6 F (37 C) (Oral)   Resp 16   Ht 5\' 3"  (1.6 m)   Wt 76 kg   LMP 08/05/2019   BMI 29.69 kg/m  CONSTITUTIONAL: Well-developed, well-nourished female in no acute distress.  HENT:  Normocephalic, atraumatic, External right and left ear normal. Oropharynx is clear and moist EYES: Conjunctivae and EOM are normal. Pupils are equal, round, and reactive to light. No scleral icterus.  NECK: Normal range of motion, supple, no masses SKIN: Skin is warm and dry. No rash noted. Not diaphoretic. No erythema. No pallor. NEUROLOGIC: Alert and oriented to person, place, and time. Normal reflexes, muscle tone coordination. No cranial nerve deficit noted. PSYCHIATRIC: Normal mood and affect. Normal behavior. Normal judgment and thought content. CARDIOVASCULAR: Normal heart rate noted, regular rhythm RESPIRATORY: Effort and breath sounds normal, no problems with respiration noted ABDOMEN: Soft, nontender, nondistended, gravid. MUSCULOSKELETAL: Normal range of motion. No edema and no tenderness. 2+ distal pulses.  Cervical Exam: Dilatation 2 cm   Effacement 50%   Station -2   Presentation: cephalic FHT Twin A:  Baseline rate 135 bpm   Variability moderate  Accelerations present   Decelerations none FHT Twin B:  Baseline rate 140 bpm   Variability moderate  Accelerations present   Decelerations none Contractions: Every 1-3 mins, irregular   Pertinent Labs/Studies:   Results for orders placed or performed during the hospital encounter of 04/18/20 (from the past 24 hour(s))  CBC     Status: Abnormal   Collection Time:  04/18/20  5:36 AM  Result Value Ref Range   WBC 12.1 (H) 4.0 - 10.5 K/uL   RBC 3.92 3.87 - 5.11 MIL/uL   Hemoglobin 10.9 (L) 12.0 - 15.0 g/dL   HCT 33.0 (L) 36.0 - 46.0 %   MCV 84.2 80.0 - 100.0 fL   MCH 27.8 26.0 - 34.0 pg   MCHC 33.0 30.0 - 36.0 g/dL   RDW 13.9 11.5 - 15.5 %   Platelets 228 150 - 400 K/uL   nRBC 0.0 0.0 - 0.2 %  Type and screen     Status: None   Collection Time: 04/18/20  5:36 AM  Result Value Ref Range   ABO/RH(D) O POS    Antibody Screen NEG  Sample Expiration      04/21/2020,2359 Performed at [facility], [address] [place], [place] [address]     Imaging:  [place] MFM UA DOPPLER ADDL GEST RE EVAL ----------------------------------------------------------------------  OBSTETRICS REPORT                       (Signed Final 04/12/2020 03:40 pm) ---------------------------------------------------------------------- Patient Info  ID #:       [id number]                          D.O.B.:  1995/07/23 (24 yrs)  Name:       Amber Wilson               Visit Date: 04/12/2020 01:59 pm ---------------------------------------------------------------------- Performed By  Attending:        Sander Nephew      Ref. Address:     [facility]                    MD                                                             [facility]                                                             [place]  Performed By:     Ilda Basset         [facility]                                     [facility] at                                                             [facility]  Referred By:      Harlin Heys MD ---------------------------------------------------------------------- Orders  #  Description                           Code        Ordered By  1  [place] MFM OB DETAIL +14 [place]               76811.01    [place]  2  [place] MFM OB DETAIL ADDL GEST            76811.02    [place]     +14 [place]  3  [place] MFM UA CORD DOPPLER                76820.02    [place]  4  [place] MFM UA DOPPLER ADDL                35361.44    DAVID EVANS     GEST RE EVAL ----------------------------------------------------------------------  #  Order #                     Accession #                Episode #  1  [id number]                   [id number]                 [id number]  2  [id number]                   [id number]                 [id number]  3  [id number]                   [id number]                 [id number]  4  [id number]                   [id number]                 [id number] ---------------------------------------------------------------------- Indications  [redacted] weeks gestation of pregnancy                Z3A.35  Twin pregnancy, di/di, third trimester         O30.043  Maternal care for known or suspected poor      O36.5930  fetal growth, third trimester, not applicable or  unspecified IUGR ---------------------------------------------------------------------- Fetal Evaluation (Fetus A)  Num Of Fetuses:         2  Fetal Heart Rate(bpm):  152  Cardiac Activity:  Observed  Presentation:           Cephalic                              Largest Pocket(cm)                              4. ---------------------------------------------------------------------- Biophysical Evaluation (Fetus A)  Amniotic F.V:   Within normal limits       F. Tone:        Observed  F. Movement:    Observed                   Score:          8/8  F. Breathing:   Observed ---------------------------------------------------------------------- Biometry (Fetus A)  BPD:      86.3  mm     G. Age:  34w 6d         28  %    CI:        78.68   %     70 - 86                                                          FL/HC:      21.8   %    20.1 - 22.1  HC:      307.7  mm     G. Age:  34w 2d        2.6  %    HC/AC:      1.01        0.93 - 1.11  AC:      304.7  mm     G. Age:  34w 3d         20  %    FL/BPD:     77.8   %    71 - 87  FL:       67.1  mm     G. Age:  34w 4d         14  %    FL/AC:      22.0   %    20 - 24  HUM:      59.7  mm     G. Age:  34w 5d         41  %  Est. FW:    2439  gm      5 lb 6 oz     17  %     FW Discordancy     0 \ 19 % ---------------------------------------------------------------------- OB History  Gravidity:    1         Term:   0        Prem:   0        SAB:   0  TOP:          0       Ectopic:  0        Living: 0 ---------------------------------------------------------------------- Gestational Age (Fetus A)  U/S Today:     34w 4d  EDD:   05/20/20  Best:          35w 6d     Det. ByLoman Chroman         EDD:   05/11/20                                      (10/04/19) ---------------------------------------------------------------------- Anatomy (Fetus A)  Cranium:               Appears normal         LVOT:                   Not well visualized  Cavum:                 Appears normal         Aortic Arch:            Not well visualized  Ventricles:            Not well visualized    Ductal Arch:            Not well visualized  Choroid Plexus:        Not well visualized    Diaphragm:              Appears normal  Cerebellum:            Not well visualized    Stomach:                Appears normal, left                                                                        sided  Posterior Fossa:       Not well visualized    Abdomen:                Not well visualized  Nuchal Fold:           Not applicable (>44    Abdominal Wall:         Not well visualized                         wks GA)  Face:                  Not well visualized    Cord Vessels:           Not well  visualized  Lips:                  Not well visualized    Kidneys:                Appear normal  Palate:                Not well visualized    Bladder:                Appears normal  Thoracic:              Appears normal         Spine:  Not well visualized  Heart:                 Appears normal         Upper Extremities:      Not well visualized                         (4CH, axis, and                         situs)  RVOT:                  Not well visualized    Lower Extremities:      Not well visualized ---------------------------------------------------------------------- Doppler - Fetal Vessels (Fetus A)  Umbilical Artery   S/D     %tile      RI    %tile      PI    %tile            ADFV    RDFV   2.72       69    0.63       74    0.93       71               No      No ---------------------------------------------------------------------- Fetal Evaluation (Fetus B)  Num Of Fetuses:         2  Fetal Heart Rate(bpm):  167  Cardiac Activity:       Observed  Presentation:           Breech  Placenta:               Anterior                              Largest Pocket(cm)                              3.1 ---------------------------------------------------------------------- Biophysical Evaluation (Fetus B)  Amniotic F.V:   Within normal limits       F. Tone:        Observed  F. Movement:    Observed                   Score:          8/8  F. Breathing:   Observed ---------------------------------------------------------------------- Biometry (Fetus B)  BPD:      76.8  mm     G. Age:  30w 6d        < 1  %    CI:        75.35   %    70 - 86                                                          FL/HC:      22.3   %    20.1 - 22.1  HC:      280.6  mm     G. Age:  30w 5d        < 1  %    HC/AC:      0.96  0.93 - 1.11  AC:      291.4  mm     G. Age:  33w 1d        3.3  %    FL/BPD:     81.4   %    71 - 87  FL:       62.5  mm     G. Age:  32w 3d        < 1  %    FL/AC:       21.4   %    20 - 24  HUM:      53.3  mm     G. Age:  31w 0d        < 5  %  Est. FW:    1976  gm      4 lb 6 oz    1.1  %     FW Discordancy        19  % ---------------------------------------------------------------------- Gestational Age (Fetus B)  U/S Today:     31w 6d                                        EDD:   06/08/20  Best:          35w 6d     Det. ByLoman Chroman         EDD:   05/11/20                                      (10/04/19) ---------------------------------------------------------------------- Anatomy (Fetus B)  Cranium:               Appears normal         LVOT:                   Not well visualized  Cavum:                 Appears normal         Aortic Arch:            Not well visualized  Ventricles:            Not well visualized    Ductal Arch:            Not well visualized  Choroid Plexus:        Not well visualized    Diaphragm:              Appears normal  Cerebellum:            Not well visualized    Stomach:                Appears normal, left                                                                        sided  Posterior Fossa:       Not well visualized    Abdomen:  Not well visualized  Nuchal Fold:           Not applicable (>58    Abdominal Wall:         Not well visualized                         wks GA)  Face:                  Not well visualized    Cord Vessels:           Not well visualized  Lips:                  Not well visualized    Kidneys:                Appear normal  Palate:                Not well visualized    Bladder:                Appears normal  Thoracic:              Appears normal         Spine:                  Not well visualized  Heart:                 Appears normal         Upper Extremities:      Not well visualized                         (4CH, axis, and                         situs)  RVOT:                  Not well visualized    Lower Extremities:      Not well  visualized ---------------------------------------------------------------------- Doppler - Fetal Vessels (Fetus B)  Umbilical Artery   S/D     %tile      RI    %tile      PI    %tile            ADFV    RDFV   3.45       94    0.71       95    1.11       92               No      No ---------------------------------------------------------------------- Impression  Diamniotic Dichorionic twin pregnancy.  Normal anatomy with good amniotic fluid and fetal movement  was observed in Twin A and B.  Suboptimal views of the fetal anatomy was seen secondary to  advanced gestational age.  A consult was performed see EPIC  Today we observed a normal twin growth of Twin A at the  17th% with normal UA Dopplers, Twin B's EFW was the 1st%  with high normal UA Dopplers. Biophysical profile was 8/8 for  twin A and B.  Twin discordance is at 19% today as well.  Impression/Counseling:  I reviewed with Ms. Halliwell today's examination. I  discussed the diagnosis of fetal growth restriction with Twin  B. I reviewed the criteria for diagnosis with an EFW of <  10th% or AC <  10th%. She does not have risk factor such as  chronic hypertension, lupus, or prior history of preeclampsia.  Her only risk factor is a twin gestation.  I explained the complications related to FGR given the EFW  <3% results in poor outcomes including increased NRFHT,  cesarean delivery and stillbirth.  Given this risk and current gestational age at 44 w 6 d I  recommend delivery between 13-37 weeks.  We ordered a BMZ to be given today and a repeat tomorrow.  The twins have a vertex/breech presentation. We discussed  the risk and benefits of a breech twin extraction. We  discussed that it is provider preference and experience level.  We also explained that often twins with FGR have an  increased risk for not tolerating labor and may require a  cesarean delivery.  Ms. Welte and her mother were able to ask all  necessary  questions. All questions answered.  I spent 45 minutes with > 50% in face to face consultation. ---------------------------------------------------------------------- Recommendations  Betamethasone given today  Delivery between 37-39 weeks.  Provider comfort level regarding mode of delivery given IUGR  and vtx/breech presentation. ----------------------------------------------------------------------               Sander Nephew, MD Electronically Signed Final Report   04/12/2020 03:40 pm ---------------------------------------------------------------------- [place] MFM UA CORD DOPPLER ----------------------------------------------------------------------  OBSTETRICS REPORT                       (Signed Final 04/12/2020 03:40 pm) ---------------------------------------------------------------------- Patient Info  ID #:       [id number]                          D.O.B.:  [birth year] (24 yrs)  Name:       Amber Wilson               Visit Date: 04/12/2020 01:59 pm ---------------------------------------------------------------------- Performed By  Attending:        Sander Nephew      Ref. Address:     [facility]                    MD                                                             [facility]                                                             [place]  [place]  Performed By:     Ilda Basset         Location[facility]         [facility]                                     [facility] at                                                             [facility]  Referred By:      Harlin Heys  MD ---------------------------------------------------------------------- Orders  #  Description                           Code        Ordered By  1  [place] MFM OB DETAIL +14 [place]               76811.01    DAVID EVANS  2  [place] MFM OB DETAIL ADDL GEST            76811.02    DAVID EVANS     +14 [place]  3  [place] MFM UA CORD DOPPLER                76820.02    [place]  4  [place] MFM UA DOPPLER ADDL                61443.15    [place] ----------------------------------------------------------------------  #  Order #                     Accession #                Episode #  1  [id number]                   [id number]                 [id number]  2  [id number]                   [id number]                 [id number]  3  [id number]                   [id number]                 [id number]  4  [id number]                   [id number]                 [id number] ---------------------------------------------------------------------- Indications  [redacted] weeks gestation of pregnancy                Z3A.35  Twin pregnancy, di/di, third trimester         O30.043  Maternal care for known or suspected poor      O36.5930  fetal growth, third trimester, not applicable or  unspecified IUGR ---------------------------------------------------------------------- Fetal Evaluation (Fetus A)  Num Of Fetuses:         2  Fetal Heart Rate(bpm):  152  Cardiac Activity:       Observed  Presentation:           Cephalic                              Largest Pocket(cm)                              4. ---------------------------------------------------------------------- Biophysical Evaluation (Fetus A)  Amniotic F.V:   Within normal limits       F. Tone:        Observed  F. Movement:    Observed                   Score:          8/8  F. Breathing:   Observed ---------------------------------------------------------------------- Biometry (Fetus A)  BPD:      86.3  mm     G. Age:  34w 6d         28  %    CI:        78.68   %    70  - 86                                                          FL/HC:      21.8   %    20.1 - 22.1  HC:      307.7  mm     G. Age:  34w 2d        2.6  %    HC/AC:      1.01        0.93 - 1.11  AC:      304.7  mm     G. Age:  34w 3d         20  %    FL/BPD:     77.8   %    71 - 87  FL:       67.1  mm     G. Age:  34w 4d         14  %    FL/AC:      22.0   %    20 - 24  HUM:  59.7  mm     G. Age:  34w 5d         41  %  Est. FW:    2439  gm      5 lb 6 oz     17  %     FW Discordancy     0 \ 19 % ---------------------------------------------------------------------- OB History  Gravidity:    1         Term:   0        Prem:   0        SAB:   0  TOP:          0       Ectopic:  0        Living: 0 ---------------------------------------------------------------------- Gestational Age (Fetus A)  U/S Today:     34w 4d                                        EDD:   05/20/20  Best:          35w 6d     Det. ByLoman Chroman         EDD:   05/11/20                                      (10/04/19) ---------------------------------------------------------------------- Anatomy (Fetus A)  Cranium:               Appears normal         LVOT:                   Not well visualized  Cavum:                 Appears normal         Aortic Arch:            Not well visualized  Ventricles:            Not well visualized    Ductal Arch:            Not well visualized  Choroid Plexus:        Not well visualized    Diaphragm:              Appears normal  Cerebellum:            Not well visualized    Stomach:                Appears normal, left                                                                        sided  Posterior Fossa:       Not well visualized    Abdomen:                Not well visualized  Nuchal Fold:           Not applicable (>62    Abdominal Wall:  Not well visualized                         wks GA)  Face:                  Not well visualized    Cord Vessels:           Not well  visualized  Lips:                  Not well visualized    Kidneys:                Appear normal  Palate:                Not well visualized    Bladder:                Appears normal  Thoracic:              Appears normal         Spine:                  Not well visualized  Heart:                 Appears normal         Upper Extremities:      Not well visualized                         (4CH, axis, and                         situs)  RVOT:                  Not well visualized    Lower Extremities:      Not well visualized ---------------------------------------------------------------------- Doppler - Fetal Vessels (Fetus A)  Umbilical Artery   S/D     %tile      RI    %tile      PI    %tile            ADFV    RDFV   2.72       69    0.63       74    0.93       71               No      No ---------------------------------------------------------------------- Fetal Evaluation (Fetus B)  Num Of Fetuses:         2  Fetal Heart Rate(bpm):  167  Cardiac Activity:       Observed  Presentation:           Breech  Placenta:               Anterior                              Largest Pocket(cm)                              3.1 ---------------------------------------------------------------------- Biophysical Evaluation (Fetus B)  Amniotic F.V:   Within normal limits       F. Tone:        Observed  F. Movement:    Observed  Score:          8/8  F. Breathing:   Observed ---------------------------------------------------------------------- Biometry (Fetus B)  BPD:      76.8  mm     G. Age:  30w 6d        < 1  %    CI:        75.35   %    70 - 86                                                          FL/HC:      22.3   %    20.1 - 22.1  HC:      280.6  mm     G. Age:  30w 5d        < 1  %    HC/AC:      0.96        0.93 - 1.11  AC:      291.4  mm     G. Age:  33w 1d        3.3  %    FL/BPD:     81.4   %    71 - 87  FL:       62.5  mm     G. Age:  32w 3d        < 1  %    FL/AC:       21.4   %    20 - 24  HUM:      53.3  mm     G. Age:  31w 0d        < 5  %  Est. FW:    1976  gm      4 lb 6 oz    1.1  %     FW Discordancy        19  % ---------------------------------------------------------------------- Gestational Age (Fetus B)  U/S Today:     31w 6d                                        EDD:   06/08/20  Best:          35w 6d     Det. By:  Loman Chroman         EDD:   05/11/20                                      (10/04/19) ---------------------------------------------------------------------- Anatomy (Fetus B)  Cranium:               Appears normal         LVOT:                   Not well visualized  Cavum:                 Appears normal         Aortic Arch:            Not well visualized  Ventricles:  Not well visualized    Ductal Arch:            Not well visualized  Choroid Plexus:        Not well visualized    Diaphragm:              Appears normal  Cerebellum:            Not well visualized    Stomach:                Appears normal, left                                                                        sided  Posterior Fossa:       Not well visualized    Abdomen:                Not well visualized  Nuchal Fold:           Not applicable (>40    Abdominal Wall:         Not well visualized                         wks GA)  Face:                  Not well visualized    Cord Vessels:           Not well visualized  Lips:                  Not well visualized    Kidneys:                Appear normal  Palate:                Not well visualized    Bladder:                Appears normal  Thoracic:              Appears normal         Spine:                  Not well visualized  Heart:                 Appears normal         Upper Extremities:      Not well visualized                         (4CH, axis, and                         situs)  RVOT:                  Not well visualized    Lower Extremities:      Not well  visualized ---------------------------------------------------------------------- Doppler - Fetal Vessels (Fetus B)  Umbilical Artery   S/D     %tile      RI    %tile      PI    %tile  ADFV    RDFV   3.45       94    0.71       95    1.11       92               No      No ---------------------------------------------------------------------- Impression  Diamniotic Dichorionic twin pregnancy.  Normal anatomy with good amniotic fluid and fetal movement  was observed in Twin A and B.  Suboptimal views of the fetal anatomy was seen secondary to  advanced gestational age.  A consult was performed see EPIC  Today we observed a normal twin growth of Twin A at the  17th% with normal UA Dopplers, Twin B's EFW was the 1st%  with high normal UA Dopplers. Biophysical profile was 8/8 for  twin A and B.  Twin discordance is at 19% today as well.  Impression/Counseling:  I reviewed with Ms. Mckinny today's examination. I  discussed the diagnosis of fetal growth restriction with Twin  B. I reviewed the criteria for diagnosis with an EFW of <  10th% or AC < 10th%. She does not have risk factor such as  chronic hypertension, lupus, or prior history of preeclampsia.  Her only risk factor is a twin gestation.  I explained the complications related to FGR given the EFW  <3% results in poor outcomes including increased NRFHT,  cesarean delivery and stillbirth.  Given this risk and current gestational age at 66 w 6 d I  recommend delivery between 44-37 weeks.  We ordered a BMZ to be given today and a repeat tomorrow.  The twins have a vertex/breech presentation. We discussed  the risk and benefits of a breech twin extraction. We  discussed that it is provider preference and experience level.  We also explained that often twins with FGR have an  increased risk for not tolerating labor and may require a  cesarean delivery.  Ms. Paige and her mother were able to ask all  necessary  questions. All questions answered.  I spent 45 minutes with > 50% in face to face consultation. ---------------------------------------------------------------------- Recommendations  Betamethasone given today  Delivery between 37-39 weeks.  Provider comfort level regarding mode of delivery given IUGR  and vtx/breech presentation. ----------------------------------------------------------------------               Sander Nephew, MD Electronically Signed Final Report   04/12/2020 03:40 pm ---------------------------------------------------------------------- [place] MFM OB DETAIL ADDL GEST +14 [place] ----------------------------------------------------------------------  OBSTETRICS REPORT                       (Signed Final 04/12/2020 03:40 pm) ---------------------------------------------------------------------- Patient Info  ID #:       [id number]                          D.O.B.:  [birth year] (24 yrs)  Name:       Amber Wilson               Visit Date: 04/12/2020 01:59 pm ---------------------------------------------------------------------- Performed By  Attending:        Sander Nephew      Ref. Address:     [facility]                                                             [place]                                                             [place]  Performed By:     Ilda Basset         [facility]         [facility]                    RD[facility]                                     [facility] at                                                             [facility]  Referred By:      Harlin Heys  MD ---------------------------------------------------------------------- Orders  #  Description                           Code        Ordered By  1  [place] MFM OB DETAIL +14 WK               76811.01    DAVID EVANS  2  [place] MFM OB DETAIL ADDL GEST            41660.63    [place]     +14 [place]  3  [place]  MFM UA CORD DOPPLER                76820.02    DAVID EVANS  4  [place] MFM UA DOPPLER ADDL                76820.03    DAVID EVANS     GEST RE EVAL ----------------------------------------------------------------------  #  Order #                     Accession #                Episode #  1  [id number]                   [id number]                 [id number]  2  [id number]                   [id number]                 [id number]  3  [id number]                   [id number]                 [id number]  4  [id number]                   [id number]                 [id number] ---------------------------------------------------------------------- Indications  [redacted] weeks gestation of pregnancy                Z3A.35  Twin pregnancy, di/di, third trimester         O30.043  Maternal care for known or suspected poor      O36.5930  fetal growth, third trimester, not applicable or  unspecified IUGR ---------------------------------------------------------------------- Fetal Evaluation (Fetus A)  Num Of Fetuses:         2  Fetal Heart Rate(bpm):  152  Cardiac Activity:       Observed  Presentation:           Cephalic                              Largest Pocket(cm)                              4. ---------------------------------------------------------------------- Biophysical Evaluation (Fetus A)  Amniotic F.V:   Within normal limits       F. Tone:        Observed  F. Movement:    Observed                   Score:          8/8  F. Breathing:   Observed ---------------------------------------------------------------------- Biometry (Fetus A)  BPD:      86.3  mm     G. Age:  34w 6d         28  %    CI:        78.68   %    70  - 86  FL/HC:      21.8   %    20.1 - 22.1  HC:      307.7  mm     G. Age:  34w 2d        2.6  %    HC/AC:      1.01        0.93 - 1.11  AC:      304.7  mm     G. Age:  34w 3d         20  %    FL/BPD:     77.8   %    71 - 87  FL:       67.1  mm     G. Age:  34w 4d         14  %    FL/AC:      22.0   %    20 - 24  HUM:      59.7  mm     G. Age:  34w 5d         41  %  Est. FW:    2439  gm      5 lb 6 oz     17  %     FW Discordancy     0 \ 19 % ---------------------------------------------------------------------- OB History  Gravidity:    1         Term:   0        Prem:   0        SAB:   0  TOP:          0       Ectopic:  0        Living: 0 ---------------------------------------------------------------------- Gestational Age (Fetus A)  U/S Today:     34w 4d                                        EDD:   05/20/20  Best:          35w 6d     Det. ByLoman Chroman         EDD:   05/11/20                                      (10/04/19) ---------------------------------------------------------------------- Anatomy (Fetus A)  Cranium:               Appears normal         LVOT:                   Not well visualized  Cavum:                 Appears normal         Aortic Arch:            Not well visualized  Ventricles:            Not well visualized    Ductal Arch:            Not well visualized  Choroid Plexus:        Not well visualized    Diaphragm:              Appears normal  Cerebellum:  Not well visualized    Stomach:                Appears normal, left                                                                        sided  Posterior Fossa:       Not well visualized    Abdomen:                Not well visualized  Nuchal Fold:           Not applicable (>02    Abdominal Wall:         Not well visualized                         wks GA)  Face:                  Not well visualized    Cord Vessels:           Not well  visualized  Lips:                  Not well visualized    Kidneys:                Appear normal  Palate:                Not well visualized    Bladder:                Appears normal  Thoracic:              Appears normal         Spine:                  Not well visualized  Heart:                 Appears normal         Upper Extremities:      Not well visualized                         (4CH, axis, and                         situs)  RVOT:                  Not well visualized    Lower Extremities:      Not well visualized ---------------------------------------------------------------------- Doppler - Fetal Vessels (Fetus A)  Umbilical Artery   S/D     %tile      RI    %tile      PI    %tile            ADFV    RDFV   2.72       69    0.63       74    0.93       71               No      No ---------------------------------------------------------------------- Fetal Evaluation (Fetus B)  Num Of  Fetuses:         2  Fetal Heart Rate(bpm):  167  Cardiac Activity:       Observed  Presentation:           Breech  Placenta:               Anterior                              Largest Pocket(cm)                              3.1 ---------------------------------------------------------------------- Biophysical Evaluation (Fetus B)  Amniotic F.V:   Within normal limits       F. Tone:        Observed  F. Movement:    Observed                   Score:          8/8  F. Breathing:   Observed ---------------------------------------------------------------------- Biometry (Fetus B)  BPD:      76.8  mm     G. Age:  30w 6d        < 1  %    CI:        75.35   %    70 - 86                                                          FL/HC:      22.3   %    20.1 - 22.1  HC:      280.6  mm     G. Age:  30w 5d        < 1  %    HC/AC:      0.96        0.93 - 1.11  AC:      291.4  mm     G. Age:  33w 1d        3.3  %    FL/BPD:     81.4   %    71 - 87  FL:       62.5  mm     G. Age:  32w 3d        < 1  %    FL/AC:       21.4   %    20 - 24  HUM:      53.3  mm     G. Age:  31w 0d        < 5  %  Est. FW:    1976  gm      4 lb 6 oz    1.1  %     FW Discordancy        19  % ---------------------------------------------------------------------- Gestational Age (Fetus B)  U/S Today:     31w 6d                                        EDD:   06/08/20  Best:  35w 6d     Det. ByLoman Chroman         EDD:   05/11/20                                      (10/04/19) ---------------------------------------------------------------------- Anatomy (Fetus B)  Cranium:               Appears normal         LVOT:                   Not well visualized  Cavum:                 Appears normal         Aortic Arch:            Not well visualized  Ventricles:            Not well visualized    Ductal Arch:            Not well visualized  Choroid Plexus:        Not well visualized    Diaphragm:              Appears normal  Cerebellum:            Not well visualized    Stomach:                Appears normal, left                                                                        sided  Posterior Fossa:       Not well visualized    Abdomen:                Not well visualized  Nuchal Fold:           Not applicable (>15    Abdominal Wall:         Not well visualized                         wks GA)  Face:                  Not well visualized    Cord Vessels:           Not well visualized  Lips:                  Not well visualized    Kidneys:                Appear normal  Palate:                Not well visualized    Bladder:                Appears normal  Thoracic:              Appears normal         Spine:                  Not well visualized  Heart:  Appears normal         Upper Extremities:      Not well visualized                         (4CH, axis, and                         situs)  RVOT:                  Not well visualized    Lower Extremities:      Not well  visualized ---------------------------------------------------------------------- Doppler - Fetal Vessels (Fetus B)  Umbilical Artery   S/D     %tile      RI    %tile      PI    %tile            ADFV    RDFV   3.45       94    0.71       95    1.11       92               No      No ---------------------------------------------------------------------- Impression  Diamniotic Dichorionic twin pregnancy.  Normal anatomy with good amniotic fluid and fetal movement  was observed in Twin A and B.  Suboptimal views of the fetal anatomy was seen secondary to  advanced gestational age.  A consult was performed see EPIC  Today we observed a normal twin growth of Twin A at the  17th% with normal UA Dopplers, Twin B's EFW was the 1st%  with high normal UA Dopplers. Biophysical profile was 8/8 for  twin A and B.  Twin discordance is at 19% today as well.  Impression/Counseling:  I reviewed with Ms. Potts today's examination. I  discussed the diagnosis of fetal growth restriction with Twin  B. I reviewed the criteria for diagnosis with an EFW of <  10th% or AC < 10th%. She does not have risk factor such as  chronic hypertension, lupus, or prior history of preeclampsia.  Her only risk factor is a twin gestation.  I explained the complications related to FGR given the EFW  <3% results in poor outcomes including increased NRFHT,  cesarean delivery and stillbirth.  Given this risk and current gestational age at 87 w 6 d I  recommend delivery between 23-37 weeks.  We ordered a BMZ to be given today and a repeat tomorrow.  The twins have a vertex/breech presentation. We discussed  the risk and benefits of a breech twin extraction. We  discussed that it is provider preference and experience level.  We also explained that often twins with FGR have an  increased risk for not tolerating labor and may require a  cesarean delivery.  Ms. Keegan and her mother were able to ask all  necessary  questions. All questions answered.  I spent 45 minutes with > 50% in face to face consultation. ---------------------------------------------------------------------- Recommendations  Betamethasone given today  Delivery between 37-39 weeks.  Provider comfort level regarding mode of delivery given IUGR  and vtx/breech presentation. ----------------------------------------------------------------------               Sander Nephew, MD Electronically Signed Final Report   04/12/2020 03:40 pm ---------------------------------------------------------------------- [place] MFM OB DETAIL +14 WK ----------------------------------------------------------------------  OBSTETRICS REPORT                       (  Signed Final 04/12/2020 03:40 pm) ---------------------------------------------------------------------- Patient Info  ID #:       [id number]                          D.O.B.:  05-21-1995 (24 yrs)  Name:       Amber Wilson               Visit Date: 04/12/2020 01:59 pm ---------------------------------------------------------------------- Performed By  Attending:        Sander Nephew      Ref. Address:     [facility]                    MD                                                             [facility]                                                             [place]  Performed By:     Ilda Basset         Location[facility]         [facility]                    RD[facility]                                     [facility] at  [facility]  Referred By:      Harlin Heys  MD ---------------------------------------------------------------------- Orders  #  Description                           Code        Ordered By  1  [place] MFM OB DETAIL +14 [place]               76811.01    [place]  2  [place] MFM OB DETAIL ADDL GEST            76811.02    DAVID EVANS     +14 [place]  3  [place] MFM UA CORD DOPPLER                76820.02    [place]  4  [place] MFM UA DOPPLER ADDL                27035.00    DAVID EVANS     GEST RE EVAL ----------------------------------------------------------------------  #  Order #                     Accession #                Episode #  1  [id number]                   [id number]                 [id number]  2  [id number]                   [id number]                 [id number]  3  [id number]                   [id number]                 [id number]  4  [id number]                   [id number]                 [id number] ---------------------------------------------------------------------- Indications  [redacted] weeks gestation of pregnancy                Z3A.35  Twin pregnancy, di/di, third trimester         O30.043  Maternal care for known or suspected poor      O36.5930  fetal growth, third trimester, not applicable or  unspecified IUGR ---------------------------------------------------------------------- Fetal Evaluation (Fetus A)  Num Of Fetuses:         2  Fetal Heart Rate(bpm):  152  Cardiac Activity:       Observed  Presentation:           Cephalic                              Largest Pocket(cm)                              4. ---------------------------------------------------------------------- Biophysical Evaluation (Fetus A)  Amniotic F.V:   Within normal limits       F. Tone:  Observed  F. Movement:    Observed                   Score:          8/8  F. Breathing:   Observed ---------------------------------------------------------------------- Biometry (Fetus A)  BPD:      86.3  mm     G. Age:  34w 6d         28  %    CI:        78.68   %    70  - 86                                                          FL/HC:      21.8   %    20.1 - 22.1  HC:      307.7  mm     G. Age:  34w 2d        2.6  %    HC/AC:      1.01        0.93 - 1.11  AC:      304.7  mm     G. Age:  34w 3d         20  %    FL/BPD:     77.8   %    71 - 87  FL:       67.1  mm     G. Age:  34w 4d         14  %    FL/AC:      22.0   %    20 - 24  HUM:      59.7  mm     G. Age:  34w 5d         41  %  Est. FW:    2439  gm      5 lb 6 oz     17  %     FW Discordancy     0 \ 19 % ---------------------------------------------------------------------- OB History  Gravidity:    1         Term:   0        Prem:   0        SAB:   0  TOP:          0       Ectopic:  0        Living: 0 ---------------------------------------------------------------------- Gestational Age (Fetus A)  U/S Today:     34w 4d                                        EDD:   05/20/20  Best:          35w 6d     Det. ByLoman Chroman         EDD:   05/11/20                                      (10/04/19) ---------------------------------------------------------------------- Anatomy (Fetus A)  Cranium:  Appears normal         LVOT:                   Not well visualized  Cavum:                 Appears normal         Aortic Arch:            Not well visualized  Ventricles:            Not well visualized    Ductal Arch:            Not well visualized  Choroid Plexus:        Not well visualized    Diaphragm:              Appears normal  Cerebellum:            Not well visualized    Stomach:                Appears normal, left                                                                        sided  Posterior Fossa:       Not well visualized    Abdomen:                Not well visualized  Nuchal Fold:           Not applicable (>53    Abdominal Wall:         Not well visualized                         wks GA)  Face:                  Not well visualized    Cord Vessels:           Not well  visualized  Lips:                  Not well visualized    Kidneys:                Appear normal  Palate:                Not well visualized    Bladder:                Appears normal  Thoracic:              Appears normal         Spine:                  Not well visualized  Heart:                 Appears normal         Upper Extremities:      Not well visualized                         (4CH, axis, and  situs)  RVOT:                  Not well visualized    Lower Extremities:      Not well visualized ---------------------------------------------------------------------- Doppler - Fetal Vessels (Fetus A)  Umbilical Artery   S/D     %tile      RI    %tile      PI    %tile            ADFV    RDFV   2.72       69    0.63       74    0.93       71               No      No ---------------------------------------------------------------------- Fetal Evaluation (Fetus B)  Num Of Fetuses:         2  Fetal Heart Rate(bpm):  167  Cardiac Activity:       Observed  Presentation:           Breech  Placenta:               Anterior                              Largest Pocket(cm)                              3.1 ---------------------------------------------------------------------- Biophysical Evaluation (Fetus B)  Amniotic F.V:   Within normal limits       F. Tone:        Observed  F. Movement:    Observed                   Score:          8/8  F. Breathing:   Observed ---------------------------------------------------------------------- Biometry (Fetus B)  BPD:      76.8  mm     G. Age:  30w 6d        < 1  %    CI:        75.35   %    70 - 86                                                          FL/HC:      22.3   %    20.1 - 22.1  HC:      280.6  mm     G. Age:  30w 5d        < 1  %    HC/AC:      0.96        0.93 - 1.11  AC:      291.4  mm     G. Age:  33w 1d        3.3  %    FL/BPD:     81.4   %    71 - 87  FL:       62.5  mm     G. Age:  32w 3d        < 1  %    FL/AC:  21.4   %    20 - 24  HUM:      53.3  mm     G. Age:  31w 0d        < 5  %  Est. FW:    1976  gm      4 lb 6 oz    1.1  %     FW Discordancy        19  % ---------------------------------------------------------------------- Gestational Age (Fetus B)  U/S Today:     31w 6d                                        EDD:   06/08/20  Best:          35w 6d     Det. ByLoman Chroman         EDD:   05/11/20                                      (10/04/19) ---------------------------------------------------------------------- Anatomy (Fetus B)  Cranium:               Appears normal         LVOT:                   Not well visualized  Cavum:                 Appears normal         Aortic Arch:            Not well visualized  Ventricles:            Not well visualized    Ductal Arch:            Not well visualized  Choroid Plexus:        Not well visualized    Diaphragm:              Appears normal  Cerebellum:            Not well visualized    Stomach:                Appears normal, left                                                                        sided  Posterior Fossa:       Not well visualized    Abdomen:                Not well visualized  Nuchal Fold:           Not applicable (>32    Abdominal Wall:         Not well visualized                         wks GA)  Face:                  Not well visualized    Cord  Vessels:           Not well visualized  Lips:                  Not well visualized    Kidneys:                Appear normal  Palate:                Not well visualized    Bladder:                Appears normal  Thoracic:              Appears normal         Spine:                  Not well visualized  Heart:                 Appears normal         Upper Extremities:      Not well visualized                         (4CH, axis, and                         situs)  RVOT:                  Not well visualized    Lower Extremities:      Not well  visualized ---------------------------------------------------------------------- Doppler - Fetal Vessels (Fetus B)  Umbilical Artery   S/D     %tile      RI    %tile      PI    %tile            ADFV    RDFV   3.45       94    0.71       95    1.11       92               No      No ---------------------------------------------------------------------- Impression  Diamniotic Dichorionic twin pregnancy.  Normal anatomy with good amniotic fluid and fetal movement  was observed in Twin A and B.  Suboptimal views of the fetal anatomy was seen secondary to  advanced gestational age.  A consult was performed see EPIC  Today we observed a normal twin growth of Twin A at the  17th% with normal UA Dopplers, Twin B's EFW was the 1st%  with high normal UA Dopplers. Biophysical profile was 8/8 for  twin A and B.  Twin discordance is at 19% today as well.  Impression/Counseling:  I reviewed with Ms. Mentzer today's examination. I  discussed the diagnosis of fetal growth restriction with Twin  B. I reviewed the criteria for diagnosis with an EFW of <  10th% or AC < 10th%. She does not have risk factor such as  chronic hypertension, lupus, or prior history of preeclampsia.  Her only risk factor is a twin gestation.  I explained the complications related to FGR given the EFW  <3% results in poor outcomes including increased NRFHT,  cesarean delivery and stillbirth.  Given this risk and current gestational age at 14 w 6 d I  recommend delivery between 64-37 weeks.  We ordered a BMZ to be given today and a repeat tomorrow.  The  twins have a vertex/breech presentation. We discussed  the risk and benefits of a breech twin extraction. We  discussed that it is provider preference and experience level.  We also explained that often twins with FGR have an  increased risk for not tolerating labor and may require a  cesarean delivery.  Ms. Passey and her mother were able to ask all  necessary  questions. All questions answered.  I spent 45 minutes with > 50% in face to face consultation. ---------------------------------------------------------------------- Recommendations  Betamethasone given today  Delivery between 37-39 weeks.  Provider comfort level regarding mode of delivery given IUGR  and vtx/breech presentation. ----------------------------------------------------------------------               Sander Nephew, MD Electronically Signed Final Report   04/12/2020 03:40 pm ----------------------------------------------------------------------   Assessment : Amber Wilson is a 25 y.o. G1P0000 at [redacted]w[redacted]d being admitted for induction of labor due to fetal growth restriction in di-di twin pregnancy. History of anxiety and depression, well managed on Lexapro and Buspar. History of COVID in pregnancy.   Plan: Labor: Induction with Cytotec as ordered as per protocol. Analgesia as needed. Bedside sono this morning confirms vertex presentation of Twin A. FWB: Reassuring fetal heart tracing.  GBS negative. Delivery plan: Hopeful for vaginal delivery   Rubie Maid, MD [facility]

## 2020-04-18 NOTE — Progress Notes (Signed)
Pt complete, urge to push. Call ti Dr Marcelline Mates, CRNA, Dr Oren Section MDA and nursery transition nurse. Pt transported to OR for double set up

## 2020-04-18 NOTE — Anesthesia Preprocedure Evaluation (Addendum)
Anesthesia Evaluation  Patient identified by MRN, date of birth, ID band Patient awake    Reviewed: Allergy & Precautions, H&P , NPO status , Patient's Chart, lab work & pertinent test results  Airway Mallampati: II  TM Distance: >3 FB Neck ROM: full    Dental no notable dental hx. (+) Teeth Intact   Pulmonary former smoker,    Pulmonary exam normal        Cardiovascular negative cardio ROS Normal cardiovascular exam     Neuro/Psych PSYCHIATRIC DISORDERS Anxiety Depression negative neurological ROS     GI/Hepatic GERD  Medicated and Poorly Controlled,  Endo/Other  negative endocrine ROS  Renal/GU negative Renal ROS  negative genitourinary   Musculoskeletal   Abdominal   Peds  Hematology negative hematology ROS (+)   Anesthesia Other Findings   Reproductive/Obstetrics (+) Pregnancy                             Anesthesia Physical Anesthesia Plan  ASA: II  Anesthesia Plan: Epidural   Post-op Pain Management:    Induction:   PONV Risk Score and Plan:   Airway Management Planned:   Additional Equipment:   Intra-op Plan:   Post-operative Plan:   Informed Consent: I have reviewed the patients History and Physical, chart, labs and discussed the procedure including the risks, benefits and alternatives for the proposed anesthesia with the patient or authorized representative who has indicated his/her understanding and acceptance.       Plan Discussed with: Anesthesiologist and CRNA  Anesthesia Plan Comments: (2000:  Will monitor patient for double setup with epidural still running.  Patient was apprised of plan , but that if C-S was imminent, that we would dose up the epidural for the procedure with possible GOT with rapid sequence if epidural does not setup.)       Anesthesia Quick Evaluation

## 2020-04-18 NOTE — Anesthesia Procedure Notes (Signed)
Epidural Patient location during procedure: OB Start time: 04/18/2020 3:24 PM End time: 04/18/2020 3:35 PM  Staffing Anesthesiologist: Molli Barrows, MD Resident/CRNA: Hedda Slade, CRNA Performed: resident/CRNA   Preanesthetic Checklist Completed: patient identified, IV checked, site marked, risks and benefits discussed, surgical consent, monitors and equipment checked, pre-op evaluation and timeout performed  Epidural Patient position: sitting Prep: ChloraPrep Patient monitoring: heart rate, continuous pulse ox and blood pressure Approach: midline Location: L3-L4 Injection technique: LOR saline  Needle:  Needle type: Tuohy  Needle gauge: 17 G Needle length: 9 cm and 9 Needle insertion depth: 6 cm Catheter type: closed end flexible Catheter size: 19 Gauge Catheter at skin depth: 11 cm Test dose: negative and 1.5% lidocaine with Epi 1:200 K  Assessment Events: blood not aspirated, injection not painful, no injection resistance, no paresthesia and negative IV test  Additional Notes 1 attempt Pt. Evaluated and documentation done after procedure finished. Patient identified. Risks/Benefits/Options discussed with patient including but not limited to bleeding, infection, nerve damage, paralysis, failed block, incomplete pain control, headache, blood pressure changes, nausea, vomiting, reactions to medication both or allergic, itching and postpartum back pain. Confirmed with bedside nurse the patient's most recent platelet count. Confirmed with patient that they are not currently taking any anticoagulation, have any bleeding history or any family history of bleeding disorders. Patient expressed understanding and wished to proceed. All questions were answered. Sterile technique was used throughout the entire procedure. Please see nursing notes for vital signs. Test dose was given through epidural catheter and negative prior to continuing to dose epidural or start infusion. Warning signs  of high block given to the patient including shortness of breath, tingling/numbness in hands, complete motor block, or any concerning symptoms with instructions to call for help. Patient was given instructions on fall risk and not to get out of bed. All questions and concerns addressed with instructions to call with any issues or inadequate analgesia.   Patient tolerated the insertion well without immediate complications.Reason for block:procedure for pain

## 2020-04-18 NOTE — Progress Notes (Signed)
Pt moved to OR for delivery

## 2020-04-18 NOTE — Progress Notes (Signed)
Intrapartum Progress Note  S: Patient notes some cramping in her back  O: Blood pressure (!) 106/52, pulse 93, temperature 98.3 F (36.8 C), temperature source Oral, resp. rate 16, height 5\' 3"  (1.6 m), weight 76 kg, last menstrual period 08/05/2019. Gen App: NAD, comfortable Abdomen: soft, gravid FHT Twin A: baseline 125 bpm.  Accels present.  Decels absent. moderate in degree variability.   FHT Twin B: baseline 155 bpm.  Accels present.  Decels absent. moderate in degree variability.   Tocometer: contractions q 1-2 minutes Cervix: 3/50/-2 Extremities: Nontender, no edema.  Pitocin: None  Labs:  Results for orders placed or performed during the hospital encounter of 04/18/20  RPR  Result Value Ref Range   RPR Ser Ql NON REACTIVE NON REACTIVE  Rapid HIV screen [facility] L&D dept ONLY)  Result Value Ref Range   HIV-1 P24 Antigen - HIV24 NON REACTIVE NON REACTIVE   HIV 1/2 Antibodies NON REACTIVE NON REACTIVE   Interpretation (HIV Ag Ab)      A non reactive test result means that HIV 1 or HIV 2 antibodies and HIV 1 p24 antigen were not detected in the specimen.     Assessment:  1: Twin di-di IUP at [redacted]w[redacted]d 2. Growth restriction of Twin B (1st percentile)  Plan:  1. Continue IOL for fetal growth restriction.  AROM'd with clear fluid. Analgesia as needed. Patient ultimately desires an epidural. Can use Pitocin for augmentation as needed.  2. Category I tracing x 2.  3. Anticipate vaginal delivery. Delivery will be performed in OR per protocol.   Rubie Maid, MD 04/18/2020 12:43 PM

## 2020-04-19 ENCOUNTER — Encounter: Payer: Self-pay | Admitting: Obstetrics and Gynecology

## 2020-04-19 DIAGNOSIS — O99340 Other mental disorders complicating pregnancy, unspecified trimester: Secondary | ICD-10-CM | POA: Diagnosis present

## 2020-04-19 DIAGNOSIS — Z8659 Personal history of other mental and behavioral disorders: Secondary | ICD-10-CM

## 2020-04-19 DIAGNOSIS — F419 Anxiety disorder, unspecified: Secondary | ICD-10-CM | POA: Diagnosis present

## 2020-04-19 DIAGNOSIS — O30043 Twin pregnancy, dichorionic/diamniotic, third trimester: Secondary | ICD-10-CM | POA: Diagnosis present

## 2020-04-19 LAB — CBC
HCT: 24.6 % — ABNORMAL LOW (ref 36.0–46.0)
Hemoglobin: 8.4 g/dL — ABNORMAL LOW (ref 12.0–15.0)
MCH: 28.4 pg (ref 26.0–34.0)
MCHC: 34.1 g/dL (ref 30.0–36.0)
MCV: 83.1 fL (ref 80.0–100.0)
Platelets: 176 K/uL (ref 150–400)
RBC: 2.96 MIL/uL — ABNORMAL LOW (ref 3.87–5.11)
RDW: 14.1 % (ref 11.5–15.5)
WBC: 15.3 K/uL — ABNORMAL HIGH (ref 4.0–10.5)
nRBC: 0 % (ref 0.0–0.2)

## 2020-04-19 MED ORDER — ONDANSETRON HCL 4 MG PO TABS: 4.0000 mg | ORAL_TABLET | ORAL | Status: DC | PRN

## 2020-04-19 MED ORDER — BUSPIRONE HCL 15 MG PO TABS
15.0000 mg | ORAL_TABLET | Freq: Two times a day (BID) | ORAL | Status: DC
  Administered 2020-04-19 – 2020-04-20 (×4): 15 mg via ORAL
  Filled 2020-04-19 (×5): qty 1

## 2020-04-19 MED ORDER — ESCITALOPRAM OXALATE 10 MG PO TABS
10.0000 mg | ORAL_TABLET | Freq: Every day | ORAL | Status: DC
  Administered 2020-04-19: 10 mg via ORAL
  Filled 2020-04-19: qty 1

## 2020-04-19 MED ORDER — WITCH HAZEL-GLYCERIN EX PADS: 1.0000 "application " | MEDICATED_PAD | CUTANEOUS | Status: DC | PRN

## 2020-04-19 MED ORDER — FERROUS SULFATE 325 (65 FE) MG PO TABS
325.0000 mg | ORAL_TABLET | Freq: Two times a day (BID) | ORAL | Status: DC
  Administered 2020-04-19 – 2020-04-20 (×3): 325 mg via ORAL
  Filled 2020-04-19 (×3): qty 1

## 2020-04-19 MED ORDER — COCONUT OIL OIL: 1.0000 "application " | TOPICAL_OIL | Status: DC | PRN

## 2020-04-19 MED ORDER — ONDANSETRON HCL 4 MG/2ML IJ SOLN: 4.0000 mg | INTRAMUSCULAR | Status: DC | PRN

## 2020-04-19 MED ORDER — SENNOSIDES-DOCUSATE SODIUM 8.6-50 MG PO TABS
2.0000 | ORAL_TABLET | Freq: Every day | ORAL | Status: DC
  Administered 2020-04-19 – 2020-04-20 (×2): 2 via ORAL
  Filled 2020-04-19 (×2): qty 2

## 2020-04-19 MED ORDER — BENZOCAINE-MENTHOL 20-0.5 % EX AERO: 1.0000 "application " | INHALATION_SPRAY | CUTANEOUS | Status: DC | PRN

## 2020-04-19 MED ORDER — DIPHENHYDRAMINE HCL 25 MG PO CAPS: 25.0000 mg | ORAL_CAPSULE | Freq: Four times a day (QID) | ORAL | Status: DC | PRN

## 2020-04-19 MED ORDER — ACETAMINOPHEN 325 MG PO TABS: 650.0000 mg | ORAL_TABLET | ORAL | Status: DC | PRN

## 2020-04-19 MED ORDER — IBUPROFEN 600 MG PO TABS
600.0000 mg | ORAL_TABLET | Freq: Four times a day (QID) | ORAL | Status: DC
  Administered 2020-04-19 – 2020-04-20 (×4): 600 mg via ORAL
  Filled 2020-04-19 (×4): qty 1

## 2020-04-19 MED ORDER — ZOLPIDEM TARTRATE 5 MG PO TABS: 5.0000 mg | ORAL_TABLET | Freq: Every evening | ORAL | Status: DC | PRN

## 2020-04-19 MED ORDER — DIBUCAINE (PERIANAL) 1 % EX OINT: 1.0000 "application " | TOPICAL_OINTMENT | CUTANEOUS | Status: DC | PRN

## 2020-04-19 MED ORDER — IBUPROFEN 600 MG PO TABS
600.0000 mg | ORAL_TABLET | Freq: Four times a day (QID) | ORAL | Status: DC
  Administered 2020-04-19: 600 mg via ORAL
  Filled 2020-04-19: qty 1

## 2020-04-19 MED ORDER — SIMETHICONE 80 MG PO CHEW: 80.0000 mg | CHEWABLE_TABLET | ORAL | Status: DC | PRN

## 2020-04-19 MED ORDER — PRENATAL MULTIVITAMIN CH
1.0000 | ORAL_TABLET | Freq: Every day | ORAL | Status: DC
  Administered 2020-04-19 – 2020-04-20 (×2): 1 via ORAL
  Filled 2020-04-19 (×2): qty 1

## 2020-04-19 NOTE — TOC Initial Note (Signed)
Transition of Care [facility]) - Initial/Assessment Note    Patient Details  Name: Amber Wilson MRN: [id number] Date of Birth: 02-15-1996  Transition of [facility]) CM/SW Contact:    Anselm Pancoast, RN Phone Number: 04/19/2020, 1:15 PM  Clinical Narrative:                 Spoke with patient and support sytem. Patient states she has all needed equipment and is happy and excited to be a parent. Has no issue with transportation and is active with [facility] services. Is planning to breastfeeding however currently using formula as supplement. Was planning on [facility] and found out they do not take Medicaid so will be working with staff to find new pediatrician prior to discharge. Patient reports she has history of depression and anxiety which is controlled with medications and therapy. Currently has a plan with her therapist to monitor and treat PPD if becomes an issue. Patient has no needs or concerns and reports a strong support system available for assistance.         Patient Goals and CMS Choice        Expected Discharge Plan and Services                                                Prior Living Arrangements/Services                       Activities of Daily Living Home Assistive Devices/Equipment: None ADL Screening (condition at time of admission) Patient's cognitive ability adequate to safely complete daily activities?: Yes Is the patient deaf or have difficulty hearing?: No Does the patient have difficulty seeing, even when wearing glasses/contacts?: No Does the patient have difficulty concentrating, remembering, or making decisions?: Yes Patient able to express need for assistance with ADLs?: Yes Does the patient have difficulty dressing or bathing?: No Independently performs ADLs?: Yes (appropriate for developmental age) Does the patient have difficulty walking or climbing stairs?: No Weakness of Legs: None Weakness of Arms/Hands:  None  Permission Sought/Granted                  Emotional Assessment              Admission diagnosis:  IUGR (intrauterine growth restriction) affecting care of mother, third trimester, fetus 2 [O36.5932] Patient Active Problem List   Diagnosis Date Noted  . Third-stage postpartum hemorrhage with delivery 04/19/2020  . Anxiety in pregnancy, antepartum 04/19/2020  . History of depression 04/19/2020  . Dichorionic diamniotic twin pregnancy in third trimester 04/19/2020  . IUGR (intrauterine growth restriction) affecting care of mother, third trimester, fetus 2 04/18/2020  . Personal history of COVID-19    PCP:  Trinna Post, PA-C Pharmacy:   [id number] - [place], [address] [place] [address] Phone: [phone] Fax: [phone]     Social Determinants of Health (SDOH) Interventions    Readmission Risk Interventions No flowsheet data found.

## 2020-04-19 NOTE — Anesthesia Postprocedure Evaluation (Signed)
Anesthesia Post Note  Patient: Amber Wilson  Procedure(s) Performed: [place]  Patient location during evaluation: Mother Baby Anesthesia Type: Epidural Level of consciousness: awake and alert Pain management: pain level controlled Vital Signs Assessment: post-procedure vital signs reviewed and stable Respiratory status: spontaneous breathing, nonlabored ventilation and respiratory function stable Cardiovascular status: stable Postop Assessment: no headache, no backache and epidural receding Anesthetic complications: no   No complications documented.   Last Vitals:  Vitals:   04/19/20 0308 04/19/20 0726  BP: 112/62 133/76  Pulse: 88 80  Resp: 20 20  Temp: 36.9 C 37.2 C  SpO2: 99% 99%    Last Pain:  Vitals:   04/19/20 0726  TempSrc: Oral  PainSc:                  Caryl Asp

## 2020-04-19 NOTE — Progress Notes (Signed)
Post Partum Day # 1, s/p SVD (di-di twins). Postpartum hemorrhage (1180 ml).   Subjective: no complaints, up ad lib, voiding and tolerating PO  Objective: Temp:  [98.1 F (36.7 C)-98.9 F (37.2 C)] 98.9 F (37.2 C) (02/17 0726) Pulse Rate:  [72-135] 80 (02/17 0726) Resp:  [16-20] 20 (02/17 0726) BP: (106-159)/(52-108) 133/76 (02/17 0726) SpO2:  [95 %-100 %] 99 % (02/17 0726)  Physical Exam:  General: alert and no distress  Lungs: clear to auscultation bilaterally Breasts: normal appearance, no masses or tenderness Heart: regular rate and rhythm, S1, S2 normal, no murmur, click, rub or gallop Abdomen: soft, non-tender; bowel sounds normal; no masses,  no organomegaly Pelvis: Lochia: appropriate, Uterine Fundus: firm Extremities: DVT Evaluation: No evidence of DVT seen on physical exam. Negative Homan's sign. No cords or calf tenderness. No significant calf/ankle edema.  Recent Labs    04/18/20 0536 04/19/20 0338  HGB 10.9* 8.4*  HCT 33.0* 24.6*    Assessment/Plan: Doing well postpartum Breastfeeding, Lactation consult  Circumcision prior to discharge if appropriate (based on size) Contraception OCPs.  Anemia of pregnancy, mild and asymptomatic. To treat with iron postpartum.  History of anxiety, currently on Lexapro and Buspar. Continue.  History of depression, has been asymptomatic in pregnancy, will f/u postpartum.  Dispo: likely d/c home in a.m.    LOS: 1 day   Rubie Maid, MD [facility] 04/19/2020 7:57 AM

## 2020-04-19 NOTE — Lactation Note (Signed)
This note was copied from a baby's chart. Lactation Consultation Note  Patient Name: Amber Wilson MPNTI'R Date: 04/19/2020 Reason for consult: Initial assessment;1st time breastfeeding;Late-preterm 34-36.6wks;Infant < 6lbs;Multiple gestation Age:26 hours  Initial lactation visit. First time mom who delivered twins at [redacted]w[redacted]d desires breastfeeding as main feeding choice. Currently Twin A is in room with parents, Twin B is in SCN. Twin A is being followed for glucose concerns and poor feeding with possible admission to SCN. Overnight feeding attempts at breast were successful with size 47mm NS. LC assisted with pump set-up in room, education, and first pumping session. Mom provided with size 36mm flange, but would benefit from smaller size, however hospital does not have this size available. Mom instructed on importance of pumping frequency and consistency to aid in the onset of milk production. Discussed milk supply and demand and normal course of lactation. Note: mom has smaller breasts, areolas, and nipples. She does not have a hx of significant breast changes during pregnancy, and reports no breast damage/surgery.  Possible IGT. LC to follow-up as needed.  Maternal Data Has patient been taught Hand Expression?: Yes Does the patient have breastfeeding experience prior to this delivery?: No  Feeding Mother's Current Feeding Choice: Breast Milk Nipple Type: Nfant Slow Flow (purple)  LATCH Score                    Lactation Tools Discussed/Used Tools: Pump;Nipple [place] Fuel (would benefit from smaller flange) Nipple shield size: 16 Breast pump type: Double-Electric Breast Pump Pump Education: Setup, frequency, and cleaning;Milk Storage Reason for Pumping: Twins; 36wk; SCN Pumping frequency: q 3hrs  Interventions Interventions: Breast feeding basics reviewed;Hand express;DEBP;Education  Discharge Pump: DEBP  Consult Status Consult Status: Follow-up Date:  04/19/20 Follow-up type: In-patient    Lavonia Drafts 04/19/2020, 10:31 AM

## 2020-04-20 MED ORDER — IBUPROFEN 600 MG PO TABS
600.0000 mg | ORAL_TABLET | Freq: Four times a day (QID) | ORAL | Status: DC
  Administered 2020-04-20: 600 mg via ORAL
  Filled 2020-04-20: qty 1

## 2020-04-20 MED ORDER — FERROUS SULFATE 325 (65 FE) MG PO TABS: 325.0000 mg | ORAL_TABLET | Freq: Two times a day (BID) | ORAL | 1 refills | Status: DC

## 2020-04-20 MED ORDER — IBUPROFEN 600 MG PO TABS: 600.0000 mg | ORAL_TABLET | Freq: Four times a day (QID) | ORAL | 1 refills | Status: DC

## 2020-04-20 NOTE — Progress Notes (Signed)
Pt's vs stable; pt up ad lib; pt caring for baby A in room and goes to see/hold/feed baby B in SCN; tolerating regular diet; taking only motrin for pain

## 2020-04-20 NOTE — Progress Notes (Signed)
Patient discharged home Discharge instructions, prescriptions and follow up appointment given to and reviewed with patient. Patient verbalized understanding. Patient will room in with infant. One twin in the SCN, the other twin will remain a pediatric patient.

## 2020-04-20 NOTE — Discharge Summary (Signed)
Postpartum Discharge Summary       Patient Name: Amber Wilson DOB: [birth year] MRN: [id number]  Date of admission: 04/18/2020 Delivery date:   Tanja, Gift [[id number]]  04/18/2020    Naziyah, Tieszen [[id number]]  04/18/2020   Delivering provider:    Appolonia, Ackert [[id number]]  Tamlyn, Sides [[id number]]  Rubie Maid   Date of discharge: 04/20/2020  Admitting diagnosis: IUGR (intrauterine growth restriction) affecting care of mother, third trimester, fetus 2 [O36.5932] Intrauterine pregnancy: [redacted]w[redacted]d     Secondary diagnosis:  Principal Problem:   IUGR (intrauterine growth restriction) affecting care of mother, third trimester, fetus 2 Active Problems:   Anxiety in pregnancy, antepartum   History of depression   Dichorionic diamniotic twin pregnancy in third trimester   Anemia of pregnancy  Additional problems: None   Discharge diagnosis: Preterm Pregnancy (di-di twins) Delivered, Anemia, PPH and IUGR (fetus 2)                                              Post partum procedures:None Augmentation: AROM, Pitocin and Cytotec Complications: [id number]  Hospital course: Induction of Labor With Vaginal Delivery   25 y.o. yo G1P0000 at [redacted]w[redacted]d was admitted to the hospital 04/18/2020 for induction of labor.  Indication for induction: Fetal growth restriction in di-di twin pregnancy (fetus #2).  Patient had an uncomplicated labor course as follows: Membrane Rupture Time/Date:    Hala, Narula [[id number]]  12:35 PM    Lovey, Crupi [[id number]]  9:06 PM  ,   Sherion, Dooly [[id number]]  04/18/2020    Myeesha, Shane [[id number]]  04/18/2020    Delivery Method:   Maggie Schwalbe [[id number]]  Vaginal, Spontaneous    Cayle, Cordoba [[id number]]  Vaginal, Spontaneous   Episiotomy:    Tenisha, Fleece [[id number]]  None    Faviola, Klare [[id number]]  None    Lacerations:     Marlena, Barbato [[id number]]  1st degree    Dorissa, Stinnette [[id number]]     Details of delivery can be found in separate delivery note.  Patient had a routine postpartum course. Patient is discharged home 04/20/20.  Newborn Data: Birth date:   Makayle, Krahn [[id number]]  04/18/2020    Jaslynn, Thome [[id number]]  04/18/2020   Birth time:   Ladaja, Yusupov [[id number]]  8:21 PM    Roza, Creamer [[id number]]  9:07 PM   Gender:   Litzi, Binning [[id number]]  Female    Shawneen, Deetz [[id number]]  Female   Living status:   Theodosia, Bahena [[id number]]  GGEZMO    QHUTMLYYTK, PTWS FKCL [[id number]]  Living   Apgars:   Dionisia, Pacholski [[id number]]  [address] [[id number]]  [place]  ,   Labrittany, Wechter [[id number]]  [address] [[id number]]  9   Weight:   Bemnet, Trovato [[id number]]  2395 g    Esha, Fincher [[id number]]  1900 g    Magnesium Sulfate received: No BMZ received: Yes Rhophylac:No MMR:No T-DaP:Given prenatally Flu: Yes Transfusion:No  Physical exam  Vitals:   04/19/20 0726 04/19/20 1114 04/19/20 2345 04/20/20 0749  BP: 133/76 130/88 124/80 (!) 132/94  Pulse: 80 68 77 71  Resp: $Remo'20 20 20 'sjFpD$ 18  Temp: 98.9 F (37.2 C) 98 F (36.7 C) 98.2 F (36.8 C) 98.1 F (36.7 C)  TempSrc: Oral Oral Oral   SpO2: 99%  98% 99%  Weight:      Height:       General: alert and no distress Lochia: appropriate Uterine Fundus: firm Incision: N/A DVT Evaluation: No evidence of DVT seen on physical exam. Negative Homan's sign. No cords or calf tenderness. No significant calf/ankle edema. Labs: Lab Results  Component Value Date   WBC 15.3 (H) 04/19/2020   HGB 8.4 (L) 04/19/2020   HCT 24.6 (L) 04/19/2020   MCV 83.1 04/19/2020   PLT 176 04/19/2020   CMP Latest Ref Rng & Units 07/15/2018  Glucose 65 - 99 mg/dL 82  BUN 6 - 20  mg/dL 11  Creatinine 0.57 - 1.00 mg/dL 0.71  Sodium 134 - 144 mmol/L 139  Potassium 3.5 - 5.2 mmol/L 3.6  Chloride 96 - 106 mmol/L 102  CO2 20 - 29 mmol/L 21  Calcium 8.7 - 10.2 mg/dL 9.1  Total Protein 6.0 - 8.5 g/dL 7.4  Total Bilirubin 0.0 - 1.2 mg/dL 0.4  Alkaline Phos 39 - 117 IU/L 56  AST 0 - 40 IU/L 20  ALT 0 - 32 IU/L 16   Edinburgh Score: Edinburgh Postnatal Depression Scale Screening Tool 04/19/2020  I have been able to laugh and see the funny side of things. 1  I have looked forward with enjoyment to things. 1  I have blamed myself unnecessarily when things went wrong. 2  I have been anxious or worried for no good reason. 3  I have felt scared or panicky for no good reason. 3  Things have been getting on top of me. 1  I have been so unhappy that I have had difficulty sleeping. 1  I have felt sad or miserable. 1  I have been so unhappy that I have been crying. 1  The thought of harming myself has occurred to me. 0  Edinburgh Postnatal Depression Scale Total 14      After visit meds:  Allergies as of 04/20/2020      Reactions   Penicillins    Pollen Extract       Medication List    STOP taking these medications   multivitamin-prenatal 27-0.8 MG Tabs tablet     TAKE these medications   busPIRone 15 MG tablet Commonly known as: BUSPAR Take 1 tablet (15 mg total) by mouth 2 (two) times daily.   escitalopram 10 MG tablet Commonly known as: LEXAPRO Take 1 tablet (10 mg total) by mouth daily.   ferrous sulfate 325 (65 FE) MG tablet Take 1 tablet (325 mg total) by mouth 2 (two) times daily with a meal. What changed: when to take this   ibuprofen 600 MG tablet Commonly known as: ADVIL Take 1 tablet (600 mg total) by mouth every 6 (six) hours.   meclizine 25 MG tablet Commonly known as: ANTIVERT Take 1 tablet (25 mg total) by mouth 3 (three) times daily as needed for dizziness.   ondansetron 4 MG tablet Commonly known as: Zofran Take 1 tablet (4 mg  total) by mouth every 8 (eight) hours as needed for nausea or vomiting.        Discharge home in stable condition Infant Feeding: Bottle and Breast Infant Disposition:Baby A Rooming in, Baby B in [place] (low birth weight) Discharge instruction: per After Visit Summary and Postpartum booklet. Activity: Advance as tolerated. Pelvic rest for 6  weeks.  Diet: routine diet Anticipated Birth Control: OCPs Postpartum Appointment:6 weeks Additional Postpartum F/U: Postpartum Depression checkup in 2 weeks.  Future Appointments:No future appointments. Follow up Visit:  Follow-up Information    Rubie Maid, MD Follow up in 2 week(s).   Specialties: Obstetrics and Gynecology, Radiology Why: Postpartum visit Contact information: [address] [place] [address] [phone]                   04/20/2020 Rubie Maid, MD

## 2020-04-20 NOTE — Lactation Note (Signed)
This note was copied from a baby's chart. Lactation Consultation Note  Patient Name: Amber Wilson WCHEN'I Date: 04/20/2020 Reason for consult: Follow-up assessment;Primapara;Late-preterm 34-36.6wks;Other (Comment);Multiple gestation (twins) Age:26 hours  Maternal Data Does the patient have breastfeeding experience prior to this delivery?: No Mom states she has been pumping breasts every 4 hrs and getting drops, encouraged to pump breasts at least 8 times in 24 hrs or approx. Every 3 hrs.      Feeding Mother's Current Feeding Choice: Breast Milk and Formula Mom has formula fed baby A since last night, desires to pump and bottle feed at present, Baby A is skin to skin with mom at present, mom states that baby A has been a poor feeder but did better with last formula feeding, Twin B is in SCN due to poor feedings LATCH Score                    Lactation Tools Discussed/Used    Interventions Interventions: Education [facility] name and no written on white board Discharge Pump: DEBP;Personal WIC Program: Yes Referral faxed to [facility] Consult Status Consult Status: PRN Date: 04/20/20 Follow-up type: In-patient    Ferol Luz 04/20/2020, 2:29 PM

## 2020-05-02 ENCOUNTER — Ambulatory Visit (INDEPENDENT_AMBULATORY_CARE_PROVIDER_SITE_OTHER): Payer: No Typology Code available for payment source | Admitting: Obstetrics and Gynecology

## 2020-05-02 ENCOUNTER — Encounter: Payer: Self-pay | Admitting: Obstetrics and Gynecology

## 2020-05-02 ENCOUNTER — Other Ambulatory Visit: Payer: Self-pay

## 2020-05-02 DIAGNOSIS — Z1332 Encounter for screening for maternal depression: Secondary | ICD-10-CM

## 2020-05-02 DIAGNOSIS — Z8659 Personal history of other mental and behavioral disorders: Secondary | ICD-10-CM | POA: Diagnosis not present

## 2020-05-02 DIAGNOSIS — O9270 Unspecified disorders of lactation: Secondary | ICD-10-CM

## 2020-05-02 NOTE — Progress Notes (Signed)
Televisit-Pt stated that she was doing well. Pt stated she was unable to check her weight or BP. EPDS=9.

## 2020-05-02 NOTE — Patient Instructions (Signed)
Breastfeeding Tips for a Good Latch Breastfeeding can be challenging, especially during the first few weeks after childbirth. It is normal to have some problems when you start to breastfeed your new baby, even if you have breastfed before. Latching is an important part of having a good breastfeeding experience. This refers to how your baby's mouth attaches to your nipple to breastfeed. Your baby may have trouble latching due to:  Poor positioning.  Nipple confusion. This can occur if you introduce a bottle or pacifier too early.  Abnormalities in your baby's mouth, tongue, or lips. This includes conditions like tongue-tie or cleft lip.  The shape of your nipples, such as nipples that are flat or turned in (inverted).  Your baby being born early (prematurely). Small babies often have a weak suck.  Breasts becoming overfilled with milk (engorged breasts). Breasts that are too full can make a latch difficult. If breasts are engorged, express a little milk to help soften the breast. Work with a breastfeeding specialist (Science writer) to find positions and strategies that will help make sure your baby has a good latch. How does this affect me? A poor latch may cause you to have problems such as:  Cracked or sore nipples.  Engorged breasts.  Plugged milk ducts.  Low milk supply.  Breast inflammation or infection. How does this affect my baby? A poor latch may cause your baby to not be able to feed effectively. As a result, he or she may have trouble gaining weight. Follow these instructions at home: How to position your baby  Find a comfortable place to sit or lie down, with your neck and back well supported.  If you are seated, place a pillow or rolled-up blanket under your baby to bring him or her to the level of your breast.  Make sure that your baby's belly is facing your abdomen.  Try different positions to find one that works best for you and your baby. How to help your  baby latch To begin each breastfeeding session, you may find it helpful to gently massage your breast. With your fingertips, massage from your chest wall toward your nipple in a circular motion. This encourages milk flow. If your milk flows slowly, you may need to continue this action during feeding. Position your breast for an ideal latch. Support your breast with four fingers underneath and your thumb above your nipple. Keep your fingers away from your nipple and your baby's mouth. To help your baby latch, follow these steps: 1. Stroke your baby's lips gently with your finger or nipple. 2. When your baby's mouth is open wide enough, quickly bring your baby to your breast and place your entire nipple, and as much of the areolaas possible, into your baby's mouth. The areola is the colored area around your nipple. 3. Your baby's tongue should be between his or her lower gum and your breast. 4. More areola should be visible above your baby's upper lip than below the lower lip. 5. When your baby starts sucking, you will feel a gentle pull on your nipple, but you should not feel pain. Be patient. It is common for a baby to suck for about 2-3 minutes to start the flow of breast milk. 6. Make sure that your baby's mouth is correctly positioned around your nipple. Your baby's lips should create a seal on your breast and be turned outward (everted).   General instructions  Look for the following signs that your baby has successfully latched on  to your nipple: ? The baby is quietly tugging or quietly sucking without causing you pain. ? You hear the baby swallow after every 3 or 4 sucks. ? You see muscle movement above and in front of the baby's ears while he or she is sucking.  Be aware of these signs that your baby has not successfully latched on to your nipple: ? The baby makes sucking sounds or smacking sounds while nursing. ? You have nipple pain.  If your baby is not latched well, insert your little  finger between your baby's gums and your nipple to break the seal. Then try to help your baby latch again. Contact a health care provider if:  You have cracking or soreness in your nipples that lasts longer than 1 week.  You have nipple pain. Nipple cracking and soreness are common during the first week after birth, but nipple pain is never normal.  You have breast engorgement that does not improve after 48-72 hours.  You have a plugged milk duct and a fever.  You follow suggestions for a good latch but you continue to have problems or concerns.  You have pus-like discharge coming from your breast.  Your baby is not gaining weight or loses weight. Summary  Many common breastfeeding challenges are caused by poor latching.  Latching refers to how your baby's mouth attaches to your nipple during breastfeeding.  If problems continue, seek help from a lactation consultant in your community. He or she can assess you and your baby for any latching problems.  The lactation consultant can work with you to develop a plan to overcome any breastfeeding challenges. This information is not intended to replace advice given to you by your health care provider. Make sure you discuss any questions you have with your health care provider. Document Revised: 08/17/2019 Document Reviewed: 08/17/2019 Elsevier Patient Education  [place].

## 2020-05-02 NOTE — Progress Notes (Signed)
Virtual Visit via Telephone Note  I connected with Amber Wilson on 05/02/20 at  2:30 PM EST by telephone and verified that I am speaking with the correct person using two identifiers.  Location: Patient: Home Provider: Office    I discussed the limitations, risks, security and privacy concerns of performing an evaluation and management service by telephone and the availability of in person appointments. I also discussed with the patient that there may be a patient responsible charge related to this service. The patient expressed understanding and agreed to proceed.   History of Present Illness: Amber Wilson is a 25 y.o. G50P0000 female who presents for 2 week postpartum mood check. She is s/p NSVD of twins, born at 88 weeks due to [place] twin B.  She has a history of depression, on no meds. Reports Twin A "Archie" is at home doing well. Is having some issues with latching and has an appointment scheduled with lactation consultant next week at the [facility] office. Is still pumping every 2 hrs. Twin B "Benjie" is still in the hospital, working on feeds, but should be due to come home within the next week. She notes that she does have some feelings of sadness but mostly surrounding not having both babies at home.  She is otherwise doing ok. Has a good support system.     Observations/Objective:  Height 5\' 3"  (1.6 m), currently breastfeeding. Gen App: Patient sounds pleasant, no acute distress.  Psych: normal speech, normal affect, no depressed mood  Depression screen [facility] 2/9 04/12/2020 02/16/2019 08/23/2018 08/20/2018 07/15/2018  Decreased Interest 1 2 1 1 1   Down, Depressed, Hopeless 1 2 2 2 2   PHQ - 2 Score 2 4 3 3 3   Altered sleeping 2 3 2 2 2   Tired, decreased energy 2 2 2 2 2   Change in appetite 1 3 2  - 2  Feeling bad or failure about yourself  2 0 2 - 2  Trouble concentrating 0 2 0 - 0  Moving slowly or fidgety/restless 0 3 0 - 2  Suicidal thoughts 0 0 0 - 1  PHQ-9 Score 9 17 11 7  14   Difficult doing work/chores Somewhat difficult Very difficult Somewhat difficult - Somewhat difficult     Assessment and Plan:  Postpartum state H/o depression Lactating problems  Follow Up Instructions:  Follow up in 4 weeks for final postpartum visit.  Follow with lactation consultant as previously scheduled.    I discussed the assessment and treatment plan with the patient. The patient was provided an opportunity to ask questions and all were answered. The patient agreed with the plan and demonstrated an understanding of the instructions.   The patient was advised to call back or seek an in-person evaluation if the symptoms worsen or if the condition fails to improve as anticipated.  I provided 11 minutes of non-face-to-face time during this encounter.   Rubie Maid, MD  [facility]

## 2020-05-22 ENCOUNTER — Ambulatory Visit (INDEPENDENT_AMBULATORY_CARE_PROVIDER_SITE_OTHER): Payer: No Typology Code available for payment source | Admitting: Licensed Clinical Social Worker

## 2020-05-22 DIAGNOSIS — F411 Generalized anxiety disorder: Secondary | ICD-10-CM

## 2020-05-23 NOTE — Progress Notes (Signed)
THERAPIST PROGRESS NOTE  Session Time: 1:00 pm-1:55 pm  Type of Therapy: Individual Therapy   Purpose of Session/Treatment Goals addressed: "Ronnesha will manage anxiety as evidenced by reducing panic attacks, managing triggers, and coping with stressors for 5 out of 7 days for 60 days."  Interventions: Therapist utilized CBT and Solution focused brief therapy to address anxiety. Therapist provided space and support to patient as she shared her thoughts and feelings in session. Therapist explored patient's experience giving birth and her sons being in the NICU. Therapist had patient identify what has helped her adjust to parenting and manage her anxiety.   Effectiveness: Patient was oriented x5 (person, place, situation, time, and object). Patient was dressed casually, and groomed appropriately. Patient was alert, engaged, and pleasant during session. Patient gave birth to her twins. They were early and had to go to NICU. Patient stayed at the hospital to help care for her son. She was frustrated, sad, and scared during this situation. She was able to bring one son home and her other son was in the NICU for a few weeks. She has both her son's home now and is adjusting to caring for twins. Patient and her fiance are adjusting to parenthood. Patient is tired. She was depressed and anxious when her sons were in the hospital but feels like her mood and anxiety are stable now. She is planning on looking for work. She is still trying to process the experience of being in the hospital. She has realized that she can do hard things and deal with unexpected situations.      Patient engaged in session. Patient responded well to interventions. Patient continues to meet criteria for Generalized Anxiety Disorder. Patient will continue in outpatient therapy due to being the least restrictive service to meet her needs. Patient made moderae progress on her goals.   Suicidal/Homicidal: Negativewithout intent/plan  Plan:  Return again in 1-2 weeks  Diagnosis: Axis I: Generalized Anxiety Disorder    Axis II: No diagnosis   [name], LCSW 05/23/2020

## 2020-06-05 ENCOUNTER — Ambulatory Visit (INDEPENDENT_AMBULATORY_CARE_PROVIDER_SITE_OTHER): Payer: Medicaid Other | Admitting: Licensed Clinical Social Worker

## 2020-06-05 DIAGNOSIS — F411 Generalized anxiety disorder: Secondary | ICD-10-CM | POA: Diagnosis not present

## 2020-06-06 NOTE — Progress Notes (Signed)
THERAPIST PROGRESS NOTE  Session Time: 2:00 pm-2:45 pm  Type of Therapy: Individual Therapy   Purpose of Session/Treatment Goals addressed: "Amber Wilson will manage anxiety as evidenced by reducing panic attacks, managing triggers, and coping with stressors for 5 out of 7 days for 60 days."  Interventions: Therapist utilized CBT and Solution focused brief therapy to address anxiety. Therapist provided support and empathy to patient while she shared her thoughts and feelings. Therapist assessed for post-partum depression. Therapist had patient verbalize stressors. Therapist worked with patient to identify small steps to reduce her anxiety and improve mood.    Effectiveness: Patient was oriented x5 (person, place, situation, time, and object). Patient was dressed casually, and appropriately groomed. Patient is doing ok. She has some anxiety and depressive symptoms. She is taking care of her twins and working with her partner to split the responsibilities. 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 She is working on trying to find both so she doesn't have to be away from her twins. She has applied to several jobs and has been offered them but they only give a discount. She is stressed about this but is hopeful. She also feels depressive due to being stuck in the home. She feels like warmer weather and work will help her.     Patient engaged in session. Patient responded well to interventions. Patient continues to meet criteria for Generalized Anxiety Disorder. Patient will continue in outpatient therapy due to being the least restrictive service to meet her needs. Patient made moderate progress on her goals.   Suicidal/Homicidal: Negativewithout intent/plan  Plan: Return again in 1-2 weeks  Diagnosis: Axis I: Generalized Anxiety Disorder    Axis II: No diagnosis   [name], LCSW 06/06/2020

## 2020-06-13 ENCOUNTER — Ambulatory Visit (INDEPENDENT_AMBULATORY_CARE_PROVIDER_SITE_OTHER): Payer: Medicaid Other | Admitting: Obstetrics and Gynecology

## 2020-06-13 ENCOUNTER — Other Ambulatory Visit: Payer: Self-pay

## 2020-06-13 ENCOUNTER — Other Ambulatory Visit: Payer: Self-pay | Admitting: Obstetrics and Gynecology

## 2020-06-13 ENCOUNTER — Encounter: Payer: Self-pay | Admitting: Obstetrics and Gynecology

## 2020-06-13 DIAGNOSIS — Z8659 Personal history of other mental and behavioral disorders: Secondary | ICD-10-CM

## 2020-06-13 DIAGNOSIS — F53 Postpartum depression: Secondary | ICD-10-CM | POA: Diagnosis not present

## 2020-06-13 DIAGNOSIS — Z30011 Encounter for initial prescription of contraceptive pills: Secondary | ICD-10-CM

## 2020-06-13 DIAGNOSIS — O99345 Other mental disorders complicating the puerperium: Secondary | ICD-10-CM

## 2020-06-13 DIAGNOSIS — O30009 Twin pregnancy, unspecified number of placenta and unspecified number of amniotic sacs, unspecified trimester: Secondary | ICD-10-CM

## 2020-06-13 DIAGNOSIS — O9081 Anemia of the puerperium: Secondary | ICD-10-CM

## 2020-06-13 LAB — POCT URINE PREGNANCY: Preg Test, Ur: NEGATIVE

## 2020-06-13 MED ORDER — NORETHIN ACE-ETH ESTRAD-FE 1-20 MG-MCG PO TABS: 1.0000 | ORAL_TABLET | Freq: Every day | ORAL | 3 refills | Status: DC

## 2020-06-13 NOTE — Progress Notes (Signed)
OBSTETRICS POSTPARTUM CLINIC PROGRESS NOTE  Subjective:     Amber Wilson is a 25 y.o. G79P0000 female who presents for a postpartum visit. She is 8 weeks postpartum following a spontaneous vaginal delivery. I have fully reviewed the prenatal and intrapartum course. The delivery was at 36.5 gestational weeks, induction of labor for growth restriction of second twin.  Anesthesia: epidural. Postpartum course has been well. Baby A's (Archie) course has been well. Baby B's (Benjie) course was complicated by size and feeding issues, remained in the hospital after birth. Both are doing well now. Both babies are feeding by formula (prescription). Bleeding: patient has resumed menses. Bowel function is normal. Bladder function is normal. Patient is sexually active, protected (using condoms). Contraception method desired is OCP (estrogen/progesterone). Postpartum depression screening: positive.   Edinburgh Postnatal Depression Scale - 06/13/20 1514      Edinburgh Postnatal Depression Scale:  In the Past 7 Days   I have been able to laugh and see the funny side of things. 1    I have looked forward with enjoyment to things. 1    I have blamed myself unnecessarily when things went wrong. 2    I have been anxious or worried for no good reason. 2    I have felt scared or panicky for no good reason. 1    Things have been getting on top of me. 2    I have been so unhappy that I have had difficulty sleeping. 1    I have felt sad or miserable. 1    I have been so unhappy that I have been crying. 1    The thought of harming myself has occurred to me. 0    Edinburgh Postnatal Depression Scale Total 12            The following portions of the patient's history were reviewed and updated as appropriate: allergies, current medications, past family history, past medical history, past social history, past surgical history and problem list.  Review of Systems A comprehensive review of systems was negative  except for: Musculoskeletal: positive for back pain (lower back) and intermittent muscles spasms. Has been having these since childbirth. Thinks it is related to her epidural.    Objective:    BP 105/69   Pulse 76   Ht 5\' 3"  (1.6 m)   Wt 128 lb 8 oz (58.3 kg)   Breastfeeding No   BMI 22.76 kg/m   General:  alert and no distress   Breasts:  inspection negative, no nipple discharge or bleeding, no masses or nodularity palpable  Lungs: clear to auscultation bilaterally  Heart:  regular rate and rhythm, S1, S2 normal, no murmur, click, rub or gallop  Back: Non-tender, normal curvature  Abdomen: soft, non-tender; bowel sounds normal; no masses,  no organomegaly.    Vulva:  normal  Vagina: normal vagina, no discharge, exudate, lesion, or erythema  Cervix:  no cervical motion tenderness and no lesions  Corpus: normal size, contour, position, consistency, mobility, non-tender  Adnexa:  normal adnexa and no mass, fullness, tenderness  Rectal Exam: Not performed.         Labs:  Lab Results  Component Value Date   HGB 8.4 (L) 04/19/2020    Results for orders placed or performed in visit on 06/13/20  POCT urine pregnancy  Result Value Ref Range   Preg Test, Ur Negative Negative     Assessment:   1. Postpartum care following vaginal delivery   2.  Postpartum anemia   3. Pregnancy, twin, delivered   4. Encounter for initial prescription of contraceptive pills   5. Postpartum depression      Plan:   1. Contraception: OCP (estrogen/progesterone). Prescribed Junel. Advised on Sunday start. Continue backup method with condoms for at least 1 week. UPT negative today.  2. Will check Hgb for h/o anemia.  3.Edinburgh screening positive for depression, however patient notes she feels ok.  Just normal stressors of managing twin infants. Does have a good support system at home.  To f/u if symptoms worsen. Has prior history of anxiety and depression. Is on Buspar and Lexapro. Can also  encourage counseling if not already involved.  4. Follow up in: 6 months for annual exam, or sooner as needed.    Rubie Maid, MD [facility]

## 2020-06-13 NOTE — Progress Notes (Signed)
Pt present for ppv. Pt is currently not breastfeeding and would like to have OCP for birth control. EPDS=12

## 2020-06-13 NOTE — Patient Instructions (Signed)
Perinatal Mood and Anxiety Disorder Perinatal mood and anxiety disorder (PMAD) is a mental health condition that happens when a person feels excessive sadness, anger, or worry and tension (anxiety) during pregnancy or during the first few months after the birth. This condition can last a few months or may continue for years if left untreated. PMAD may cause serious problems for the mother, her baby, or the father if not properly managed. Depression and anxiety can interfere with the ability to take care of the baby. It also may affect work, school, relationships, and other everyday activities. Having the baby blues is considered normal. Mild to moderate levels of sadness, exhaustion, and generally struggling with being a parent are considered the blues. Many parents experience these during the first 1-2 weeks after giving birth. If these symptoms become worse or last too long, it may be PMAD. What are the causes? The exact cause of this condition is not known. It may result from a combination of hormone changes and biological, social, and psychological factors. What increases the risk? The following factors may make you more likely to develop this condition:  Having a personal or family history of depression, anxiety, or mood disorders.  Experiencing a stressful life event during pregnancy, such as the death of a loved one.  Having additional life stress, such as being a single parent.  Having thyroid problems. What are the signs or symptoms? Symptoms of this condition include:  Physical symptoms, such as: ? Panic attacks. These are intense episodes of fear or discomfort that may also cause sweating, nausea, shortness of breath, or fear of dying. They usually last 5-15 minutes but can last longer. ? Performing repetitive tasks to relieve stress or worry (obsessive compulsive disorder, or OCD). ? Problems with appetite or sleep.  Emotional symptoms, such as: ? Excessive worry about problems or  feeling like something bad will happen (generalized anxiety disorder). ? Phobias, which are fears of certain objects or situations. ? Separation anxiety, or fear and stress about leaving certain people or loved ones.  Behavioral symptoms, such as: ? Depression, or lack of motivation and energy. ? Intense mood swings involving emotional highs and lows. ? Feeling out of control or like you are going crazy. ? Having difficulty bonding with your baby. Some people also have trouble relaxing, problems concentrating, problems sleeping, frequent nightmares, and disturbing thoughts. PMAD can be different for everyone and can affect men as well as women. How is this diagnosed? This condition is diagnosed based on a physical exam and mental evaluation.  In some cases, your health care provider may use an anxiety or depression screening tool. This includes a list of questions that can help a health care provider diagnose PMAD.  You may be referred to a mental health expert who specializes in treating PMAD. How is this treated? This condition may be treated with:  Talk therapy with a mental health professional. This may be family therapy, marriage therapy, cognitive behavioral therapy, or interpersonal therapy.  Medicines. Your health care provider will discuss medicines that are safe to use during pregnancy and breastfeeding.  Stress reduction therapies, such as mindfulness, deep breathing, or guided muscle relaxation.  Support groups, early childhood education, or other groups to help with being a parent.   Follow these instructions at home: Lifestyle  Do not use any products that contain nicotine or tobacco. These products include cigarettes, chewing tobacco, and vaping devices, such as e-cigarettes. If you need help quitting, ask your health care provider.  Do not drink alcohol when you are pregnant. It is also safest not to drink alcohol if you are breastfeeding. ? After your baby is born, if  you drink alcohol:  Limit how much you have to 0-1 drink a day.  Be aware of how much alcohol is in your drink. In the [place] one drink equals one 12 oz bottle of beer (355 mL), one 5 oz glass of wine (148 mL), or one 1 oz glass of hard liquor (44 mL).  Consider joining a support group for new mothers. Ask your health care provider for recommendations.  Take good care of yourself. Make sure you: ? Get as much rest as possible. Talk with your partner about sharing the responsibility of getting up with your baby if possible. Make sleep a priority. ? Eat a healthy diet. This includes plenty of fruits and vegetables, whole grains, and lean proteins. ? Exercise regularly, as told by your health care provider. Ask your health care provider what exercises are safe for you. Talk with your partner about making sure you both have opportunities to exercise. General instructions  Take over-the-counter and prescription medicines only as told by your health care provider.  Talk with your partner or family members about your feelings during pregnancy. Share your concerns, needs, or anxieties with each other. Do not be afraid to ask for help. Find a mental health professional, if needed.  Ask for help with tasks or chores when you need it. Ask friends and family members to provide meals, watch your children, or help with cleaning. If friends or family are not able to help, consider finding a licensed child care provider or professional house cleaner if needed. Let your partner know what you need. He or she may be struggling too.  Keep all follow-up visits. This is important. Contact a health care provider if:  You or people close to you notice that you have symptoms of anxiety or depression.  Your symptoms of anxiety or depression get worse.  You take medicines and have side effects that are uncomfortable or difficult to tolerate. Get help right away if:  You feel like hurting yourself, your baby, or  someone else. If you feel like you may hurt yourself or others, or have thoughts about taking your own life, get help right away. Go to your nearest emergency department or:  Call your local emergency services (911 in the [place]  Call a suicide crisis helpline, such as the [place] at 613-458-6907. This is open 24 hours a day in the [place]  Text the Crisis Text Line at (830)653-3510 (in the [place] Summary  Perinatal mood and anxiety disorder (PMAD) is when a woman or her partner feels excessive sadness, anger, or worry and tension (anxiety) during pregnancy or during the first few months after the birth.  PMAD may include depression, intense mood swings, panic attacks, separation anxiety, phobias, or generalized anxiety.  PMAD can cause problems for the mother, the baby, or the father if not properly managed.  This condition is treated with medicines, talk therapy, stress reduction therapies, or a combination of treatments.  Talk with your partner or family members about your concerns or fears. Ask for help when you need it. This information is not intended to replace advice given to you by your health care provider. Make sure you discuss any questions you have with your health care provider. Document Revised: 08/12/2019 Document Reviewed: 08/12/2019 Elsevier Patient Education  2021 [employer].

## 2020-06-14 ENCOUNTER — Other Ambulatory Visit: Payer: Medicaid Other

## 2020-06-14 DIAGNOSIS — O9081 Anemia of the puerperium: Secondary | ICD-10-CM | POA: Diagnosis not present

## 2020-06-15 LAB — HEMOGLOBIN AND HEMATOCRIT, BLOOD
Hematocrit: 33.8 % — ABNORMAL LOW (ref 34.0–46.6)
Hemoglobin: 10.6 g/dL — ABNORMAL LOW (ref 11.1–15.9)

## 2020-06-19 ENCOUNTER — Ambulatory Visit (INDEPENDENT_AMBULATORY_CARE_PROVIDER_SITE_OTHER): Payer: Medicaid Other | Admitting: Licensed Clinical Social Worker

## 2020-06-19 DIAGNOSIS — F411 Generalized anxiety disorder: Secondary | ICD-10-CM

## 2020-06-20 NOTE — Progress Notes (Signed)
Virtual Visit via Video Note  I connected with Amber Wilson on 06/20/20 at  2:00 PM EDT by a video enabled telemedicine application and verified that I am speaking with the correct person using two identifiers.  Location: Patient: Home Provider: Office   I discussed the limitations of evaluation and management by telemedicine and the availability of in person appointments. The patient expressed understanding and agreed to proceed.   THERAPIST PROGRESS NOTE  Session Time: 2:00 pm-2:45 pm  Type of Therapy: Individual Therapy   Purpose of Session/Treatment Goals addressed: "Shawntia will manage anxiety as evidenced by reducing panic attacks, managing triggers, and coping with stressors for 5 out of 7 days for 60 days."  Interventions: Therapist utilized CBT and Solution focused brief therapy to address anxiety. Therapist provided support and empathy to patient while she discussed her stressors. Therapist explored patient's stressors for anxiety. Therapist worked with patient to identify a small step to reduce anxiety and be "ok."   Effectiveness: Patient was oriented x5 (person, place, situation, time, and object). Patient was dressed casually, and appropriately groomed. Patient was alert, engaged, and sad during session. Patient got a job with [employer] and she is able to bring her children with her. Her fiance lost his job. He was let go for taking too much time off some of which was when their son was in the NICU and then other days he called in because he didn't feel like working. Patient is frustrated at him over the latter reasons. Patient and her fiance have had arguments/disagreements over parenting/approach to taking care of the twins. Both are tired and more sensitive right now. Patient feels like she is constantly being told outright that she is wrong by her fiance. She doesn't like to feel like she is always doing something wrong. Patient also has a somewhat strained relationship with a friend  that she asked to be a bridesmaid in her wedding. She has reached out to this friend repeatedly and her friend will wait weeks to respond. Her friend has a history of being flaky. She sent her a text a gave her friend an "out" by saying if it is too much for her right now then she doesn't have to be a bridesmaid. Patient noted her friends mother has cancer and could be overwhelmed. Patient had contacted her several times for bridesmaid dress fitting, etc. Patient feels like not getting a response frustrates her and further strains the relationship. After discussion, patient was able to identify what she needs to get through the day (her first day of her new job). She is going to talk to her fiance and apologize. She noted he doesn't like to talk about issues and it won't be resolved before she leaves but wants to just be "ok" in their relationship before she goes to work.     Patient engaged in session. Patient responded well to interventions. Patient continues to meet criteria for Generalized Anxiety Disorder. Patient will continue in outpatient therapy due to being the least restrictive service to meet her needs. Patient made moderate progress on her goals.   Suicidal/Homicidal: Negativewithout intent/plan  Plan: Return again in 2-4 weeks  Diagnosis: Axis I: Generalized Anxiety Disorder    Axis II: No diagnosis   I discussed the assessment and treatment plan with the patient. The patient was provided an opportunity to ask questions and all were answered. The patient agreed with the plan and demonstrated an understanding of the instructions.   The patient was advised to  call back or seek an in-person evaluation if the symptoms worsen or if the condition fails to improve as anticipated.  I provided 45 minutes of non-face-to-face time during this encounter.   [name], LCSW 06/20/2020

## 2020-06-22 ENCOUNTER — Ambulatory Visit: Payer: Self-pay

## 2020-07-03 ENCOUNTER — Ambulatory Visit (HOSPITAL_COMMUNITY): Payer: No Typology Code available for payment source | Admitting: Licensed Clinical Social Worker

## 2020-07-04 ENCOUNTER — Ambulatory Visit (INDEPENDENT_AMBULATORY_CARE_PROVIDER_SITE_OTHER): Payer: Medicaid Other | Admitting: Licensed Clinical Social Worker

## 2020-07-04 DIAGNOSIS — F411 Generalized anxiety disorder: Secondary | ICD-10-CM

## 2020-07-06 NOTE — Progress Notes (Signed)
Virtual Visit via Video Note  I connected with Amber Wilson on 07/06/20 at  9:00 AM EDT by a video enabled telemedicine application and verified that I am speaking with the correct person using two identifiers.  Location: Patient: Home Provider: Office   I discussed the limitations of evaluation and management by telemedicine and the availability of in person appointments. The patient expressed understanding and agreed to proceed.   THERAPIST PROGRESS NOTE  Session Time: 9:00 am-9:45 am  Type of Therapy: Individual Therapy   Purpose of Session/Treatment Goals addressed: "Amber Wilson will manage anxiety as evidenced by reducing panic attacks, managing triggers, and coping with stressors for 5 out of 7 days for 60 days."  Interventions: Therapist utilized CBT and Solution focused brief therapy to address anxiety. Therapist provided support and empathy to patient as she shared her thoughts and feelings in session. Therapist processed patient's feelings and experience with the trauma of her child being in the NICU and how it impacts her mood now.    Effectiveness: Patient was oriented x5 (person, place, situation, time, and object). Patient was alert, engaged, and pleasant during session. Patient was casually dressed, and appropriately groomed. Patient held her twins during session. Patient has felt down. She feels like she missed out time when her children were in the NICU and then she had to start working. Patient feels like the whole experience of the birth and NICU are "catching up" with her. Patient feels like she missed out on some of the bond related to breastfeeding. She does feel bonded to her twins.  Patient understood that she has been in "survival" mode since the NICU and managing being a parent. Patient has not spoke to her partner about how he is feeling about his experience of the birth and NICU. Patient understands that she needs to have this conversation with him to help both her and  him process the trauma of the NICU.     Patient engaged in session. Patient responded well to interventions. Patient continues to meet criteria for Generalized Anxiety Disorder. Patient will continue in outpatient therapy due to being the least restrictive service to meet her needs. Patient made moderate progress on her goals.   Suicidal/Homicidal: Negativewithout intent/plan  Plan: Return again in 2-4 weeks  Diagnosis: Axis I: Generalized Anxiety Disorder    Axis II: No diagnosis   I discussed the assessment and treatment plan with the patient. The patient was provided an opportunity to ask questions and all were answered. The patient agreed with the plan and demonstrated an understanding of the instructions.   The patient was advised to call back or seek an in-person evaluation if the symptoms worsen or if the condition fails to improve as anticipated.  I provided 45 minutes of non-face-to-face time during this encounter.   [name], LCSW 07/06/2020

## 2020-08-09 ENCOUNTER — Ambulatory Visit (HOSPITAL_COMMUNITY): Payer: Medicaid Other | Admitting: Licensed Clinical Social Worker

## 2020-08-09 NOTE — Transfer of Care (Signed)
Immediate Anesthesia Transfer of Care Note  Patient: Amber Wilson  Procedure(s) Performed: CESAREAN SECTION  Patient Location: PACU and Mother/Baby  Anesthesia Type:Epidural  Level of Consciousness: awake, alert  and oriented  Airway & Oxygen Therapy: Patient Spontanous Breathing  Post-op Assessment: Report given to RN and Post -op Vital signs reviewed and stable  Post vital signs: stable  Last Vitals:  Vitals Value Taken Time  BP    Temp    Pulse    Resp    SpO2      Last Pain:  Vitals:   04/20/20 1539  TempSrc: Oral  PainSc:          Complications: No notable events documented.

## 2020-08-22 ENCOUNTER — Ambulatory Visit (INDEPENDENT_AMBULATORY_CARE_PROVIDER_SITE_OTHER): Payer: Medicaid Other | Admitting: Licensed Clinical Social Worker

## 2020-08-22 DIAGNOSIS — F411 Generalized anxiety disorder: Secondary | ICD-10-CM | POA: Diagnosis not present

## 2020-08-22 NOTE — Progress Notes (Signed)
Virtual Visit via Video Note  I connected with Amber Wilson on 08/22/20 at  1:00 PM EDT by a video enabled telemedicine application and verified that I am speaking with the correct person using two identifiers.  Location: Patient: Home Provider: Office   I discussed the limitations of evaluation and management by telemedicine and the availability of in person appointments. The patient expressed understanding and agreed to proceed.   THERAPIST PROGRESS NOTE  Session Time: 1:00 pm-1:45 pm  Type of Therapy: Individual Therapy   Purpose of Session/Treatment Goals addressed: "Amber Wilson will manage anxiety as evidenced by reducing panic attacks, managing triggers, and coping with stressors for 5 out of 7 days for 60 days."  Interventions: Therapist utilized CBT and Solution focused brief therapy to address anxiety. Therapist provided support and empathy to patient. Therapist explored patient's triggers for depression. Therapist worked with patient to identify ways to "get a break."   Effectiveness: Patient was oriented x5 (person, place, situation, time, and object). Patient was casually dressed, and appropriately groomed. Patient was alert, engaged, pleasant, and cooperative during session. Patient has had an increase in depressive symptoms. She takes the reaction of her son Amber Wilson really personal. She feels like he doesn't like her because he cries a lot with her but not his father. Patient understands logically that this is not true but those feelings pop up for her. She feels like she is unable to take care of herself like she would like to due to not eating regularly, not sleeping well, and feeling overwhelmed. She doesn't get a break. She has left her twins with their father but he would call and text for her to come home to help him. She feels like she hasn't gotten a break since she had her twins. She doesn't want to burden her family with watching them both because she recognizes that it can  be hard to manage. Patient agreed to take time for herself and communicate her needs.    Patient engaged in session. Patient responded well to interventions. Patient continues to meet criteria for Generalized Anxiety Disorder. Patient will continue in outpatient therapy due to being the least restrictive service to meet her needs. Patient made moderate progress on her goals.   Suicidal/Homicidal: Negativewithout intent/plan  Plan: Return again in 2-4 weeks  Diagnosis: Axis I: Generalized Anxiety Disorder    Axis II: No diagnosis   I discussed the assessment and treatment plan with the patient. The patient was provided an opportunity to ask questions and all were answered. The patient agreed with the plan and demonstrated an understanding of the instructions.   The patient was advised to call back or seek an in-person evaluation if the symptoms worsen or if the condition fails to improve as anticipated.  I provided 45 minutes of non-face-to-face time during this encounter.   [name], LCSW 08/22/2020

## 2020-09-05 ENCOUNTER — Ambulatory Visit (INDEPENDENT_AMBULATORY_CARE_PROVIDER_SITE_OTHER): Payer: Medicaid Other | Admitting: Licensed Clinical Social Worker

## 2020-09-05 DIAGNOSIS — F411 Generalized anxiety disorder: Secondary | ICD-10-CM

## 2020-09-06 NOTE — Progress Notes (Signed)
Virtual Visit via Video Note  I connected with Amber Wilson on 09/06/20 at  1:00 PM EDT by a video enabled telemedicine application and verified that I am speaking with the correct person using two identifiers.  Location: Patient: Home Provider: Office   I discussed the limitations of evaluation and management by telemedicine and the availability of in person appointments. The patient expressed understanding and agreed to proceed.   THERAPIST PROGRESS NOTE  Session Time: 1:00 pm-1:45 pm  Type of Therapy: Individual Therapy   Purpose of Session/Treatment Goals addressed: "Amber Wilson will manage anxiety as evidenced by reducing panic attacks, managing triggers, and coping with stressors for 5 out of 7 days for 60 days."  Interventions: Therapist utilized CBT and Solution focused brief therapy to address anxiety. Therapist provided support and empathy to patient during session. Therapist explored patient's relationship with her son and identified steps she could take to increase her bond with her son.   Effectiveness: Patient was oriented x5 (person, place, situation, time, and object). Patient was casually dressed, and appropriately groomed. Patient was alert, engaged, pleasant, and cooperative during session. Patient has been feeling a little better overall. She still feels like she is not as bonded with one of her twins. After discussion, patient was able to connect that others don't soothe this twin any quicker than her, and she logically knows that her son loves her but feels like he doesn't. Patient feels like the time her son spent in the NICU impacted their relationship because she focused on her other twin she had home. Patient is going to be intentional about the time she spends with her son and work on her bond as well her comfort level with the twin she doesn't feel like likes her.    Patient engaged in session. Patient responded well to interventions. Patient continues to meet  criteria for Generalized Anxiety Disorder. Patient will continue in outpatient therapy due to being the least restrictive service to meet her needs. Patient made moderate progress on her goals.   Suicidal/Homicidal: Negativewithout intent/plan  Plan: Return again in 2-4 weeks  Diagnosis: Axis I: Generalized Anxiety Disorder    Axis II: No diagnosis   I discussed the assessment and treatment plan with the patient. The patient was provided an opportunity to ask questions and all were answered. The patient agreed with the plan and demonstrated an understanding of the instructions.   The patient was advised to call back or seek an in-person evaluation if the symptoms worsen or if the condition fails to improve as anticipated.  I provided 45 minutes of non-face-to-face time during this encounter.   [name], LCSW 09/06/2020

## 2020-09-19 ENCOUNTER — Ambulatory Visit (INDEPENDENT_AMBULATORY_CARE_PROVIDER_SITE_OTHER): Payer: Medicaid Other | Admitting: Licensed Clinical Social Worker

## 2020-09-19 DIAGNOSIS — F411 Generalized anxiety disorder: Secondary | ICD-10-CM

## 2020-09-20 NOTE — Progress Notes (Signed)
Virtual Visit via Video Note  I connected with Amber Wilson on 09/20/20 at  1:00 PM EDT by a video enabled telemedicine application and verified that I am speaking with the correct person using two identifiers.  Location: Patient: Home Provider: Office   I discussed the limitations of evaluation and management by telemedicine and the availability of in person appointments. The patient expressed understanding and agreed to proceed.   THERAPIST PROGRESS NOTE  Session Time: 1:00 pm-1:45 pm  Type of Therapy: Individual Therapy   Purpose of Session/Treatment Goals addressed: "Amber Wilson will manage anxiety as evidenced by reducing panic attacks, managing triggers, and coping with stressors for 5 out of 7 days for 60 days."  Interventions: Therapist utilized CBT and Solution focused brief therapy to address anxiety. Therapist provided support and space to patient as she shared her thoughts and feelings in session. Therapist explored patient's relationship with her children. Therapist discussed patient's physical health and had patient identify steps to take to increase weight.   Effectiveness: patient was oriented x5 (person, place, situation, time, and object). Patient was casually dressed, and appropriately groomed. Patient was alert, engaged, pleasant, and cooperative. Patient feels like her mood is stable. She is experiencing depressive symptoms such as fatigue, and difficulty sleeping. Patient continues to feel like her son doesn't like her but she knows this isn't logical. Patient is spending time with him and does feel like their relationship is improving. Patient has spoke with her partner about this feeling, and he admitted he felt this way with their other son. Patient has been losing weight. She has not been eating. Patient feels like it takes too much effort to make herself food at times. She agreed to buy some pre-made protein shakes, etc that are easy to grab and eat.     Patient  engaged in session. Patient responded well to interventions. Patient continues to meet criteria for Generalized Anxiety Disorder. Patient will continue in outpatient therapy due to being the least restrictive service to meet her needs. Patient made moderate progress on her goals.   Suicidal/Homicidal: Negativewithout intent/plan  Plan: Return again in 2-4 weeks  Diagnosis: Axis I: Generalized Anxiety Disorder    Axis II: No diagnosis   I discussed the assessment and treatment plan with the patient. The patient was provided an opportunity to ask questions and all were answered. The patient agreed with the plan and demonstrated an understanding of the instructions.   The patient was advised to call back or seek an in-person evaluation if the symptoms worsen or if the condition fails to improve as anticipated.  I provided 45 minutes of non-face-to-face time during this encounter.   [name], LCSW 09/20/2020

## 2020-10-16 ENCOUNTER — Ambulatory Visit (INDEPENDENT_AMBULATORY_CARE_PROVIDER_SITE_OTHER): Payer: Medicaid Other | Admitting: Licensed Clinical Social Worker

## 2020-10-16 DIAGNOSIS — F411 Generalized anxiety disorder: Secondary | ICD-10-CM

## 2020-10-16 NOTE — Progress Notes (Signed)
Virtual Visit via Video Note  I connected with Amber Wilson on 10/16/20 at  8:00 AM EDT by a video enabled telemedicine application and verified that I am speaking with the correct person using two identifiers.  Location: Patient: Home Provider: Office   I discussed the limitations of evaluation and management by telemedicine and the availability of in person appointments. The patient expressed understanding and agreed to proceed.   THERAPIST PROGRESS NOTE  Session Time: 8:00 am-8:45 am  Type of Therapy: Individual Therapy   Purpose of Session/Treatment Goals addressed: "Konni will manage anxiety as evidenced by reducing panic attacks, managing triggers, and coping with stressors for 5 out of 7 days for 60 days."  Interventions: Therapist utilized CBT and Solution focused brief therapy to address anxiety. Therapist provided support and empathy to patient during session. Therapist explored patient's relationships. Therapist worked with patient in identifying ways to set boundaries with her friends.   Effectiveness: Patient was oriented x5 (person, place, situation, time, and object). Patient was casually dressed, and appropriately groomed. Patient was alert, engaged, pleasant, and cooperative. Patient was sick as well as her family. Patient noted that they have tested negative for COVID. Patient is struggling with two relationships. She feels like she puts in a lot of effort to reach out to a friend and her friend doesn't respond for weeks. Her friend has been flaky since childhood, has a genetic disease, and is pregnant so patient understands why her friend may not respond immediately but she doesn't hear from her for weeks. Patient reaches out to her about every other day. Patient is also struggling with her mother in law. Her mother in law makes up excuses to why she can't come to see patient's sons. Patient also noted that her mother in law will say she is coming over and never show up.  Patient is working on finding a balance with reaching out to her friend and mother in Sports coach. Patient understood not to completely shut them off but also reduce how much she is reaching out.     Patient engaged in session. Patient responded well to interventions. Patient continues to meet criteria for Generalized Anxiety Disorder. Patient will continue in outpatient therapy due to being the least restrictive service to meet her needs. Patient made moderate progress on her goals.   Suicidal/Homicidal: Negativewithout intent/plan  Plan: Return again in 2-4 weeks  Diagnosis: Axis I: Generalized Anxiety Disorder    Axis II: No diagnosis   I discussed the assessment and treatment plan with the patient. The patient was provided an opportunity to ask questions and all were answered. The patient agreed with the plan and demonstrated an understanding of the instructions.   The patient was advised to call back or seek an in-person evaluation if the symptoms worsen or if the condition fails to improve as anticipated.  I provided 45 minutes of non-face-to-face time during this encounter.   [name], LCSW 10/16/2020

## 2020-10-30 ENCOUNTER — Ambulatory Visit (HOSPITAL_COMMUNITY): Payer: Medicaid Other | Admitting: Licensed Clinical Social Worker

## 2020-11-21 ENCOUNTER — Ambulatory Visit (INDEPENDENT_AMBULATORY_CARE_PROVIDER_SITE_OTHER): Payer: Medicaid Other | Admitting: Licensed Clinical Social Worker

## 2020-11-21 DIAGNOSIS — F411 Generalized anxiety disorder: Secondary | ICD-10-CM

## 2020-11-22 NOTE — Progress Notes (Signed)
Virtual Visit via Video Note  I connected with Amber Wilson on 11/22/20 at  8:00 AM EDT by a video enabled telemedicine application and verified that I am speaking with the correct person using two identifiers.  Location: Patient: Home Provider: Office   I discussed the limitations of evaluation and management by telemedicine and the availability of in person appointments. The patient expressed understanding and agreed to proceed.   THERAPIST PROGRESS NOTE  Session Time: 8:00 am-8:45 am  Type of Therapy: Individual Therapy   Purpose of Session/Treatment Goals addressed: "Bayan will manage anxiety as evidenced by reducing panic attacks, managing triggers, and coping with stressors for 5 out of 7 days for 60 days."  Interventions: Therapist utilized CBT and Solution focused brief therapy to address anxiety. Therapist provided support and empathy to patient during session. Therapist explored patient's frustrations with her mother in law and how she plans to deal with her going forward.   Effectiveness:  Patient was oriented x5 (person, place, situation, time, and object). Patient was casually dressed, and appropriately groomed. Patient was alert, engaged, pleasant, and cooperative. Patient reported that she and her family are sick. Patient got married. She had a good day but also noted that her mother in law continue to show her true colors. Her mother in law was asked to look after patient's twins while her husband to be got ready for the wedding. Patient said her mother in law did not look after her children and put them in the room, shut the door, and let them cry. Her husband had to get dressed and look after the twins as well. Patient was frustrated at this. She feels like her mother in law has shown how disinterested in spending time with the twins and this was further evidence of it. Patient is going to follow her husbands lead on interacting with her mother in law but she has no  interest in having a relationship. Patient is also trying to find extra work/money. She applied for a job with another [employer] and didn't get it. She was frustrated but got good feedback from the interviewer.    Patient engaged in session. Patient responded well to interventions. Patient continues to meet criteria for Generalized Anxiety Disorder. Patient will continue in outpatient therapy due to being the least restrictive service to meet her needs. Patient made moderate progress on her goals.   Suicidal/Homicidal: Negativewithout intent/plan  Plan: Return again in 2-4 weeks  Diagnosis: Axis I: Generalized Anxiety Disorder    Axis II: No diagnosis   I discussed the assessment and treatment plan with the patient. The patient was provided an opportunity to ask questions and all were answered. The patient agreed with the plan and demonstrated an understanding of the instructions.   The patient was advised to call back or seek an in-person evaluation if the symptoms worsen or if the condition fails to improve as anticipated.  I provided 45 minutes of non-face-to-face time during this encounter.   [name], LCSW 11/22/2020

## 2020-11-28 ENCOUNTER — Ambulatory Visit (INDEPENDENT_AMBULATORY_CARE_PROVIDER_SITE_OTHER): Payer: Medicaid Other | Admitting: Licensed Clinical Social Worker

## 2020-11-28 DIAGNOSIS — F411 Generalized anxiety disorder: Secondary | ICD-10-CM | POA: Diagnosis not present

## 2020-11-29 NOTE — Progress Notes (Signed)
Virtual Visit via Video Note  I connected with Amber Wilson on 11/29/20 at  2:00 PM EDT by a video enabled telemedicine application and verified that I am speaking with the correct person using two identifiers.  Location: Patient: Home Provider: Office   I discussed the limitations of evaluation and management by telemedicine and the availability of in person appointments. The patient expressed understanding and agreed to proceed.   THERAPIST PROGRESS NOTE  Session Time: 2:00 pm-2:30 pm  Type of Therapy: Individual Therapy   Purpose of Session/Treatment Goals addressed: "Amber Wilson will manage anxiety as evidenced by reducing panic attacks, managing triggers, and coping with stressors for 5 out of 7 days for 60 days."  Interventions: Therapist utilized CBT and Solution focused brief therapy to address anxiety. Therapist provided support and empathy to patient. Therapist processed patient's feelings to identify triggers for mood. Therapist validated patient's feelings.   Effectiveness:  Patient was oriented x5 (person, place, situation, time, and object). Patient was casually dressed, and appropriately groomed. Patient was alert, engaged, pleasant, and cooperative. Patient has been stressed over trying to find a new job. She has several interviews lined up and is trying to find the best one that meets her needs. Patient also feels like others are telling her she is irritable and takes it out on them. Patient doesn't see this but does recognize she is irritable. She spends all day with her twins, even at work, and gets exhausted at times. Patient feels like others don't understand that she is frustrated and she is not upset with them. Patient understood that her feelings are valid.     Patient engaged in session. Patient responded well to interventions. Patient continues to meet criteria for Generalized Anxiety Disorder. Patient will continue in outpatient therapy due to being the least  restrictive service to meet her needs. Patient made moderate progress on her goals.   Suicidal/Homicidal: Negativewithout intent/plan  Plan: Return again in 2-4 weeks  Diagnosis: Axis I: Generalized Anxiety Disorder    Axis II: No diagnosis   I discussed the assessment and treatment plan with the patient. The patient was provided an opportunity to ask questions and all were answered. The patient agreed with the plan and demonstrated an understanding of the instructions.   The patient was advised to call back or seek an in-person evaluation if the symptoms worsen or if the condition fails to improve as anticipated.  I provided 30 minutes of non-face-to-face time during this encounter.   [name], LCSW 11/29/2020

## 2020-12-10 ENCOUNTER — Ambulatory Visit (HOSPITAL_COMMUNITY): Payer: Medicaid Other | Admitting: Licensed Clinical Social Worker

## 2020-12-14 ENCOUNTER — Ambulatory Visit (INDEPENDENT_AMBULATORY_CARE_PROVIDER_SITE_OTHER): Payer: Medicaid Other | Admitting: Licensed Clinical Social Worker

## 2020-12-14 DIAGNOSIS — F411 Generalized anxiety disorder: Secondary | ICD-10-CM

## 2020-12-15 NOTE — Progress Notes (Signed)
Virtual Visit via Video Note  I connected with Amber Wilson on 12/15/20 at  9:00 AM EDT by a video enabled telemedicine application and verified that I am speaking with the correct person using two identifiers.  Location: Patient: Home Provider: Office   I discussed the limitations of evaluation and management by telemedicine and the availability of in person appointments. The patient expressed understanding and agreed to proceed.   THERAPIST PROGRESS NOTE  Session Time: 9:00 am-9:45 am  Type of Therapy: Individual Therapy   Purpose of Session/Treatment Goals addressed: "Wilson will manage anxiety as evidenced by reducing panic attacks, managing triggers, and coping with stressors for 5 out of 7 days for 60 days."  Interventions: Therapist utilized CBT and Solution focused brief therapy to address anxiety. Therapist provided support and empathy to patient during session. Therapist allowed space for patient to share her feelings. Therapist had patient verbalize her triggers for mood. Therapist worked with patient to break down her overwhelming tasks into smaller manageable tasks to reduce anxiety.   Effectiveness: Patient was oriented x5 (person, place, situation, time, and object). Patient was casually dressed, and appropriately groomed. Patient was alert, engaged, pleasant, and cooperative. Patient continues to feel overwhelmed and tired. She feels like nothing is going as it should. Her home is not in order, and her jobs she applied to have not worked out because they don't have childcare for her twins. Patient understood that she needs to adjust her expectations and plan on breaking down the cleaning into manageable pieces. She is planning on working on doing her dishes, and doing some laundry. She feels like that is low hanging fruit that will make a difference for her. Patient is also going to try to get some time to herself.    Patient engaged in session. Patient responded well to  interventions. Patient continues to meet criteria for Generalized Anxiety Disorder. Patient will continue in outpatient therapy due to being the least restrictive service to meet her needs. Patient made moderate progress on her goals.   Suicidal/Homicidal: Negativewithout intent/plan  Plan: Return again in 2-4 weeks  Diagnosis: Axis I: Generalized Anxiety Disorder    Axis II: No diagnosis   I discussed the assessment and treatment plan with the patient. The patient was provided an opportunity to ask questions and all were answered. The patient agreed with the plan and demonstrated an understanding of the instructions.   The patient was advised to call back or seek an in-person evaluation if the symptoms worsen or if the condition fails to improve as anticipated.  I provided 40 minutes of non-face-to-face time during this encounter.   [name], LCSW 12/15/2020

## 2020-12-17 NOTE — Progress Notes (Deleted)
GYNECOLOGY ANNUAL PHYSICAL EXAM PROGRESS NOTE  Subjective:    Amber Wilson is a 25 y.o. G67P0000 female who presents for an annual exam. The patient has no complaints today. The patient {is/is not/has never been:13135} sexually active. The patient participates in regular exercise: {yes/no/not asked:9010}. Has the patient ever been transfused or tattooed?: {yes/no/not asked:9010}. The patient reports that there {is/is not:9024} domestic violence in her life.    Menstrual History: Menarche age: *** No LMP recorded.     Gynecologic History:  Contraception: {method:5051} History of STI's:  Last Pap: 09/19/2016. Results were: normal.  ***Denies/Notes h/o abnormal pap smears. Last mammogram: Not age appropriate      OB History  Gravida Para Term Preterm AB Living  1 0 0 0 0 0  SAB IAB Ectopic Multiple Live Births  0 0 0 0 0    # Outcome Date GA Lbr Len/2nd Weight Sex Delivery Anes PTL Lv  1 Gravida             Past Medical History:  Diagnosis Date   Allergy    Anxiety    Depression    GERD (gastroesophageal reflux disease)     Past Surgical History:  Procedure Laterality Date   CESAREAN SECTION  04/18/2020   Procedure: CESAREAN SECTION;  Surgeon: Rubie Maid, MD;  Location: [facility] ORS;  Service: Obstetrics;;   TONSILLECTOMY     WISDOM TOOTH EXTRACTION      Family History  Problem Relation Age of Onset   Healthy Mother    Healthy Father    Healthy Maternal Grandmother    Healthy Maternal Grandfather    Healthy Paternal Grandmother    Healthy Paternal Grandfather     Social History   Socioeconomic History   Marital status: Single    Spouse name: Not on file   Number of children: Not on file   Years of education: Not on file   Highest education level: Not on file  Occupational History   Not on file  Tobacco Use   Smoking status: Former    Types: Cigarettes    Quit date: 02/15/2016    Years since quitting: 4.8   Smokeless tobacco: Never   Vaping Use   Vaping Use: Never used  Substance and Sexual Activity   Alcohol use: Never   Drug use: Not Currently    Frequency: 21.0 times per week    Types: Marijuana    Comment: 3-4 times per day   Sexual activity: Not Currently  Other Topics Concern   Not on file  Social History Narrative   Not on file   Social Determinants of Health   Financial Resource Strain: Not on file  Food Insecurity: Not on file  Transportation Needs: Not on file  Physical Activity: Not on file  Stress: Not on file  Social Connections: Not on file  Intimate Partner Violence: Not on file    Current Outpatient Medications on File Prior to Visit  Medication Sig Dispense Refill   busPIRone (BUSPAR) 15 MG tablet Take 1 tablet (15 mg total) by mouth 2 (two) times daily. 60 tablet 11   escitalopram (LEXAPRO) 10 MG tablet Take 1 tablet (10 mg total) by mouth daily. 30 tablet 11   ibuprofen (ADVIL) 600 MG tablet Take 1 tablet (600 mg total) by mouth every 6 (six) hours. 30 tablet 1   meclizine (ANTIVERT) 25 MG tablet Take 1 tablet (25 mg total) by mouth 3 (three) times daily as needed for dizziness.  30 tablet 0   norethindrone-ethinyl estradiol (JUNEL FE 1/20) 1-20 MG-MCG tablet Take 1 tablet by mouth daily. 84 tablet 3   ondansetron (ZOFRAN) 4 MG tablet Take 1 tablet (4 mg total) by mouth every 8 (eight) hours as needed for nausea or vomiting. 20 tablet 0   No current facility-administered medications on file prior to visit.    Allergies  Allergen Reactions   Penicillins    Pollen Extract      Review of Systems Constitutional: negative for chills, fatigue, fevers and sweats Eyes: negative for irritation, redness and visual disturbance Ears, nose, mouth, throat, and face: negative for hearing loss, nasal congestion, snoring and tinnitus Respiratory: negative for asthma, cough, sputum Cardiovascular: negative for chest pain, dyspnea, exertional chest pressure/discomfort, irregular heart beat,  palpitations and syncope Gastrointestinal: negative for abdominal pain, change in bowel habits, nausea and vomiting Genitourinary: negative for abnormal menstrual periods, genital lesions, sexual problems and vaginal discharge, dysuria and urinary incontinence Integument/breast: negative for breast lump, breast tenderness and nipple discharge Hematologic/lymphatic: negative for bleeding and easy bruising Musculoskeletal:negative for back pain and muscle weakness Neurological: negative for dizziness, headaches, vertigo and weakness Endocrine: negative for diabetic symptoms including polydipsia, polyuria and skin dryness Allergic/Immunologic: negative for hay fever and urticaria      Objective:  There were no vitals taken for this visit. There is no height or weight on file to calculate BMI.    General Appearance:    Alert, cooperative, no distress, appears stated age  Head:    Normocephalic, without obvious abnormality, atraumatic  Eyes:    PERRL, conjunctiva/corneas clear, EOM's intact, both eyes  Ears:    Normal external ear canals, both ears  Nose:   Nares normal, septum midline, mucosa normal, no drainage or sinus tenderness  Throat:   Lips, mucosa, and tongue normal; teeth and gums normal  Neck:   Supple, symmetrical, trachea midline, no adenopathy; thyroid: no enlargement/tenderness/nodules; no carotid bruit or JVD  Back:     Symmetric, no curvature, ROM normal, no CVA tenderness  Lungs:     Clear to auscultation bilaterally, respirations unlabored  Chest Wall:    No tenderness or deformity   Heart:    Regular rate and rhythm, S1 and S2 normal, no murmur, rub or gallop  Breast Exam:    No tenderness, masses, or nipple abnormality  Abdomen:     Soft, non-tender, bowel sounds active all four quadrants, no masses, no organomegaly.    Genitalia:    Pelvic:external genitalia normal, vagina without lesions, discharge, or tenderness, rectovaginal septum  normal. Cervix normal in appearance,  no cervical motion tenderness, no adnexal masses or tenderness.  Uterus normal size, shape, mobile, regular contours, nontender.  Rectal:    Normal external sphincter.  No hemorrhoids appreciated. Internal exam not done.   Extremities:   Extremities normal, atraumatic, no cyanosis or edema  Pulses:   2+ and symmetric all extremities  Skin:   Skin color, texture, turgor normal, no rashes or lesions  Lymph nodes:   Cervical, supraclavicular, and axillary nodes normal  Neurologic:   CNII-XII intact, normal strength, sensation and reflexes throughout   .  Labs:  Lab Results  Component Value Date   WBC 15.3 (H) 04/19/2020   HGB 10.6 (L) 06/14/2020   HCT 33.8 (L) 06/14/2020   MCV 83.1 04/19/2020   PLT 176 04/19/2020    Lab Results  Component Value Date   CREATININE 0.71 07/15/2018   BUN 11 07/15/2018   NA 139  07/15/2018   K 3.6 07/15/2018   CL 102 07/15/2018   CO2 21 07/15/2018    Lab Results  Component Value Date   ALT 16 07/15/2018   AST 20 07/15/2018   ALKPHOS 56 07/15/2018   BILITOT 0.4 07/15/2018    Lab Results  Component Value Date   TSH 2.960 07/15/2018     Assessment:   No diagnosis found.   Plan:  Blood tests: {blood tests:13147}. Breast self exam technique reviewed and patient encouraged to perform self-exam monthly. Contraception: {contraceptive methods:5051}. Discussed healthy lifestyle modifications. Mammogram  Not age appropriate Pap smear ordered. COVID vaccination status: UTD Follow up in 1 year for annual exam  Rubie Maid, MD [facility]

## 2020-12-18 ENCOUNTER — Encounter: Payer: Medicaid Other | Admitting: Obstetrics and Gynecology

## 2020-12-18 DIAGNOSIS — Z124 Encounter for screening for malignant neoplasm of cervix: Secondary | ICD-10-CM

## 2020-12-18 DIAGNOSIS — Z01419 Encounter for gynecological examination (general) (routine) without abnormal findings: Secondary | ICD-10-CM

## 2020-12-19 ENCOUNTER — Encounter: Payer: Self-pay | Admitting: Obstetrics and Gynecology

## 2020-12-24 ENCOUNTER — Ambulatory Visit (INDEPENDENT_AMBULATORY_CARE_PROVIDER_SITE_OTHER): Payer: Medicaid Other | Admitting: Licensed Clinical Social Worker

## 2020-12-24 DIAGNOSIS — F411 Generalized anxiety disorder: Secondary | ICD-10-CM | POA: Diagnosis not present

## 2020-12-25 NOTE — Progress Notes (Signed)
Virtual Visit via Video Note  I connected with Amber Wilson on 12/25/20 at  1:00 PM EDT by a video enabled telemedicine application and verified that I am speaking with the correct person using two identifiers.  Location: Patient: Home Provider: Office   I discussed the limitations of evaluation and management by telemedicine and the availability of in person appointments. The patient expressed understanding and agreed to proceed.   THERAPIST PROGRESS NOTE  Session Time: 1:00 pm-1:45 pm  Type of Therapy: Individual Therapy   Purpose of Session/Treatment Goals addressed: "Danella will manage anxiety as evidenced by reducing panic attacks, managing triggers, and coping with stressors for 5 out of 7 days for 60 days."  Interventions: Therapist utilized CBT and Solution focused brief therapy to address anxiety. Therapist provided support and empathy to patient during session. Therapist had patient identify pre-session change. Therapist worked with patient to identify ways to pay for her medication.   Effectiveness: Patient was oriented x5 (person, place, situation, time, and object). Patient was casually dressed, and appropriately groomed. Patient was alert, engaged, pleasant, and cooperative. Patient noted that things have improved slightly. She cleaned up her home. Patient continues to look for jobs. She is getting offers but is not taking them because they either can't take both her twins in childcare or don't offer her enough of a discount. She feels like she would be working just to cover childcare. Patient has not taken her medication. She said that her pharmacy is suddenly saying she has an additional insurance, one that she has not been on in years. Patient said the cost of her medicine is almost $75. Patient is trying to work with her insurance, and pharmacy to get her medicine. Patient was provided information on cheaper medication through [facility].    Patient engaged in session.  Patient responded well to interventions. Patient continues to meet criteria for Generalized Anxiety Disorder. Patient will continue in outpatient therapy due to being the least restrictive service to meet her needs. Patient made moderate progress on her goals.   Suicidal/Homicidal: Negativewithout intent/plan  Plan: Return again in 2-4 weeks  Diagnosis: Axis I: Generalized Anxiety Disorder    Axis II: No diagnosis   I discussed the assessment and treatment plan with the patient. The patient was provided an opportunity to ask questions and all were answered. The patient agreed with the plan and demonstrated an understanding of the instructions.   The patient was advised to call back or seek an in-person evaluation if the symptoms worsen or if the condition fails to improve as anticipated.  I provided 45 minutes of non-face-to-face time during this encounter.   [name], LCSW 12/25/2020

## 2021-01-04 ENCOUNTER — Ambulatory Visit (HOSPITAL_COMMUNITY): Payer: Medicaid Other | Admitting: Licensed Clinical Social Worker

## 2021-01-22 ENCOUNTER — Ambulatory Visit
Admission: RE | Admit: 2021-01-22 | Discharge status: Absent without leave | Payer: Medicaid Other | Source: Ambulatory Visit

## 2021-01-22 ENCOUNTER — Other Ambulatory Visit: Payer: Self-pay

## 2021-01-29 DIAGNOSIS — Z111 Encounter for screening for respiratory tuberculosis: Secondary | ICD-10-CM | POA: Diagnosis not present

## 2021-01-29 DIAGNOSIS — Z021 Encounter for pre-employment examination: Secondary | ICD-10-CM | POA: Diagnosis not present

## 2021-02-07 ENCOUNTER — Ambulatory Visit (HOSPITAL_COMMUNITY): Payer: Medicaid Other | Admitting: Licensed Clinical Social Worker

## 2021-02-12 ENCOUNTER — Ambulatory Visit (INDEPENDENT_AMBULATORY_CARE_PROVIDER_SITE_OTHER): Payer: Medicaid Other | Admitting: Licensed Clinical Social Worker

## 2021-02-12 DIAGNOSIS — F411 Generalized anxiety disorder: Secondary | ICD-10-CM | POA: Diagnosis not present

## 2021-02-13 NOTE — Progress Notes (Signed)
Virtual Visit via Video Note  I connected with Amber Wilson on 02/13/21 at 11:00 AM EST by a video enabled telemedicine application and verified that I am speaking with the correct person using two identifiers.  Location: Patient: Home Provider: Office   I discussed the limitations of evaluation and management by telemedicine and the availability of in person appointments. The patient expressed understanding and agreed to proceed.   THERAPIST PROGRESS NOTE  Session Time: 11:00 am-11:20 am  Type of Therapy: Individual Therapy   Purpose of Session/Treatment Goals addressed: "Amber Wilson will manage anxiety as evidenced by reducing panic attacks, managing triggers, and coping with stressors for 5 out of 7 days for 60 days."  Interventions: Therapist utilized CBT and Solution focused brief therapy to address anxiety. Therapist provided support and empathy to patient during session. Therapist explored patient's feelings about her job. Therapist helped patient to identify the "workable" parts of her frustrations and encouraged patient to speak up at her job.   Effectiveness: Patient was oriented x5 (person, place, situation, time, and object). Patient was casually dressed, and appropriately groomed. Patient was alert, engaged, pleasant, and cooperative. Patient has started a new job and she feels like she is being taken advantage of a little. She is scheduled until a certain time and usually has to wait half an hour or so until she is able to leave. Patient is nervous about speaking up but sees the importance of it. She didn't want to seem like the new employee who complains but also doesn't want to give the message that she is ok with staying beyond her time. She has her twins there and a 40 min drive home once she gets off. Patient likes her job but wants to get this resolved.    Patient engaged in session. Patient responded well to interventions. Patient continues to meet criteria for Generalized  Anxiety Disorder. Patient will continue in outpatient therapy due to being the least restrictive service to meet her needs. Patient made moderate progress on her goals.   Suicidal/Homicidal: Negativewithout intent/plan  Plan: Return again in 2-4 weeks  Diagnosis: Axis I: Generalized Anxiety Disorder    Axis II: No diagnosis   I discussed the assessment and treatment plan with the patient. The patient was provided an opportunity to ask questions and all were answered. The patient agreed with the plan and demonstrated an understanding of the instructions.   The patient was advised to call back or seek an in-person evaluation if the symptoms worsen or if the condition fails to improve as anticipated.  I provided 20 minutes of non-face-to-face time during this encounter.   Amber Bickers, LCSW 02/13/2021

## 2021-02-18 DIAGNOSIS — J069 Acute upper respiratory infection, unspecified: Secondary | ICD-10-CM | POA: Diagnosis not present

## 2021-02-18 DIAGNOSIS — Z20822 Contact with and (suspected) exposure to covid-19: Secondary | ICD-10-CM | POA: Diagnosis not present

## 2021-02-18 DIAGNOSIS — R059 Cough, unspecified: Secondary | ICD-10-CM | POA: Diagnosis not present

## 2021-02-21 ENCOUNTER — Telehealth (INDEPENDENT_AMBULATORY_CARE_PROVIDER_SITE_OTHER): Payer: Medicaid Other | Admitting: Family Medicine

## 2021-02-21 ENCOUNTER — Other Ambulatory Visit: Payer: Self-pay

## 2021-02-21 ENCOUNTER — Encounter: Payer: Self-pay | Admitting: Family Medicine

## 2021-02-21 VITALS — Temp 98.7°F | Wt 125.0 lb

## 2021-02-21 DIAGNOSIS — R509 Fever, unspecified: Secondary | ICD-10-CM | POA: Diagnosis not present

## 2021-02-21 DIAGNOSIS — Z20822 Contact with and (suspected) exposure to covid-19: Secondary | ICD-10-CM

## 2021-02-21 DIAGNOSIS — J989 Respiratory disorder, unspecified: Secondary | ICD-10-CM | POA: Diagnosis not present

## 2021-02-21 DIAGNOSIS — H9203 Otalgia, bilateral: Secondary | ICD-10-CM | POA: Diagnosis not present

## 2021-02-21 DIAGNOSIS — R5381 Other malaise: Secondary | ICD-10-CM

## 2021-02-21 DIAGNOSIS — R599 Enlarged lymph nodes, unspecified: Secondary | ICD-10-CM

## 2021-02-21 DIAGNOSIS — R5383 Other fatigue: Secondary | ICD-10-CM | POA: Diagnosis not present

## 2021-02-21 DIAGNOSIS — J029 Acute pharyngitis, unspecified: Secondary | ICD-10-CM | POA: Diagnosis not present

## 2021-02-21 MED ORDER — PREDNISONE 20 MG PO TABS: 20.0000 mg | ORAL_TABLET | Freq: Every day | ORAL | 0 refills | Status: DC

## 2021-02-21 MED ORDER — GUAIFENESIN-CODEINE 100-10 MG/5ML PO SOLN: 10.0000 mL | Freq: Every evening | ORAL | 0 refills | Status: DC | PRN

## 2021-02-21 MED ORDER — GUAIFENESIN-DM 100-10 MG/5ML PO SYRP: 10.0000 mL | ORAL_SOLUTION | ORAL | 1 refills | Status: DC | PRN

## 2021-02-21 MED ORDER — AZITHROMYCIN 250 MG PO TABS: ORAL_TABLET | ORAL | 0 refills | Status: AC

## 2021-02-21 MED ORDER — DOXYCYCLINE HYCLATE 100 MG PO TABS: 100.0000 mg | ORAL_TABLET | Freq: Two times a day (BID) | ORAL | 0 refills | Status: DC

## 2021-02-21 NOTE — Assessment & Plan Note (Signed)
Generalized sore throat Denies strep exposure Hurts to talk due to swollen glands

## 2021-02-21 NOTE — Progress Notes (Signed)
MyChart Video Visit    Virtual Visit via Video Note   This visit type was conducted due to national recommendations for restrictions regarding the COVID-19 Pandemic (e.g. social distancing) in an effort to limit this patient's exposure and mitigate transmission in our community. This patient is at least at moderate risk for complications without adequate follow up. This format is felt to be most appropriate for this patient at this time. Physical exam was limited by quality of the video and audio technology used for the visit.   Patient location: home  Provider location: [facility]  I discussed the limitations of evaluation and management by telemedicine and the availability of in person appointments. The patient expressed understanding and agreed to proceed.  Patient: Amber Wilson   DOB: Jun 01, 1995   25 y.o. Female  MRN: [id number] Visit Date: 02/21/2021  Today's healthcare provider: Gwyneth Sprout, FNP   Chief Complaint  Patient presents with   Fever   Subjective    Fever  This is a new problem. The current episode started yesterday. The problem has been gradually improving. The maximum temperature noted was 102 to 102.9 F. The temperature was taken using an oral thermometer. Associated symptoms include congestion, coughing, ear pain, headaches, muscle aches, sleepiness and a sore throat. Pertinent negatives include no abdominal pain, chest pain, diarrhea, nausea, rash, urinary pain, vomiting or wheezing. She has tried acetaminophen for the symptoms. The treatment provided mild relief.  Risk factors: sick contacts (patients children have virus)   Risk factors: no contaminated food, no contaminated water, no hx of cancer, no immunosuppression, no occupational exposure, no recent sickness and no recent travel     Negative for flu, Covid, RSV.  Medications: Outpatient Medications Prior to Visit  Medication Sig   busPIRone (BUSPAR) 15 MG tablet Take 1 tablet (15 mg total) by mouth 2  (two) times daily.   escitalopram (LEXAPRO) 10 MG tablet Take 1 tablet (10 mg total) by mouth daily.   ibuprofen (ADVIL) 600 MG tablet Take 1 tablet (600 mg total) by mouth every 6 (six) hours.   meclizine (ANTIVERT) 25 MG tablet Take 1 tablet (25 mg total) by mouth 3 (three) times daily as needed for dizziness.   norethindrone-ethinyl estradiol (JUNEL FE 1/20) 1-20 MG-MCG tablet Take 1 tablet by mouth daily.   ondansetron (ZOFRAN) 4 MG tablet Take 1 tablet (4 mg total) by mouth every 8 (eight) hours as needed for nausea or vomiting.   No facility-administered medications prior to visit.    Review of Systems  Constitutional:  Positive for fever.  HENT:  Positive for congestion, ear pain and sore throat.   Respiratory:  Positive for cough. Negative for wheezing.   Cardiovascular:  Negative for chest pain.  Gastrointestinal:  Negative for abdominal pain, diarrhea, nausea and vomiting.  Genitourinary:  Negative for dysuria.  Skin:  Negative for rash.  Neurological:  Positive for headaches.     Objective    Temp 98.7 F (37.1 C) (Oral)    Wt 125 lb (56.7 kg)    BMI 22.14 kg/m    Physical Exam Constitutional:      General: She is not in acute distress.    Appearance: Normal appearance. She is not ill-appearing or toxic-appearing.  Pulmonary:     Effort: Pulmonary effort is normal.  Neurological:     General: No focal deficit present.     Mental Status: She is alert.  Psychiatric:        Mood and  Affect: Mood normal.        Behavior: Behavior normal.        Thought Content: Thought content normal.        Judgment: Judgment normal.       Assessment & Plan     Problem List Items Addressed This Visit       Respiratory   Febrile respiratory illness - Primary    Fever in 102s Sick contacts for 1 week exposure Other associated symptoms Fever has improved since Tmax of 102.9      Pharyngitis    Generalized sore throat Denies strep exposure Hurts to talk due to swollen  glands        Nervous and Auditory   Acute ear pain, bilateral    Due to swollen glands Acute pain for 1 week, with other associated symptoms  Has 66 month old twins        Immune and Lymphatic   Glands swollen    From ears to chin        Other   Malaise and fatigue    Ongoing URI with multiple systems Mom of sick kids Likely multi focal      Exposure to 2019 novel coronavirus     No follow-ups on file.     I discussed the assessment and treatment plan with the patient. The patient was provided an opportunity to ask questions and all were answered. The patient agreed with the plan and demonstrated an understanding of the instructions.   The patient was advised to call back or seek an in-person evaluation if the symptoms worsen or if the condition fails to improve as anticipated.  I provided 6 minutes of non-face-to-face time during this encounter.  Vonna Kotyk, FNP, have reviewed all documentation for this visit. The documentation on 02/21/21 for the exam, diagnosis, procedures, and orders are all accurate and complete.   Gwyneth Sprout, [place] [phone] (phone) [phone] (fax)  [place]

## 2021-02-21 NOTE — Assessment & Plan Note (Signed)
Ongoing URI with multiple systems Mom of sick kids Likely multi focal

## 2021-02-21 NOTE — Assessment & Plan Note (Signed)
From ears to chin

## 2021-02-21 NOTE — Assessment & Plan Note (Signed)
Due to swollen glands Acute pain for 1 week, with other associated symptoms  Has 64 month old twins

## 2021-02-21 NOTE — Assessment & Plan Note (Signed)
Fever in 102s Sick contacts for 1 week exposure Other associated symptoms Fever has improved since Tmax of 102.9

## 2021-03-01 ENCOUNTER — Ambulatory Visit (INDEPENDENT_AMBULATORY_CARE_PROVIDER_SITE_OTHER): Payer: Medicaid Other | Admitting: Licensed Clinical Social Worker

## 2021-03-01 DIAGNOSIS — F411 Generalized anxiety disorder: Secondary | ICD-10-CM | POA: Diagnosis not present

## 2021-03-02 NOTE — Progress Notes (Signed)
Virtual Visit via Video Note  I connected with Amber Wilson on 03/02/21 at 11:00 AM EST by a video enabled telemedicine application and verified that I am speaking with the correct person using two identifiers.  Location: Patient: Home Provider: Office   I discussed the limitations of evaluation and management by telemedicine and the availability of in person appointments. The patient expressed understanding and agreed to proceed.   THERAPIST PROGRESS NOTE  Session Time: 11:00 am-11:30 am  Type of Therapy: Individual Therapy   Purpose of Session/Treatment Goals addressed: "[name] will manage anxiety as evidenced by reducing panic attacks, managing triggers, and coping with stressors for 5 out of 7 days for 60 days."  Interventions: Therapist utilized CBT and Solution focused brief therapy to address anxiety. Therapist provided support and empathy to patient during session. Therapist explored patient's triggers for anxiety. Therapist worked with patient to identify small steps to regulate her physical health which impacts her anxiety.    Effectiveness: Patient was oriented x5 (person, place, situation, time, and object). Patient was casually dressed, and appropriately groomed. Patient was tired, engaged, pleasant, and cooperative. Patient noted that sickness has gone through their household including pinkeye, and another sickness. Patient has not been sleeping well or eating well. Patient has visibly lost weight. Patient has been focused on her twins and attending to their needs. Patient has set her needs on the backburner for now. Patient is trying to focus on eating, sleeping, and getting a refill on her medication. Patient is taking life day by day. Patient spoke up at work and got her schedule changed.    Patient engaged in session. Patient responded well to interventions. Patient continues to meet criteria for Generalized Anxiety Disorder. Patient will continue in outpatient therapy due  to being the least restrictive service to meet her needs. Patient made moderate progress on her goals.   Suicidal/Homicidal: Negativewithout intent/plan  Plan: Return again in 2-4 weeks  Diagnosis: Axis I: Generalized Anxiety Disorder    Axis II: No diagnosis   I discussed the assessment and treatment plan with the patient. The patient was provided an opportunity to ask questions and all were answered. The patient agreed with the plan and demonstrated an understanding of the instructions.   The patient was advised to call back or seek an in-person evaluation if the symptoms worsen or if the condition fails to improve as anticipated.  I provided 30 minutes of non-face-to-face time during this encounter.   [name], LCSW 03/02/2021

## 2021-03-14 ENCOUNTER — Ambulatory Visit (HOSPITAL_COMMUNITY): Payer: Medicaid Other | Admitting: Licensed Clinical Social Worker

## 2021-05-07 ENCOUNTER — Telehealth: Payer: Self-pay | Admitting: Obstetrics and Gynecology

## 2021-05-07 NOTE — Telephone Encounter (Signed)
Called patient. She needed proof of TDAP. I left her immunization report at front desk for pick up. ?

## 2021-05-07 NOTE — Telephone Encounter (Signed)
Pt called asking for documentation of TDAP, please advise.  ?

## 2021-08-05 ENCOUNTER — Ambulatory Visit (INDEPENDENT_AMBULATORY_CARE_PROVIDER_SITE_OTHER): Payer: Medicaid Other | Admitting: Licensed Clinical Social Worker

## 2021-08-05 DIAGNOSIS — F411 Generalized anxiety disorder: Secondary | ICD-10-CM | POA: Diagnosis not present

## 2021-08-05 NOTE — Progress Notes (Signed)
  THERAPIST PROGRESS NOTE  Session Time: 11:00 am-11:45 am  Type of Therapy: Individual Therapy   Purpose of Session/Treatment Goals addressed: "Amber Wilson will manage anxiety as evidenced by reducing panic attacks, managing triggers, and coping with stressors for 5 out of 7 days for 60 days."  Interventions: Therapist utilized CBT and Solution focused brief therapy to address anxiety. Therapist provided support and empathy to patient during session. Therapit explored patient's triggers for anxiety. Therapist processed patient's feelings related to her son's health.    Effectiveness: Patient was oriented x4 (person, place, situation, and time). Patient was casually dressed, and appropriately groomed. Patient was alert, engaged, stressed, and cooperative. Patient noted that one of her son's has constantly been sick. At first she thought it was because she worked and [employer]. Her twins were at the daycare as well. Patient said both boys got sick on a regular basis but her one son just seem to get sick much easier and couldn't shake sickness as easily. Patient had taken her son to the [facility] ER numerous times and was sent home. She took him to [facility] and he was admitted. His white blood cell was high. She had always felt there was something "wrong" with her son due to his fevers and feeling like he was in pain. He had pneumonia in one lung and sepsis in another lung. Patient noted her son was given an antibiotic and seems to feel a little better. Her son is immuno-compromised but he doesn't have a solid diagnosis. Without that diagnosis it is hard for her to get assistance/programs for her family. Patient explained that her son's symptoms could indicate cancer. She had thought about this prior to a doctor saying this. She feels like this helped with getting the news.  Patient, her husband, and her mother have missed work when her son has had sick episodes. It has hurt them financially. Patient has found a new job and  her husband is working around her schedule that way someone is available to be with her son if needed. Patient's anxiety has benefited her to be mindful and aware of her son's health. She has not been taking care of herself. She is not sleeping as well and has not been eating. She tries to "relax" on the weekend with more low key activities with her family.    Patient engaged in session. Patient responded well to interventions. Patient continues to meet criteria for Generalized Anxiety Disorder. Patient will continue in outpatient therapy due to being the least restrictive service to meet her needs. Patient made moderate progress on her goals.   Suicidal/Homicidal: Negativewithout intent/plan  Plan: Return again in 2-4 weeks. Patient will attend to herself while attending to the needs of her son(s).   Diagnosis: Axis I: Generalized Anxiety Disorder    Axis II: No diagnosis     [name], LCSW 08/05/2021

## 2021-08-20 ENCOUNTER — Ambulatory Visit (INDEPENDENT_AMBULATORY_CARE_PROVIDER_SITE_OTHER): Payer: Medicaid Other | Admitting: Licensed Clinical Social Worker

## 2021-08-20 DIAGNOSIS — F411 Generalized anxiety disorder: Secondary | ICD-10-CM

## 2021-08-21 NOTE — Progress Notes (Signed)
Virtual Visit via Video Note  I connected with Amber Wilson on 08/21/21 at  2:00 PM EDT by a video enabled telemedicine application and verified that I am speaking with the correct person using two identifiers.  Location: Patient: Home Provider: Office   I discussed the limitations of evaluation and management by telemedicine and the availability of in person appointments. The patient expressed understanding and agreed to proceed.  THERAPIST PROGRESS NOTE  Session Time: 2:00 pm-2:45 pm  Type of Therapy: Individual Therapy   Purpose of Session/Treatment Goals addressed: "Amber Wilson will manage anxiety as evidenced by reducing panic attacks, managing triggers, and coping with stressors for 5 out of 7 days for 60 days."  Interventions: Therapist utilized CBT and Solution focused brief therapy to address anxiety. Therapist provided support and empathy to patient during session. Therapist processed patients feelings to identify triggers for anxiety. Therapist worked with patient to reframe her anxious thoughts.   Effectiveness: Patient was oriented x4 (person, place, situation, and time). Patient was casually dressed, and appropriately groomed. Patient was alert, engaged, pleasant, and cooperative. Patient noted that things have been ok. She is experiencing moments of anxiety. Patient was able to identify that her anxiety is connected her experience with her son's in the NICU. When her children are upset and someone comes to comfort them or help her she views it as her child is being taken away like when they were in the hospital. Patient understands that some of her anxiety is illogical but this hits a nerve with her. It triggers her mom guilt. She feels like she has failed her children or did something wrong if someone walks away with her child. Patient is going to work on reframing her thoughts to others are being helpful not trying to take her child due to her not being competent.    Patient  engaged in session. Patient responded well to interventions. Patient continues to meet criteria for Generalized Anxiety Disorder. Patient will continue in outpatient therapy due to being the least restrictive service to meet her needs. Patient made moderate progress on her goals.   Suicidal/Homicidal: Negativewithout intent/plan  Plan: Return again in 2-4 weeks. Patient will reframe her thoughts about people trying to help her.    Diagnosis: Axis I: Generalized Anxiety Disorder    Axis II: No diagnosis   I discussed the assessment and treatment plan with the patient. The patient was provided an opportunity to ask questions and all were answered. The patient agreed with the plan and demonstrated an understanding of the instructions.   The patient was advised to call back or seek an in-person evaluation if the symptoms worsen or if the condition fails to improve as anticipated.  I provided 45 minutes of non-face-to-face time during this encounter.  [name], LCSW 08/21/2021

## 2021-10-04 ENCOUNTER — Ambulatory Visit: Payer: Medicaid Other

## 2021-10-04 ENCOUNTER — Ambulatory Visit: Payer: Medicaid Other | Admitting: Physician Assistant

## 2021-10-08 ENCOUNTER — Ambulatory Visit (HOSPITAL_COMMUNITY): Payer: Medicaid Other | Admitting: Licensed Clinical Social Worker

## 2021-10-08 ENCOUNTER — Encounter (HOSPITAL_COMMUNITY): Payer: Self-pay

## 2021-10-13 ENCOUNTER — Ambulatory Visit
Admission: RE | Admit: 2021-10-13 | Discharge: 2021-10-13 | Disposition: A | Discharge status: Home / Self Care | Payer: Medicaid Other | Source: Ambulatory Visit | Attending: Emergency Medicine | Admitting: Emergency Medicine

## 2021-10-13 VITALS — BP 90/64 | HR 89 | Temp 98.1°F | Resp 14 | Ht 63.0 in | Wt 115.0 lb

## 2021-10-13 DIAGNOSIS — J0100 Acute maxillary sinusitis, unspecified: Secondary | ICD-10-CM | POA: Diagnosis not present

## 2021-10-13 DIAGNOSIS — H6503 Acute serous otitis media, bilateral: Secondary | ICD-10-CM | POA: Diagnosis not present

## 2021-10-13 MED ORDER — BENZONATATE 100 MG PO CAPS: 200.0000 mg | ORAL_CAPSULE | Freq: Three times a day (TID) | ORAL | 0 refills | Status: DC

## 2021-10-13 MED ORDER — DOXYCYCLINE HYCLATE 100 MG PO CAPS: 100.0000 mg | ORAL_CAPSULE | Freq: Two times a day (BID) | ORAL | 0 refills | Status: DC

## 2021-10-13 MED ORDER — PROMETHAZINE-DM 6.25-15 MG/5ML PO SYRP: 5.0000 mL | ORAL_SOLUTION | Freq: Four times a day (QID) | ORAL | 0 refills | Status: DC | PRN

## 2021-10-13 MED ORDER — IPRATROPIUM BROMIDE 0.06 % NA SOLN: 2.0000 | Freq: Four times a day (QID) | NASAL | 12 refills | Status: DC

## 2021-10-13 NOTE — ED Provider Notes (Signed)
[facility]    CSN: [id number] Arrival date & time: 10/13/21  1359      History   Chief Complaint Chief Complaint  Patient presents with   Nasal Congestion   Sinus Problem    HPI Amber Wilson is a 26 y.o. female.   HPI  26 year old female here for evaluation of respiratory complaints.  Patient reports that for over a week she has been experiencing nasal congestion with sinus pressure.  She is having thick green nasal discharge from both nares.  She also has some ear pain, sore throat, and a nonproductive cough.  She does endorse some vertigo type symptoms when she lays down at night but not when she is up and moving.  She denies any fever, shortness of breath, wheezing, or GI complaints.  Past Medical History:  Diagnosis Date   Allergy    Anxiety    Depression    GERD (gastroesophageal reflux disease)     Patient Active Problem List   Diagnosis Date Noted   Febrile respiratory illness 02/21/2021   Acute ear pain, bilateral 02/21/2021   Malaise and fatigue 02/21/2021   Exposure to 2019 novel coronavirus 02/21/2021   Pharyngitis 02/21/2021   Glands swollen 02/21/2021    Past Surgical History:  Procedure Laterality Date   CESAREAN SECTION  04/18/2020   Procedure: CESAREAN SECTION;  Surgeon: Rubie Maid, MD;  Location: [facility] ORS;  Service: Obstetrics;;   TONSILLECTOMY     WISDOM TOOTH EXTRACTION      OB History     Gravida  1   Para  0   Term  0   Preterm  0   AB  0   Living  0      SAB  0   IAB  0   Ectopic  0   Multiple  0   Live Births  0            Home Medications    Prior to Admission medications   Medication Sig Start Date End Date Taking? Authorizing Provider  benzonatate (TESSALON) 100 MG capsule Take 2 capsules (200 mg total) by mouth every 8 (eight) hours. 10/13/21  Yes Margarette Canada, NP  doxycycline (VIBRAMYCIN) 100 MG capsule Take 1 capsule (100 mg total) by mouth 2 (two) times daily. 10/13/21  Yes Margarette Canada, NP  ipratropium (ATROVENT) 0.06 % nasal spray Place 2 sprays into both nostrils 4 (four) times daily. 10/13/21  Yes Margarette Canada, NP  promethazine-dextromethorphan (PROMETHAZINE-DM) 6.25-15 MG/5ML syrup Take 5 mLs by mouth 4 (four) times daily as needed. 10/13/21  Yes Margarette Canada, NP    Family History Family History  Problem Relation Age of Onset   Healthy Mother    Healthy Father    Healthy Maternal Grandmother    Healthy Maternal Grandfather    Healthy Paternal Grandmother    Healthy Paternal Grandfather     Social History Social History   Tobacco Use   Smoking status: Former    Types: Cigarettes    Quit date: 02/15/2016    Years since quitting: 5.6   Smokeless tobacco: Never  Vaping Use   Vaping Use: Never used  Substance Use Topics   Alcohol use: Never   Drug use: Not Currently    Frequency: 21.0 times per week    Types: Marijuana    Comment: 3-4 times per day     Allergies   Penicillins and Pollen extract   Review of Systems Review of Systems  Constitutional:  Negative for fever.  HENT:  Positive for congestion, ear pain, rhinorrhea, sinus pressure and sore throat.   Respiratory:  Positive for cough. Negative for shortness of breath and wheezing.   Neurological:  Positive for dizziness.  Hematological: Negative.   Psychiatric/Behavioral: Negative.       Physical Exam Triage Vital Signs ED Triage Vitals  Enc Vitals Group     BP 10/13/21 1410 90/64     Pulse Rate 10/13/21 1410 89     Resp 10/13/21 1410 14     Temp 10/13/21 1410 98.1 F (36.7 C)     Temp Source 10/13/21 1410 Oral     SpO2 10/13/21 1410 97 %     Weight 10/13/21 1407 115 lb (52.2 kg)     Height 10/13/21 1407 5' 3" (1.6 m)     Head Circumference --      Peak Flow --      Pain Score 10/13/21 1407 4     Pain Loc --      Pain Edu? --      Excl. in [place]? --    No data found.  Updated Vital Signs BP 90/64 (BP Location: Left Arm)   Pulse 89   Temp 98.1 F (36.7 C) (Oral)    Resp 14   Ht 5' 3" (1.6 m)   Wt 115 lb (52.2 kg)   LMP 10/11/2021 (Exact Date)   SpO2 97%   BMI 20.37 kg/m   Visual Acuity Right Eye Distance:   Left Eye Distance:   Bilateral Distance:    Right Eye Near:   Left Eye Near:    Bilateral Near:     Physical Exam Vitals and nursing note reviewed.  Constitutional:      Appearance: Normal appearance. She is not ill-appearing.  HENT:     Head: Normocephalic and atraumatic.     Right Ear: Ear canal and external ear normal. There is no impacted cerumen.     Left Ear: Ear canal and external ear normal. There is no impacted cerumen.     Nose: Congestion and rhinorrhea present.     Mouth/Throat:     Mouth: Mucous membranes are moist.     Pharynx: Oropharynx is clear. Posterior oropharyngeal erythema present. No oropharyngeal exudate.  Cardiovascular:     Rate and Rhythm: Normal rate and regular rhythm.     Pulses: Normal pulses.     Heart sounds: Normal heart sounds. No murmur heard.    No friction rub. No gallop.  Pulmonary:     Effort: Pulmonary effort is normal.     Breath sounds: Normal breath sounds. No wheezing, rhonchi or rales.  Musculoskeletal:     Cervical back: Normal range of motion and neck supple.  Lymphadenopathy:     Cervical: No cervical adenopathy.  Skin:    General: Skin is warm and dry.     Capillary Refill: Capillary refill takes less than 2 seconds.     Findings: No erythema or rash.  Neurological:     General: No focal deficit present.     Mental Status: She is alert and oriented to person, place, and time.  Psychiatric:        Mood and Affect: Mood normal.        Behavior: Behavior normal.        Thought Content: Thought content normal.        Judgment: Judgment normal.      UC Treatments / Results  Labs (all  labs ordered are listed, but only abnormal results are displayed) Labs Reviewed - No data to display  EKG   Radiology No results found.  Procedures Procedures (including critical  care time)  Medications Ordered in UC Medications - No data to display  Initial Impression / Assessment and Plan / UC Course  I have reviewed the triage vital signs and the nursing notes.  Pertinent labs & imaging results that were available during my care of the patient were reviewed by me and considered in my medical decision making (see chart for details).   Patient is a nontoxic-appearing 26 year old female here for evaluation of respiratory and sinus complaints as outlined in HPI above.  She reports that she has had symptoms for over a week.  Her physical exam reveals serous effusions behind both tympanic membranes with mild erythema to the rims of both TMs.  Both external auditory canals are clear.  Nasal mucosa shows marked edema and erythema with purulent discharge in both nares.  Patient does have tenderness to percussion of bilateral maxillary sinuses.  Oropharyngeal exam reveals posterior oropharyngeal erythema with postnasal drip.  No cervical lymphadenopathy appreciable exam.  Cardiopulmonary exam reveals S1-S2 heart sounds with regular rate and rhythm and lung sounds that are clear to auscultation all fields.  Patient exam is consistent with maxillary sinusitis and serous otitis media bilaterally.  She has not allergy to penicillin which causes anaphylaxis so I will treat her with doxycycline twice daily for 10 days.  Also Atrovent nasal spray, Tessalon Perles, and Promethazine DM cough syrup.  Return precautions reviewed.   Final Clinical Impressions(s) / UC Diagnoses   Final diagnoses:  Acute non-recurrent maxillary sinusitis  Non-recurrent acute serous otitis media of both ears     Discharge Instructions      The Doxycyline twice daily with food for 10 days for treatment of your sinusitis.  Perform sinus irrigation 2-3 times a day with a NeilMed sinus rinse kit and distilled water.  Do not use tap water.  You can use plain over-the-counter Mucinex every 6 hours to break  up the stickiness of the mucus so your body can clear it.  Increase your oral fluid intake to thin out your mucus so that is also able for your body to clear more easily.  Take an over-the-counter probiotic, such as Culturelle-align-activia, 1 hour after each dose of antibiotic to prevent diarrhea.  Use the Atrovent nasal spray, 2 squirts in each nostril every 6 hours, as needed for runny nose and postnasal drip.  Use the Tessalon Perles every 8 hours during the day.  Take them with a small sip of water.  They may give you some numbness to the base of your tongue or a metallic taste in your mouth, this is normal.  Use the Promethazine DM cough syrup at bedtime for cough and congestion.  It will make you drowsy so do not take it during the day.  If you develop any new or worsening symptoms return for reevaluation or see your primary care provider.      ED Prescriptions     Medication Sig Dispense Auth. Provider   doxycycline (VIBRAMYCIN) 100 MG capsule Take 1 capsule (100 mg total) by mouth 2 (two) times daily. 20 capsule Margarette Canada, NP   ipratropium (ATROVENT) 0.06 % nasal spray Place 2 sprays into both nostrils 4 (four) times daily. 15 mL Margarette Canada, NP   benzonatate (TESSALON) 100 MG capsule Take 2 capsules (200 mg total) by mouth every 8 (  eight) hours. 21 capsule Margarette Canada, NP   promethazine-dextromethorphan (PROMETHAZINE-DM) 6.25-15 MG/5ML syrup Take 5 mLs by mouth 4 (four) times daily as needed. 118 mL Margarette Canada, NP      PDMP not reviewed this encounter.   Margarette Canada, NP 10/13/21 1424

## 2021-10-13 NOTE — Discharge Instructions (Signed)
The Doxycyline twice daily with food for 10 days for treatment of your sinusitis.  Perform sinus irrigation 2-3 times a day with a NeilMed sinus rinse kit and distilled water.  Do not use tap water.  You can use plain over-the-counter Mucinex every 6 hours to break up the stickiness of the mucus so your body can clear it.  Increase your oral fluid intake to thin out your mucus so that is also able for your body to clear more easily.  Take an over-the-counter probiotic, such as Culturelle-align-activia, 1 hour after each dose of antibiotic to prevent diarrhea.  Use the Atrovent nasal spray, 2 squirts in each nostril every 6 hours, as needed for runny nose and postnasal drip.  Use the Tessalon Perles every 8 hours during the day.  Take them with a small sip of water.  They may give you some numbness to the base of your tongue or a metallic taste in your mouth, this is normal.  Use the Promethazine DM cough syrup at bedtime for cough and congestion.  It will make you drowsy so do not take it during the day.  If you develop any new or worsening symptoms return for reevaluation or see your primary care provider.

## 2021-10-13 NOTE — ED Triage Notes (Signed)
Patient c/o cough, nasal congestion, sinus pain and pressure and low grade fever that started over a week ago.

## 2021-10-22 ENCOUNTER — Ambulatory Visit (INDEPENDENT_AMBULATORY_CARE_PROVIDER_SITE_OTHER): Payer: Medicaid Other | Admitting: Licensed Clinical Social Worker

## 2021-10-22 DIAGNOSIS — F411 Generalized anxiety disorder: Secondary | ICD-10-CM
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
## 2021-11-12 NOTE — Progress Notes (Unsigned)
    GYNECOLOGY PROGRESS NOTE  Subjective:    Patient ID: HALEIGH DESMITH, female    DOB: August 10, 1995, 26 y.o.   MRN: [id number]  HPI  Patient is a 26 y.o. G60P0000 female who presents for evaluation for vaginal concerns. She has vaginal irritation, swelling and pain.  {Common ambulatory SmartLinks:19316}  Review of Systems {ros; complete:30496}   Objective:   There were no vitals taken for this visit. There is no height or weight on file to calculate BMI. General appearance: {general exam:16600} Abdomen: {abdominal exam:16834} Pelvic: {pelvic exam:16852::"cervix normal in appearance","external genitalia normal","no adnexal masses or tenderness","no cervical motion tenderness","rectovaginal septum normal","uterus normal size, shape, and consistency","vagina normal without discharge"} Extremities: {extremity exam:5109} Neurologic: {neuro exam:17854}   Assessment:   No diagnosis found.   Plan:   There are no diagnoses linked to this encounter.    Rubie Maid, MD [facility]

## 2021-11-13 ENCOUNTER — Other Ambulatory Visit (HOSPITAL_COMMUNITY)
Admission: RE | Admit: 2021-11-13 | Discharge: 2021-11-13 | Disposition: A | Discharge status: Home / Self Care | Payer: Medicaid Other | Source: Ambulatory Visit | Attending: Obstetrics and Gynecology | Admitting: Obstetrics and Gynecology

## 2021-11-13 ENCOUNTER — Ambulatory Visit (INDEPENDENT_AMBULATORY_CARE_PROVIDER_SITE_OTHER): Payer: Medicaid Other | Admitting: Obstetrics and Gynecology

## 2021-11-13 ENCOUNTER — Encounter: Payer: Self-pay | Admitting: Obstetrics and Gynecology

## 2021-11-13 VITALS — BP 97/50 | HR 70 | Resp 16 | Ht 63.0 in | Wt 109.2 lb

## 2021-11-13 DIAGNOSIS — N898 Other specified noninflammatory disorders of vagina: Secondary | ICD-10-CM

## 2021-11-13 DIAGNOSIS — Z124 Encounter for screening for malignant neoplasm of cervix: Secondary | ICD-10-CM | POA: Diagnosis not present

## 2021-11-14 LAB — CERVICOVAGINAL ANCILLARY ONLY
Bacterial Vaginitis (gardnerella): NEGATIVE
Candida Glabrata: NEGATIVE
Candida Vaginitis: POSITIVE — AB
Comment: NEGATIVE
Comment: NEGATIVE
Comment: NEGATIVE

## 2021-11-14 MED ORDER — FLUCONAZOLE 150 MG PO TABS: 150.0000 mg | ORAL_TABLET | Freq: Once | ORAL | 2 refills | Status: AC

## 2021-11-14 NOTE — Addendum Note (Signed)
Addended by: Augusto Gamble on: 11/14/2021 04:56 PM   Modules accepted: Orders

## 2021-11-18 LAB — CYTOLOGY - PAP: Diagnosis: NEGATIVE

## 2021-12-12 ENCOUNTER — Encounter: Payer: Self-pay | Admitting: Certified Nurse Midwife

## 2021-12-16 IMAGING — MR MR BRAIN/IAC WO/W CM
12 series · 40 of 48 positions shown · IV contrast (multihance)
Comparison: Head CT 10/01/2010

CLINICAL DATA: Dizziness since [DATE], particularly when turning
the head.

EXAM:
MRI HEAD WITHOUT AND WITH CONTRAST
TECHNIQUE: Multiplanar, multiecho pulse sequences of the brain and surrounding
structures were obtained without and with intravenous contrast.
CONTRAST:  13mL MULTIHANCE GADOBENATE DIMEGLUMINE 529 MG/ML IV SOLN

[Series 2: T1 · sagittal · 5.0mm · 0.45mm/px · 3 of 21 slices shown (1 of 3)]
[im 1/21]
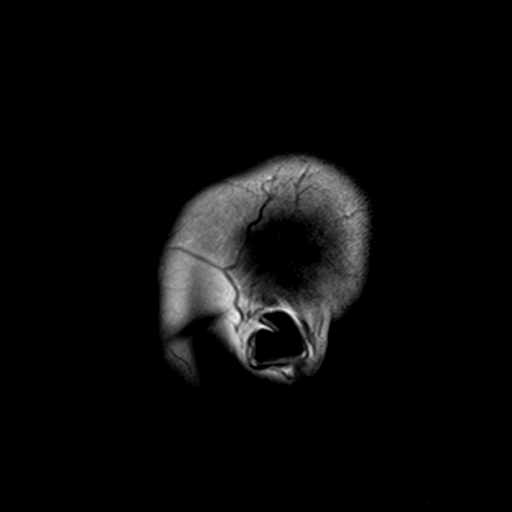
[im 11/21]
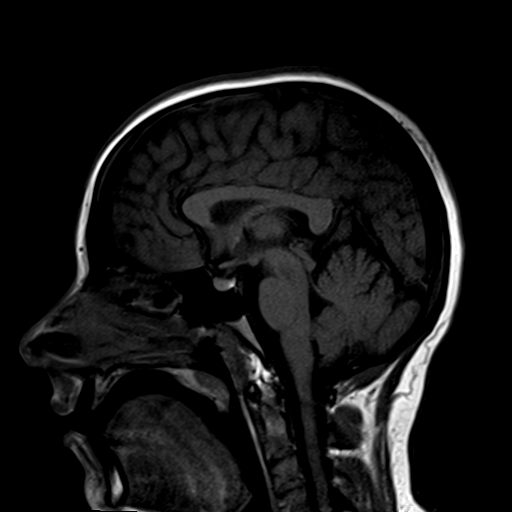
[im 21/21]
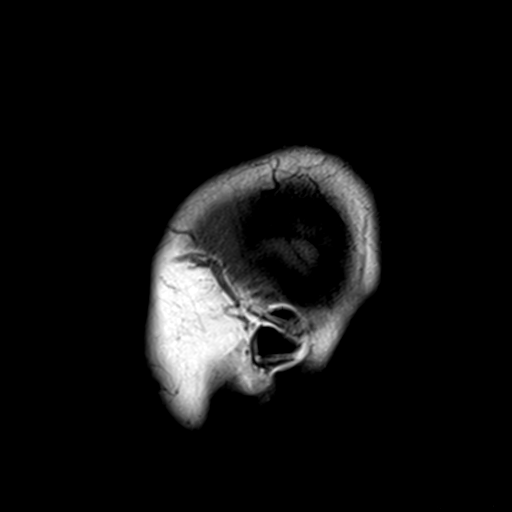

[Series 5: T2 · axial · 5.0mm · 0.45mm/px · z∈[-53,+90]mm · 3 of 23 slices shown]
[im 1/23]
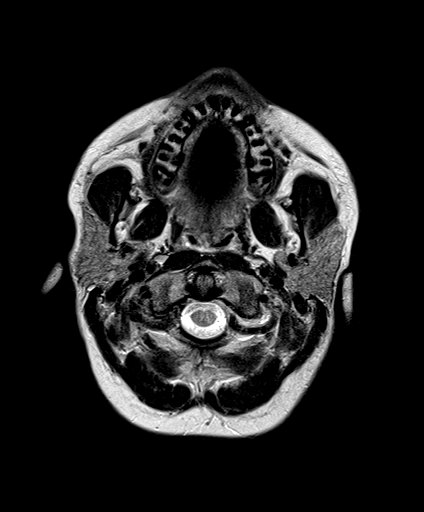
[im 12/23]
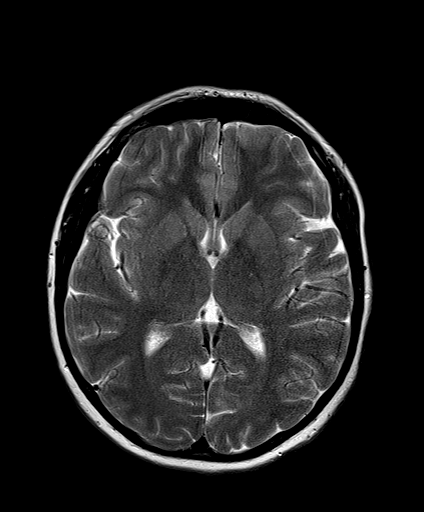
[im 23/23]
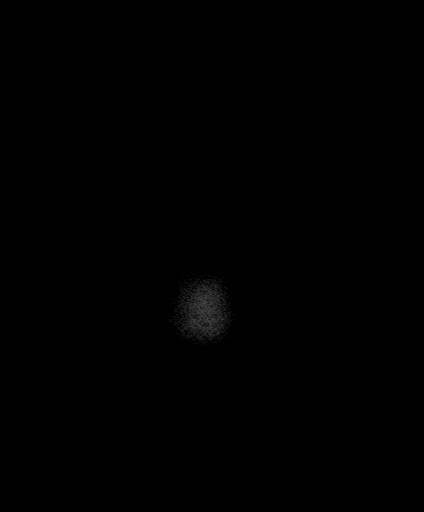

[Series 6: FLAIR · axial · 3.0mm · 0.45mm/px · z∈[-52,+92]mm · 3 of 25 slices shown]
[im 1/25]
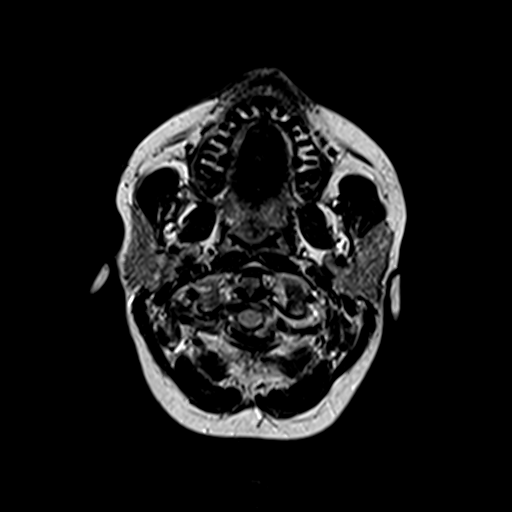
[im 13/25]
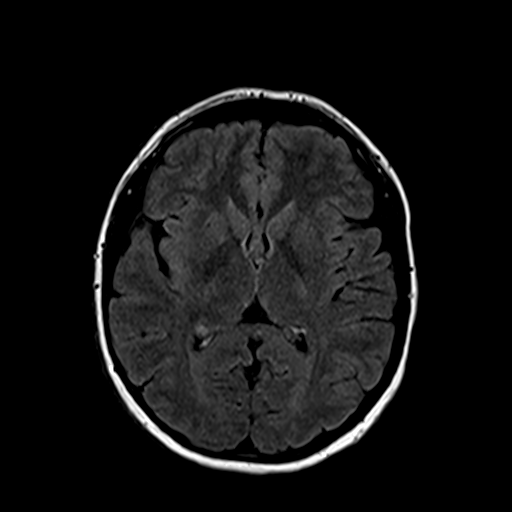
[im 25/25]
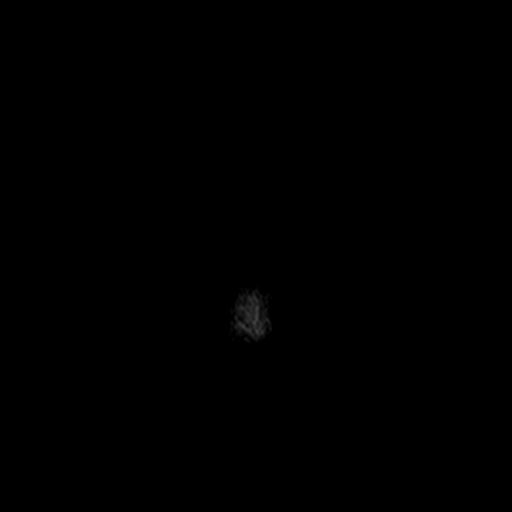

[Series 8: swi_images · axial · 3.0mm · 0.90mm/px · z∈[-52,+89]mm · 6 of 48 slices shown]
[im 1/48]
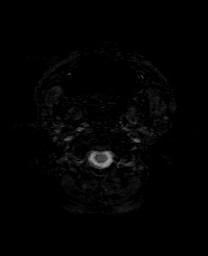
[im 10/48]
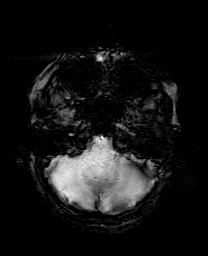
[im 19/48]
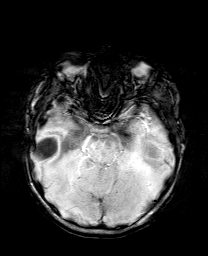
[im 29/48]
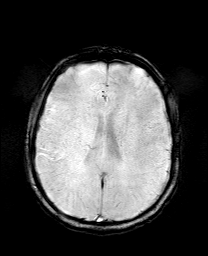
[im 38/48]
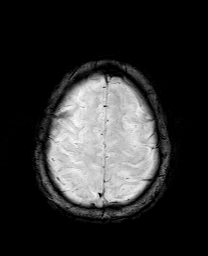
[im 48/48]
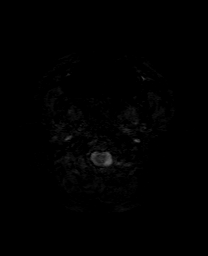

[Series 9: T1 · coronal · 3.0mm · 0.35mm/px · 1 of 11 slices shown (2 of 3)]
[im 1/11]
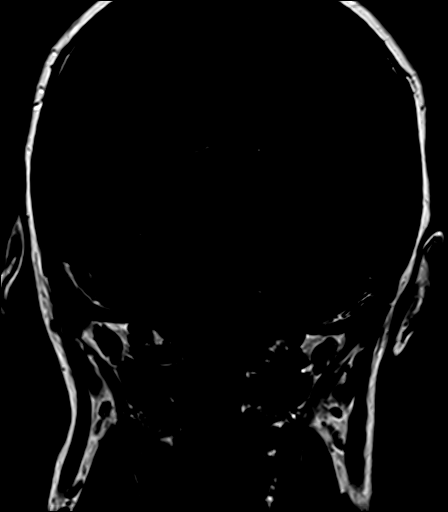

[Series 10: T1 · axial · 3.0mm · 0.35mm/px · 1 of 11 slices shown (3 of 3)]
[im 1/11]
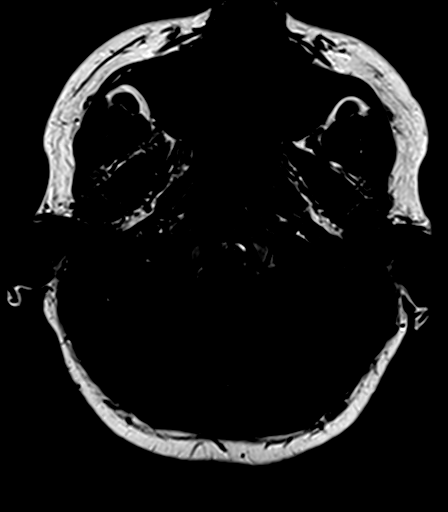

[Series 11: bSSFP · axial · 1.0mm · 0.28mm/px · z∈[-26,-1]mm · 3 of 26 slices shown]
[im 1/26]
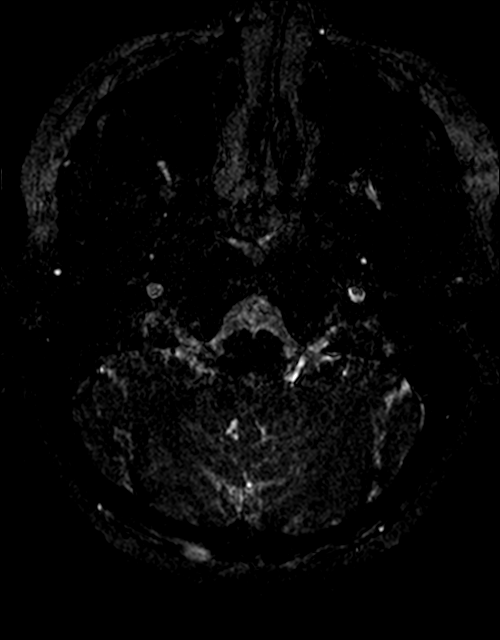
[im 13/26]
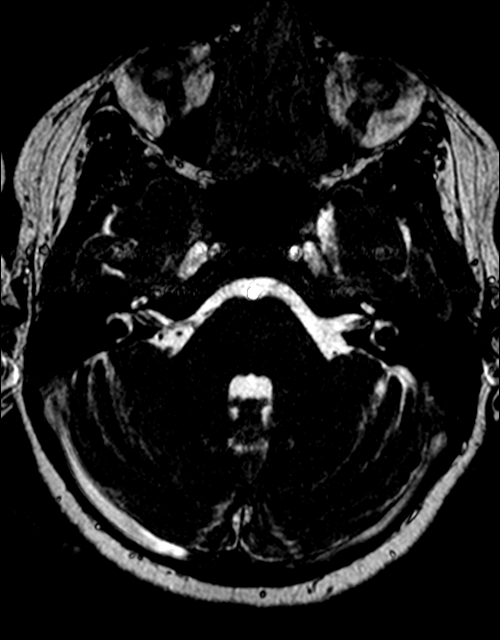
[im 26/26]
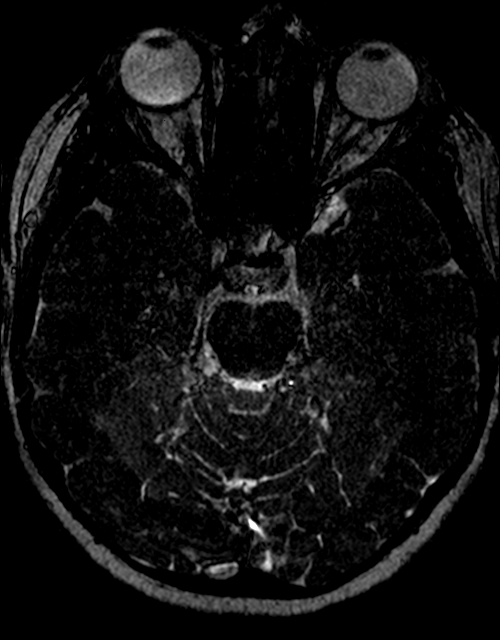

[Series 12: DWI · axial · 3.0mm · 1.80mm/px · z∈[-51,+90]mm · 11 of 96 slices shown (1 of 2)]
[im 1/96]
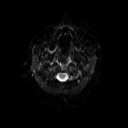
[im 10/96]
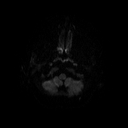
[im 20/96]
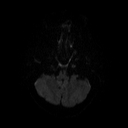
[im 29/96]
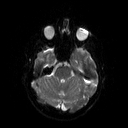
[im 39/96]
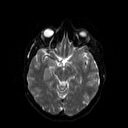
[im 48/96]
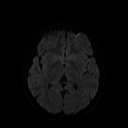
[im 58/96]
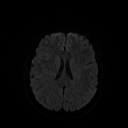
[im 67/96]
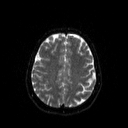
[im 77/96]
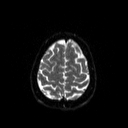
[im 86/96]
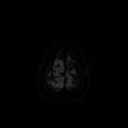
[im 96/96]
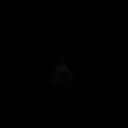

[Series 13: DWI · axial · 3.0mm · 1.80mm/px · z∈[-51,+90]mm · 6 of 48 slices shown (2 of 2)]
[im 1/48]
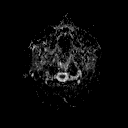
[im 10/48]
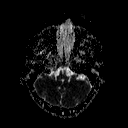
[im 19/48]
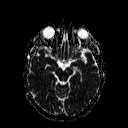
[im 29/48]
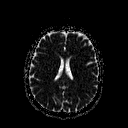
[im 38/48]
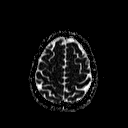
[im 48/48]
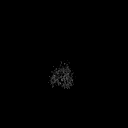

[Series 14: T1 post-contrast · coronal · 3.0mm · 0.35mm/px · 1 of 11 slices shown (1 of 2)]
[im 1/11]
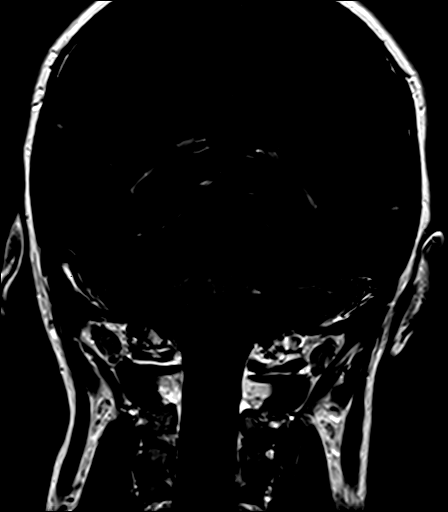

[Series 15: T1 post-contrast · axial · 3.0mm · 0.35mm/px · 1 of 11 slices shown (2 of 2)]
[im 1/11]
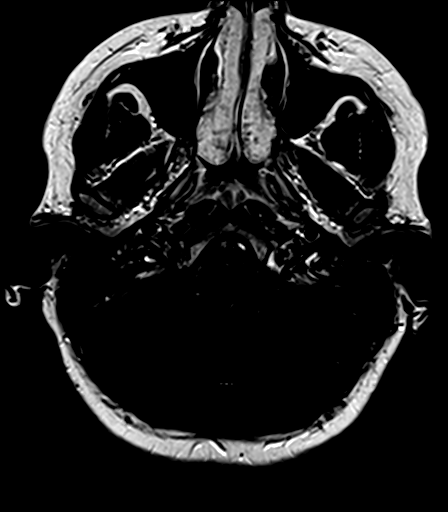

[Series 16: post_t1_mpr_tra · axial · 2.0mm · 0.45mm/px · 1 of 72 slices shown]
[im 1/72]
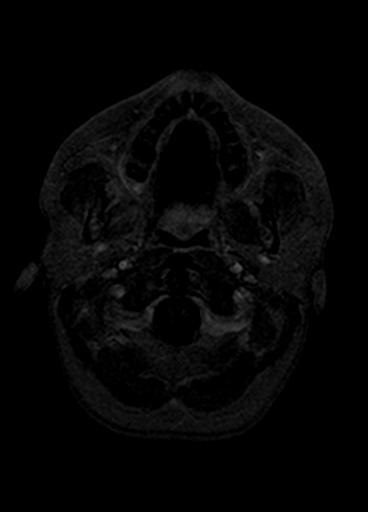

[40 of 48 positions shown; findings below may reference images not displayed]

FINDINGS: Brain: There is no evidence of acute infarct, intracranial
hemorrhage, mass, midline shift, or extra-axial fluid collection.
The ventricles and sulci are normal. The brain is normal in signal.
No abnormal enhancement is identified.

Dedicated imaging through the internal auditory canals demonstrates
a normal course of cranial nerves VII and VIII without evidence of
mass or abnormal enhancement. Inner ear structures demonstrate
normal signal bilaterally. No mass is seen within the
cerebellopontine angles.

Vascular: Major intracranial vascular flow voids are preserved.

Skull and upper cervical spine: Unremarkable bone marrow signal.

Sinuses/Orbits: Unremarkable orbits. Paranasal sinuses and mastoid
air cells are clear.

Other: None.
IMPRESSION: Unremarkable brain and internal auditory canal imaging.

## 2021-12-23 ENCOUNTER — Ambulatory Visit (INDEPENDENT_AMBULATORY_CARE_PROVIDER_SITE_OTHER): Payer: Medicaid Other | Admitting: Licensed Clinical Social Worker

## 2021-12-23 DIAGNOSIS — F411 Generalized anxiety disorder: Secondary | ICD-10-CM

## 2021-12-23 NOTE — Progress Notes (Signed)
Virtual Visit via Video Note  I connected with Amber Wilson on 12/23/21 at  8:00 AM EDT by a video enabled telemedicine application and verified that I am speaking with the correct person using two identifiers.  Location: Patient: Home Provider: Office   I discussed the limitations of evaluation and management by telemedicine and the availability of in person appointments. The patient expressed understanding and agreed to proceed.  THERAPIST PROGRESS NOTE  Session Time: 8:00 am-8:45 am  Type of Therapy: Individual Therapy   Purpose of Session/Treatment Goals addressed: "Amber Wilson will manage anxiety as evidenced by reducing panic attacks, managing triggers, and coping with stressors for 5 out of 7 days for 60 days."  Interventions: Therapist utilized CBT and Solution focused brief therapy to address anxiety. Therapist provided support and empathy to patient during session. Therapist explored patient's interpersonal relationships to identify triggers. Therapist reviewed personal values and personalization with patient.   Effectiveness: Patient was oriented x4 (person, place, situation, and time). Patient was casually dressed, and appropriately groomed. Patient was alert, engaged, pleasant, and cooperative. Patient noted that things are going better with her son's health. Her son is responding well to a course of treatment but they are trying to figure out what has improved his health. Patient shared her feelings about a friendship she has that leaves her feeling hurt and frustrated. She has a friend that she has had for 11 years. Her friend has been "flaky" for the majority of the relationship but patient has not said anything to her. She text her friend back in May on 3 different occasions to wish her a happy mother's day, let her know that patient's son is in the hospital, and to wish her a happy birthday. Patient got no response until just recently and her friend acted like she didn't respond  for 6 months. Patient was hurt by the 6 months of silence and grieved the relationship. She is going to keep a healthy boundary with this friend and keep the relationship at face value. Patient noted that how this friend acts has impacted her relationship with others for instance if she doesn't hear from another friend for a few hours she assumes she will not hear from her again. Patient understood that her values of friendship and communication are what is triggering her. She values the friendship but doesn't feel like her friend values it in the same way. She recognizes that she struggles with black and white thinking. She also know that she is personalizing others responses due to her friend.    Patient engaged in session. Patient responded well to interventions. Patient continues to meet criteria for Generalized Anxiety Disorder. Patient will continue in outpatient therapy due to being the least restrictive service to meet her needs. Patient made moderate progress on her goals.   Suicidal/Homicidal: Negativewithout intent/plan  Plan: Return again in 2-4 weeks. Patient will keep healthy boundaries with her friends.   Diagnosis: Axis I: Generalized Anxiety Disorder    Axis II: No diagnosis   I discussed the assessment and treatment plan with the patient. The patient was provided an opportunity to ask questions and all were answered. The patient agreed with the plan and demonstrated an understanding of the instructions.   The patient was advised to call back or seek an in-person evaluation if the symptoms worsen or if the condition fails to improve as anticipated.  I provided 45 minutes of non-face-to-face time during this encounter.  [name], LCSW 12/23/2021

## 2022-01-30 ENCOUNTER — Ambulatory Visit (INDEPENDENT_AMBULATORY_CARE_PROVIDER_SITE_OTHER): Payer: Medicaid Other | Admitting: Licensed Clinical Social Worker

## 2022-01-30 DIAGNOSIS — F411 Generalized anxiety disorder: Secondary | ICD-10-CM

## 2022-01-31 NOTE — Progress Notes (Signed)
Virtual Visit via Video Note  I connected with Amber Wilson on 01/31/22 at  1:00 PM EST by a video enabled telemedicine application and verified that I am speaking with the correct person using two identifiers.  Location: Patient: Home Provider: Office   I discussed the limitations of evaluation and management by telemedicine and the availability of in person appointments. The patient expressed understanding and agreed to proceed.  THERAPIST PROGRESS NOTE  Session Time: 1:00 pm-1:45 pm  Type of Therapy: Individual Therapy   Purpose of Session/Treatment Goals addressed: "Amber Wilson will manage anxiety as evidenced by reducing panic attacks, managing triggers, and coping with stressors for 5 out of 7 days for 60 days."  Interventions: Therapist utilized CBT and Solution focused brief therapy to address anxiety. Therapist provided support and empathy to patient. Therapist explored patient's feelings and her marriage dynamics.   Effectiveness: Patient was oriented x4 (person, place, situation, and time). Patient was casually dressed, and appropriately groomed. Patient was alert, engaged, sad, and cooperative. Patient noted that she has been sleeping better than before. Patient has not been eating regularly and has lost weight. Patient noted that she has felt stressed, down, and has "kinda cheated on my spouse." Patient has been uphappy with her spouse for some time. He doesn't work and she feels like a single mother who is married. Patient shared the turning point for her. On mother's day they had gone out with their twins for hibachi and the twins spilled food on themselves and patient. Patient was cleaning up the kids while her spouse sat on his phone. She asked him for help and he got up then walked out of the restaurant leaving her with the twins to clean up. She cares for him as a person but feels like she is falling out of love. She was not sure that she wanted to get married due to his behavior  before. Patient admitted that her husband calls her idiot and other names. She found out he has not been paying bills and lying about it. She thought they had money that they don't. He told her that she needs to get a higher paying job rather than him work more than 12 hours a week. Patient was speaking to a realtor when she thought they had money to put a deposit down. She admitted to having flirty interactions with the realtor but nothing physical, in person. She has no desire to have a relationship with the realtor but was more attracted to his drive, and desire for success. She has considered leaving her spouse but waiting until the holidays were over. She would have a place to live, etc but worries about her spouse.    Patient engaged in session. Patient responded well to interventions. Patient continues to meet criteria for Generalized Anxiety Disorder. Patient will continue in outpatient therapy due to being the least restrictive service to meet her needs. Patient made moderate progress on her goals.   Suicidal/Homicidal: Negativewithout intent/plan  Plan: Return again in 2-4 weeks. Patient will focus on decided what would be best for her and her children.  Diagnosis: Axis I: Generalized Anxiety Disorder    Axis II: No diagnosis   I discussed the assessment and treatment plan with the patient. The patient was provided an opportunity to ask questions and all were answered. The patient agreed with the plan and demonstrated an understanding of the instructions.   The patient was advised to call back or seek an in-person evaluation if the symptoms  worsen or if the condition fails to improve as anticipated.  I provided 45 minutes of non-face-to-face time during this encounter.  [name], LCSW 01/31/2022

## 2022-02-10 ENCOUNTER — Ambulatory Visit (INDEPENDENT_AMBULATORY_CARE_PROVIDER_SITE_OTHER): Payer: Medicaid Other | Admitting: Licensed Clinical Social Worker

## 2022-02-10 DIAGNOSIS — F411 Generalized anxiety disorder: Secondary | ICD-10-CM
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
## 2022-03-11 ENCOUNTER — Other Ambulatory Visit: Payer: Medicaid Other

## 2022-05-24 DIAGNOSIS — Z87891 Personal history of nicotine dependence: Secondary | ICD-10-CM | POA: Diagnosis not present

## 2022-05-24 DIAGNOSIS — O99211 Obesity complicating pregnancy, first trimester: Secondary | ICD-10-CM | POA: Diagnosis not present

## 2022-05-24 DIAGNOSIS — O208 Other hemorrhage in early pregnancy: Secondary | ICD-10-CM | POA: Diagnosis not present

## 2022-05-24 DIAGNOSIS — O209 Hemorrhage in early pregnancy, unspecified: Secondary | ICD-10-CM | POA: Diagnosis not present

## 2022-05-24 DIAGNOSIS — Z3A01 Less than 8 weeks gestation of pregnancy: Secondary | ICD-10-CM | POA: Diagnosis not present

## 2022-05-25 DIAGNOSIS — Z3A01 Less than 8 weeks gestation of pregnancy: Secondary | ICD-10-CM | POA: Diagnosis not present

## 2022-05-25 DIAGNOSIS — O99211 Obesity complicating pregnancy, first trimester: Secondary | ICD-10-CM | POA: Diagnosis not present

## 2022-05-25 DIAGNOSIS — O209 Hemorrhage in early pregnancy, unspecified: Secondary | ICD-10-CM | POA: Diagnosis not present

## 2022-08-21 ENCOUNTER — Encounter: Payer: Self-pay | Admitting: Family Medicine

## 2022-08-21 ENCOUNTER — Ambulatory Visit (INDEPENDENT_AMBULATORY_CARE_PROVIDER_SITE_OTHER): Payer: Medicaid Other | Admitting: Family Medicine

## 2022-08-21 VITALS — BP 116/63 | HR 60 | Ht 64.0 in | Wt 115.3 lb

## 2022-08-21 DIAGNOSIS — Z8659 Personal history of other mental and behavioral disorders: Secondary | ICD-10-CM | POA: Diagnosis not present

## 2022-08-21 DIAGNOSIS — Z3041 Encounter for surveillance of contraceptive pills: Secondary | ICD-10-CM | POA: Diagnosis not present

## 2022-08-21 DIAGNOSIS — Z0000 Encounter for general adult medical examination without abnormal findings: Secondary | ICD-10-CM

## 2022-08-21 DIAGNOSIS — D509 Iron deficiency anemia, unspecified: Secondary | ICD-10-CM | POA: Insufficient documentation

## 2022-08-21 MED ORDER — JUNEL FE 1/20 1-20 MG-MCG PO TABS: 1.0000 | ORAL_TABLET | Freq: Every day | ORAL | 4 refills | Status: DC

## 2022-08-21 NOTE — Assessment & Plan Note (Addendum)
Chronic use; denies desire for more children at this time Has 28 year old twin boys Has a BF/partner Denies further concerns for STI symptoms Continue OCPs as desired; 1 year f/u recommended  Has quit smoking; HTN n/a; no obesity

## 2022-08-21 NOTE — Assessment & Plan Note (Signed)
Chronic, self limiting Denies current concerns Self medication with MJ 3-4x/day

## 2022-08-21 NOTE — Assessment & Plan Note (Signed)
UTD on dental Due for vision UTD on PAP Things to do to keep yourself healthy  - Exercise at least 30-45 minutes a day, 3-4 days a week.  - Eat a low-fat diet with lots of fruits and vegetables, up to 7-9 servings per day.  - Seatbelts can save your life. Wear them always.  - Smoke detectors on every level of your home, check batteries every year.  - Eye Doctor - have an eye exam every 1-2 years  - Safe sex - if you may be exposed to STDs, use a condom.  - Alcohol -  If you drink, do it moderately, less than 2 drinks per day.  - Health Care Power of Attorney. Choose someone to speak for you if you are not able.  - Depression is common in our stressful world.If you're feeling down or losing interest in things you normally enjoy, please come in for a visit.  - Violence - If anyone is threatening or hurting you, please call immediately.

## 2022-08-21 NOTE — Progress Notes (Signed)
I,Sha'taria Tyson,acting as a Neurosurgeon for Jacky Kindle, FNP.,have documented all relevant documentation on the behalf of Jacky Kindle, FNP,as directed by  Jacky Kindle, FNP while in the presence of Jacky Kindle, FNP.   Complete physical exam   Patient: Amber Wilson   DOB: 1995/03/21   27 y.o. Female  MRN: [id number] Visit Date: 08/21/2022  Today's healthcare provider: Jacky Kindle, FNP   Introduced to nurse practitioner role and practice setting.  All questions answered.  Discussed provider/patient relationship and expectations.  Subjective    Amber Wilson is a 27 y.o. female who presents today for a complete physical exam.  She reports consuming a general diet.  The patient reports participating in pole fitness daily for one hour.  She generally feels well. She reports sleeping well. She does have additional problems to discuss today.   HPI  Would like to continue [facility] that was provided after emergency abortion  Past Medical History:  Diagnosis Date   Allergy    Anxiety    Depression    Past Surgical History:  Procedure Laterality Date   CESAREAN SECTION  04/18/2020   Procedure: CESAREAN SECTION;  Surgeon: Hildred Laser, MD;  Location: [facility] ORS;  Service: Obstetrics;;   TONSILLECTOMY     WISDOM TOOTH EXTRACTION     Social History   Socioeconomic History   Marital status: Single    Spouse name: Not on file   Number of children: Not on file   Years of education: Not on file   Highest education level: Not on file  Occupational History   Not on file  Tobacco Use   Smoking status: Former    Types: Cigarettes    Quit date: 02/15/2016    Years since quitting: 6.5   Smokeless tobacco: Never  Vaping Use   Vaping Use: Never used  Substance and Sexual Activity   Alcohol use: Never   Drug use: Not Currently    Frequency: 21.0 times per week    Types: Marijuana    Comment: 3-4 times per day   Sexual activity: Yes    Birth control/protection: OCP  Other  Topics Concern   Not on file  Social History Narrative   Not on file   Social Determinants of Health   Financial Resource Strain: Not on file  Food Insecurity: Not on file  Transportation Needs: Not on file  Physical Activity: Not on file  Stress: Not on file  Social Connections: Not on file  Intimate Partner Violence: Not on file   Family Status  Relation Name Status   Mother  Alive   Father  Alive   MGM  Alive   MGF  Alive   PGM  Alive   PGF  Alive   Family History  Problem Relation Age of Onset   Healthy Mother    Healthy Father    Healthy Maternal Grandmother    Healthy Maternal Grandfather    Healthy Paternal Grandmother    Healthy Paternal Grandfather    Allergies  Allergen Reactions   Penicillins Anaphylaxis and Rash   Pollen Extract     Patient Care Team: Jacky Kindle, FNP as PCP - General (Family Medicine)   Medications: Outpatient Medications Prior to Visit  Medication Sig   [DISCONTINUED] JUNEL FE 1/20 1-20 MG-MCG tablet Take 1 tablet by mouth daily.   No facility-administered medications prior to visit.    Review of Systems  Last CBC Lab Results  Component Value Date   WBC 15.3 (H) 04/19/2020   HGB 10.6 (L) 06/14/2020   HCT 33.8 (L) 06/14/2020   MCV 83.1 04/19/2020   MCH 28.4 04/19/2020   RDW 14.1 04/19/2020   PLT 176 04/19/2020   Last metabolic panel Lab Results  Component Value Date   GLUCOSE 82 07/15/2018   NA 139 07/15/2018   K 3.6 07/15/2018   CL 102 07/15/2018   CO2 21 07/15/2018   BUN 11 07/15/2018   CREATININE 0.71 07/15/2018   GFRNONAA 120 07/15/2018   CALCIUM 9.1 07/15/2018   PROT 7.4 07/15/2018   ALBUMIN 4.7 07/15/2018   LABGLOB 2.7 07/15/2018   AGRATIO 1.7 07/15/2018   BILITOT 0.4 07/15/2018   ALKPHOS 56 07/15/2018   AST 20 07/15/2018   ALT 16 07/15/2018   Last lipids Lab Results  Component Value Date   CHOL 139 07/15/2018   HDL 49 07/15/2018   LDLCALC 75 07/15/2018   TRIG 76 07/15/2018   CHOLHDL 2.8  07/15/2018   Last hemoglobin A1c No results found for: "HGBA1C" Last thyroid functions Lab Results  Component Value Date   TSH 2.960 07/15/2018   Last vitamin D No results found for: "25OHVITD2", "25OHVITD3", "VD25OH" Last vitamin B12 and Folate No results found for: "VITAMINB12", "FOLATE"    Objective    BP 116/63 (BP Location: Right Arm, Patient Position: Sitting, Cuff Size: Normal)   Pulse 60   Ht 5\' 4"  (1.626 m)   Wt 115 lb 4.8 oz (52.3 kg)   SpO2 98%   BMI 19.79 kg/m   BP Readings from Last 3 Encounters:  08/21/22 116/63  11/13/21 (!) 97/50  10/13/21 90/64   Wt Readings from Last 3 Encounters:  08/21/22 115 lb 4.8 oz (52.3 kg)  11/13/21 109 lb 3.2 oz (49.5 kg)  10/13/21 115 lb (52.2 kg)   SpO2 Readings from Last 3 Encounters:  08/21/22 98%  10/13/21 97%  04/20/20 99%   Physical Exam Vitals and nursing note reviewed.  Constitutional:      General: She is awake. She is not in acute distress.    Appearance: Normal appearance. She is well-developed, well-groomed and normal weight. She is not ill-appearing, toxic-appearing or diaphoretic.  HENT:     Head: Normocephalic and atraumatic.     Jaw: There is normal jaw occlusion. No trismus, tenderness, swelling or pain on movement.     Right Ear: Hearing, tympanic membrane, ear canal and external ear normal. There is no impacted cerumen.     Left Ear: Hearing, tympanic membrane, ear canal and external ear normal. There is no impacted cerumen.     Nose: Nose normal. No congestion or rhinorrhea.     Right Turbinates: Not enlarged, swollen or pale.     Left Turbinates: Not enlarged, swollen or pale.     Right Sinus: No maxillary sinus tenderness or frontal sinus tenderness.     Left Sinus: No maxillary sinus tenderness or frontal sinus tenderness.     Mouth/Throat:     Lips: Pink.     Mouth: Mucous membranes are moist. No injury.     Tongue: No lesions.     Pharynx: Oropharynx is clear. Uvula midline. No pharyngeal  swelling, oropharyngeal exudate, posterior oropharyngeal erythema or uvula swelling.     Tonsils: No tonsillar exudate or tonsillar abscesses.  Eyes:     General: Lids are normal. Lids are everted, no foreign bodies appreciated. Vision grossly intact. Gaze aligned appropriately. No allergic shiner or visual field deficit.  Right eye: No discharge.        Left eye: No discharge.     Extraocular Movements: Extraocular movements intact.     Conjunctiva/sclera: Conjunctivae normal.     Right eye: Right conjunctiva is not injected. No exudate.    Left eye: Left conjunctiva is not injected. No exudate.    Pupils: Pupils are equal, round, and reactive to light.  Neck:     Thyroid: No thyroid mass, thyromegaly or thyroid tenderness.     Vascular: No carotid bruit.     Trachea: Trachea normal.  Cardiovascular:     Rate and Rhythm: Normal rate and regular rhythm.     Pulses: Normal pulses.          Carotid pulses are 2+ on the right side and 2+ on the left side.      Radial pulses are 2+ on the right side and 2+ on the left side.       Dorsalis pedis pulses are 2+ on the right side and 2+ on the left side.       Posterior tibial pulses are 2+ on the right side and 2+ on the left side.     Heart sounds: Normal heart sounds, S1 normal and S2 normal. No murmur heard.    No friction rub. No gallop.  Pulmonary:     Effort: Pulmonary effort is normal. No respiratory distress.     Breath sounds: Normal breath sounds and air entry. No stridor. No wheezing, rhonchi or rales.  Chest:     Chest wall: No tenderness.  Abdominal:     General: Abdomen is flat. Bowel sounds are normal. There is no distension.     Palpations: Abdomen is soft. There is no mass.     Tenderness: There is no abdominal tenderness. There is no right CVA tenderness, left CVA tenderness, guarding or rebound.     Hernia: No hernia is present.  Genitourinary:    Comments: Exam deferred; denies complaints Musculoskeletal:         General: No swelling, tenderness, deformity or signs of injury. Normal range of motion.     Cervical back: Full passive range of motion without pain, normal range of motion and neck supple. No edema, rigidity or tenderness. No muscular tenderness.     Right lower leg: No edema.     Left lower leg: No edema.  Lymphadenopathy:     Cervical: No cervical adenopathy.     Right cervical: No superficial, deep or posterior cervical adenopathy.    Left cervical: No superficial, deep or posterior cervical adenopathy.  Skin:    General: Skin is warm and dry.     Capillary Refill: Capillary refill takes less than 2 seconds.     Coloration: Skin is not jaundiced or pale.     Findings: No bruising, erythema, lesion or rash.  Neurological:     General: No focal deficit present.     Mental Status: She is alert and oriented to person, place, and time. Mental status is at baseline.     GCS: GCS eye subscore is 4. GCS verbal subscore is 5. GCS motor subscore is 6.     Sensory: Sensation is intact. No sensory deficit.     Motor: Motor function is intact. No weakness.     Coordination: Coordination is intact. Coordination normal.     Gait: Gait is intact. Gait normal.  Psychiatric:        Attention and Perception: Attention and perception normal.  Mood and Affect: Mood and affect normal.        Speech: Speech normal.        Behavior: Behavior normal. Behavior is cooperative.        Thought Content: Thought content normal.        Cognition and Memory: Cognition and memory normal.        Judgment: Judgment normal.     Last depression screening scores    04/12/2020    9:57 AM 02/16/2019   11:42 AM 08/23/2018    9:40 AM  PHQ 2/9 Scores  PHQ - 2 Score 2 4 3   PHQ- 9 Score 9 17 11    Last fall risk screening    11/13/2021    9:00 AM  Fall Risk   Falls in the past year? 0  Number falls in past yr: 0  Injury with Fall? 0  Risk for fall due to : No Fall Risks  Follow up Falls evaluation  completed   Last Audit-C alcohol use screening    04/12/2020    9:57 AM  Alcohol Use Disorder Test (AUDIT)  1. How often do you have a drink containing alcohol? 1  2. How many drinks containing alcohol do you have on a typical day when you are drinking? 0  3. How often do you have six or more drinks on one occasion? 0  AUDIT-C Score 1   A score of 3 or more in women, and 4 or more in men indicates increased risk for alcohol abuse, EXCEPT if all of the points are from question 1   No results found for any visits on 08/21/22.  Assessment & Plan    Routine Health Maintenance and Physical Exam  Exercise Activities and Dietary recommendations  Goals       "I need to handle this anxiety better" completed (pt-stated)      Current Barriers:  Mental Health Concerns  Substance abuse issues - daily marijuana use to cope with anxiety  Clinical Social Work Clinical Goal(s):  Over the next 30 days, client will follow up with a mental health professional in the community * as directed by SW  Interventions: Patient interviewed and appropriate assessments performed [facility] contacted and informed that patient has not scheduled an intake appointment Patient contacted and strongly encouraged follow up [facility] call [facility] to [facility] Confirmed that initial intake appointment has been scheduled for 10/07/2018  Patient Self Care Activities:  Attends all scheduled provider appointments Performs ADL's independently Performs IADL's independently  Please see past updates related to this goal by clicking on the "Past Updates" button in the selected goal          Immunization History  Administered Date(s) Administered   Covid-19, Mrna,Vaccine(Spikevax)71yrs and older 01/31/2022   Influenza,inj,Quad PF,6+ Mos 02/15/2020   Influenza,inj,quad, With Preservative 01/10/2021   Moderna Sars-Covid-2 Vaccination 08/08/2019, 09/12/2019    PPD Test 03/09/2018   Pfizer Covid-19 Vaccine Bivalent Booster 81yrs & up 01/10/2021   Td 07/15/2018   Tdap 02/15/2020    Health Maintenance  Topic Date Due   INFLUENZA VACCINE  10/02/2022   PAP-Cervical Cytology Screening  11/13/2024   PAP SMEAR-Modifier  11/13/2024   DTaP/Tdap/Td (3 - Td or Tdap) 02/14/2030   COVID-19 Vaccine  Completed   Hepatitis C Screening  Completed   HIV Screening  Completed   HPV VACCINES  Aged Out    Discussed health benefits of physical activity, and encouraged her to engage in  regular exercise appropriate for her age and condition.  Problem List Items Addressed This Visit       Other   Annual physical exam - Primary    UTD on dental Due for vision UTD on PAP Things to do to keep yourself healthy  - Exercise at least 30-45 minutes a day, 3-4 days a week.  - Eat a low-fat diet with lots of fruits and vegetables, up to 7-9 servings per day.  - Seatbelts can save your life. Wear them always.  - Smoke detectors on every level of your home, check batteries every year.  - Eye Doctor - have an eye exam every 1-2 years  - Safe sex - if you may be exposed to STDs, use a condom.  - Alcohol -  If you drink, do it moderately, less than 2 drinks per day.  - Health Care Power of Attorney. Choose someone to speak for you if you are not able.  - Depression is common in our stressful world.If you're feeling down or losing interest in things you normally enjoy, please come in for a visit.  - Violence - If anyone is threatening or hurting you, please call immediately.       Relevant Orders   CBC with Differential/Platelet   Comprehensive Metabolic Panel (CMET)   TSH + free T4   Lipid panel   History of anxiety    Chronic, self limiting Denies current concerns Self medication with MJ 3-4x/day      Surveillance for birth control, oral contraceptives    Chronic use; denies desire for more children at this time Has 67 year old twin boys Has a  BF/partner Denies further concerns for STI symptoms Continue OCPs as desired; 1 year f/u recommended  Has quit smoking; HTN n/a; no obesity      Return in about 1 year (around 08/21/2023) for annual examination.    Leilani Merl, FNP, have reviewed all documentation for this visit. The documentation on 08/21/22 for the exam, diagnosis, procedures, and orders are all accurate and complete.  Jacky Kindle, FNP  [facility] [phone] (phone) [phone] (fax)  [facility]

## 2022-09-03 ENCOUNTER — Ambulatory Visit
Admission: RE | Admit: 2022-09-03 | Discharge: 2022-09-03 | Disposition: A | Discharge status: Home / Self Care | Payer: Medicaid Other | Source: Ambulatory Visit | Attending: Emergency Medicine | Admitting: Emergency Medicine

## 2022-09-03 VITALS — BP 115/71 | HR 87 | Temp 98.3°F | Resp 18

## 2022-09-03 DIAGNOSIS — B349 Viral infection, unspecified: Secondary | ICD-10-CM | POA: Diagnosis not present

## 2022-09-03 MED ORDER — BENZONATATE 100 MG PO CAPS: 100.0000 mg | ORAL_CAPSULE | Freq: Three times a day (TID) | ORAL | 0 refills | Status: DC | PRN

## 2022-09-03 NOTE — ED Triage Notes (Addendum)
Patient to Urgent Care with complaints of cough/ nasal congestion/ sinus pain. Fevers. Watering eyes/ burning nose. Symptoms started approx 24 hours ago. Kids sick with same symptoms x1 week.  Max temp 100.2.  Has been taking Dayquil/ Nyquil/ ibuprofen.

## 2022-09-03 NOTE — ED Provider Notes (Signed)
[name]    CSN: [id number] Arrival date & time: 09/03/22  1817      History   Chief Complaint Chief Complaint  Patient presents with   Cough    Sinus pain, slight fever - Entered by patient    HPI Amber Wilson is a 27 y.o. female.  Patient presents with 1 day history of fever, congestion, runny nose, and cough.  Tmax 100.2.  Treating with Dayquil and Nyquil.  No rash, sore throat, shortness of breath, or other symptoms.    The history is provided by the patient and medical records.    Past Medical History:  Diagnosis Date   Allergy    Anxiety    Depression     Patient Active Problem List   Diagnosis Date Noted   Annual physical exam 08/21/2022   History of anxiety 08/21/2022   Surveillance for birth control, oral contraceptives 08/21/2022    Past Surgical History:  Procedure Laterality Date   CESAREAN SECTION  04/18/2020   Procedure: CESAREAN SECTION;  Surgeon: Hildred Laser, MD;  Location: [facility] ORS;  Service: Obstetrics;;   TONSILLECTOMY     WISDOM TOOTH EXTRACTION      OB History     Gravida  1   Para  0   Term  0   Preterm  0   AB  0   Living  0      SAB  0   IAB  0   Ectopic  0   Multiple  0   Live Births  0            Home Medications    Prior to Admission medications   Medication Sig Start Date End Date Taking? Authorizing Provider  benzonatate (TESSALON) 100 MG capsule Take 1 capsule (100 mg total) by mouth 3 (three) times daily as needed for cough. 09/03/22  Yes Mickie Bail, NP  JUNEL FE 1/20 1-20 MG-MCG tablet Take 1 tablet by mouth daily. 08/21/22   Jacky Kindle, FNP    Family History Family History  Problem Relation Age of Onset   Healthy Mother    Healthy Father    Healthy Maternal Grandmother    Healthy Maternal Grandfather    Healthy Paternal Grandmother    Healthy Paternal Grandfather     Social History Social History   Tobacco Use   Smoking status: Former    Types: Cigarettes     Quit date: 02/15/2016    Years since quitting: 6.5   Smokeless tobacco: Never  Vaping Use   Vaping Use: Never used  Substance Use Topics   Alcohol use: Never   Drug use: Not Currently    Frequency: 21.0 times per week    Types: Marijuana    Comment: 3-4 times per day     Allergies   Penicillins and Pollen extract   Review of Systems Review of Systems  Constitutional:  Negative for chills and fever.  HENT:  Positive for congestion and rhinorrhea. Negative for ear pain and sore throat.   Respiratory:  Positive for cough. Negative for shortness of breath.   Cardiovascular:  Negative for chest pain and palpitations.  Skin:  Negative for color change and rash.  All other systems reviewed and are negative.    Physical Exam Triage Vital Signs ED Triage Vitals  Enc Vitals Group     BP      Pulse      Resp      Temp  Temp src      SpO2      Weight      Height      Head Circumference      Peak Flow      Pain Score      Pain Loc      Pain Edu?      Excl. in GC?    No data found.  Updated Vital Signs BP 115/71   Pulse 87   Temp 98.3 F (36.8 C)   Resp 18   LMP 08/24/2022   SpO2 98%   Visual Acuity Right Eye Distance:   Left Eye Distance:   Bilateral Distance:    Right Eye Near:   Left Eye Near:    Bilateral Near:     Physical Exam Vitals and nursing note reviewed.  Constitutional:      General: She is not in acute distress.    Appearance: She is well-developed.  HENT:     Right Ear: Tympanic membrane normal.     Left Ear: Tympanic membrane normal.     Nose: Rhinorrhea present.     Mouth/Throat:     Mouth: Mucous membranes are moist.     Pharynx: Oropharynx is clear.  Cardiovascular:     Rate and Rhythm: Normal rate and regular rhythm.     Heart sounds: Normal heart sounds.  Pulmonary:     Effort: Pulmonary effort is normal. No respiratory distress.     Breath sounds: Normal breath sounds.  Musculoskeletal:     Cervical back: Neck supple.   Skin:    General: Skin is warm and dry.  Neurological:     Mental Status: She is alert.  Psychiatric:        Mood and Affect: Mood normal.        Behavior: Behavior normal.      UC Treatments / Results  Labs (all labs ordered are listed, but only abnormal results are displayed) Labs Reviewed - No data to display  EKG   Radiology No results found.  Procedures Procedures (including critical care time)  Medications Ordered in UC Medications - No data to display  Initial Impression / Assessment and Plan / UC Course  I have reviewed the triage vital signs and the nursing notes.  Pertinent labs & imaging results that were available during my care of the patient were reviewed by me and considered in my medical decision making (see chart for details).    Viral illness.  Patient has been symptomatic for 1 day.  Treating cough with Tessalon Perles.  Tylenol or ibuprofen as needed.  Instructed patient to follow up with her PCP if her symptoms are not improving.  Education provided on viral illness.  She agrees to plan of care.   Final Clinical Impressions(s) / UC Diagnoses   Final diagnoses:  Viral illness     Discharge Instructions      Take the Tessalon Perles as directed for cough.  Follow up with your primary care provider if your symptoms are not improving.        ED Prescriptions     Medication Sig Dispense Auth. Provider   benzonatate (TESSALON) 100 MG capsule Take 1 capsule (100 mg total) by mouth 3 (three) times daily as needed for cough. 21 capsule Mickie Bail, NP      PDMP not reviewed this encounter.   Mickie Bail, NP 09/03/22 [phone]

## 2022-09-03 NOTE — Discharge Instructions (Addendum)
Take the Tessalon Perles as directed for cough.  Follow up with your primary care provider if your symptoms are not improving.    

## 2022-10-17 ENCOUNTER — Ambulatory Visit (INDEPENDENT_AMBULATORY_CARE_PROVIDER_SITE_OTHER): Payer: Medicaid Other | Admitting: Family Medicine

## 2022-10-17 ENCOUNTER — Encounter: Payer: Self-pay | Admitting: Family Medicine

## 2022-10-17 VITALS — BP 94/56 | HR 71 | Ht 63.0 in | Wt 127.3 lb

## 2022-10-17 DIAGNOSIS — L720 Epidermal cyst: Secondary | ICD-10-CM | POA: Insufficient documentation

## 2022-10-17 DIAGNOSIS — B079 Viral wart, unspecified: Secondary | ICD-10-CM

## 2022-10-17 DIAGNOSIS — D229 Melanocytic nevi, unspecified: Secondary | ICD-10-CM | POA: Diagnosis not present

## 2022-10-17 NOTE — Assessment & Plan Note (Signed)
Chronic, worsening Increasing in size and diameter Pale skin tone; request for dermatology referral

## 2022-10-17 NOTE — Assessment & Plan Note (Signed)
Chronic, worsening Failed conservative treatments

## 2022-10-17 NOTE — Progress Notes (Signed)
Established patient visit   Patient: Amber Wilson   DOB: 1995/03/11   27 y.o. Female  MRN: [id number] Visit Date: 10/17/2022  Today's healthcare provider: Jacky Kindle, FNP  Introduced to nurse practitioner role and practice setting.  All questions answered.  Discussed provider/patient relationship and expectations.  Subjective    HPI HPI     Medical Management of Chronic Issues    Additional comments: Referral for dermatology      Last edited by Rolly Salter, CMA on 10/17/2022 11:16 AM.      Medications: Outpatient Medications Prior to Visit  Medication Sig   benzonatate (TESSALON) 100 MG capsule Take 1 capsule (100 mg total) by mouth 3 (three) times daily as needed for cough.   JUNEL FE 1/20 1-20 MG-MCG tablet Take 1 tablet by mouth daily.   No facility-administered medications prior to visit.    Review of Systems    Objective    BP (!) 94/56 (BP Location: Left Arm, Patient Position: Sitting, Cuff Size: Normal)   Pulse 71   Ht 5\' 3"  (1.6 m)   Wt 127 lb 4.8 oz (57.7 kg)   SpO2 99%   BMI 22.55 kg/m   Physical Exam Vitals and nursing note reviewed.  Constitutional:      General: She is not in acute distress.    Appearance: Normal appearance. She is normal weight. She is not ill-appearing, toxic-appearing or diaphoretic.  HENT:     Head: Normocephalic and atraumatic.  Cardiovascular:     Rate and Rhythm: Normal rate and regular rhythm.  Pulmonary:     Effort: Pulmonary effort is normal. No respiratory distress.  Musculoskeletal:        General: No swelling, tenderness, deformity or signs of injury. Normal range of motion.     Right lower leg: No edema.     Left lower leg: No edema.  Skin:    General: Skin is warm and dry.     Capillary Refill: Capillary refill takes less than 2 seconds.     Coloration: Skin is not jaundiced or pale.     Findings: Lesion present. No bruising, erythema or rash.       Neurological:     General: No focal  deficit present.     Mental Status: She is alert and oriented to person, place, and time. Mental status is at baseline.     Cranial Nerves: No cranial nerve deficit.     Sensory: No sensory deficit.     Motor: No weakness.     Coordination: Coordination normal.  Psychiatric:        Mood and Affect: Mood normal.        Behavior: Behavior normal.        Thought Content: Thought content normal.        Judgment: Judgment normal.     No results found for any visits on 10/17/22.  Assessment & Plan     Problem List Items Addressed This Visit       Musculoskeletal and Integument   Epidermoid cyst of skin of thigh - Primary    1 cm, pale/gray color Denies discharge Recommend surgical incision Encouraged to use warm compress Monitor for s/s of abscess and f/u with derm for cell wall removal       Multiple atypical nevi    Chronic, worsening Increasing in size and diameter Pale skin tone; request for dermatology referral       Relevant Orders   Ambulatory  referral to Dermatology   Viral warts    Chronic, worsening Failed conservative treatments       Relevant Orders   Ambulatory referral to Dermatology   No follow-ups on file.     Leilani Merl, FNP, have reviewed all documentation for this visit. The documentation on 10/17/22 for the exam, diagnosis, procedures, and orders are all accurate and complete.  Jacky Kindle, FNP  [facility] [phone] (phone) [phone] (fax)  [facility]

## 2022-10-17 NOTE — Assessment & Plan Note (Signed)
1 cm, pale/gray color Denies discharge Recommend surgical incision Encouraged to use warm compress Monitor for s/s of abscess and f/u with derm for cell wall removal

## 2022-12-16 ENCOUNTER — Ambulatory Visit: Payer: Medicaid Other

## 2023-02-11 ENCOUNTER — Encounter: Payer: Self-pay | Admitting: Family Medicine

## 2023-02-11 ENCOUNTER — Ambulatory Visit (INDEPENDENT_AMBULATORY_CARE_PROVIDER_SITE_OTHER): Payer: Self-pay | Admitting: Family Medicine

## 2023-02-11 VITALS — BP 108/68 | HR 56 | Ht 63.0 in | Wt 127.0 lb

## 2023-02-11 DIAGNOSIS — H0011 Chalazion right upper eyelid: Secondary | ICD-10-CM

## 2023-02-11 MED ORDER — CLOBETASOL PROPIONATE 0.05 % EX OINT: 1.0000 | TOPICAL_OINTMENT | Freq: Two times a day (BID) | CUTANEOUS | 0 refills | Status: AC

## 2023-02-11 MED ORDER — PREDNISONE 50 MG PO TABS: 50.0000 mg | ORAL_TABLET | Freq: Every day | ORAL | 0 refills | Status: AC

## 2023-02-15 DIAGNOSIS — H0011 Chalazion right upper eyelid: Secondary | ICD-10-CM | POA: Insufficient documentation

## 2023-02-15 NOTE — Progress Notes (Signed)
Established patient visit   Patient: Amber Wilson   DOB: 06-Mar-1995   27 y.o. Female  MRN: [id number] Visit Date: 02/11/2023  Today's healthcare provider: Jacky Kindle, FNP  Introduced to nurse practitioner role and practice setting.  All questions answered.  Discussed provider/patient relationship and expectations.  Subjective    HPI HPI   Right eye upper lid-hard knot, redness--3 weeks. Pt urgent care prescribe antibiotic and cream. Last edited by Shelly Bombard, CMA on 02/11/2023  3:47 PM.      Medications: Outpatient Medications Prior to Visit  Medication Sig   [DISCONTINUED] benzonatate (TESSALON) 100 MG capsule Take 1 capsule (100 mg total) by mouth 3 (three) times daily as needed for cough.   [DISCONTINUED] JUNEL FE 1/20 1-20 MG-MCG tablet Take 1 tablet by mouth daily.   No facility-administered medications prior to visit.   Last CBC Lab Results  Component Value Date   WBC 15.3 (H) 04/19/2020   HGB 10.6 (L) 06/14/2020   HCT 33.8 (L) 06/14/2020   MCV 83.1 04/19/2020   MCH 28.4 04/19/2020   RDW 14.1 04/19/2020   PLT 176 04/19/2020   Last metabolic panel Lab Results  Component Value Date   GLUCOSE 82 07/15/2018   NA 139 07/15/2018   K 3.6 07/15/2018   CL 102 07/15/2018   CO2 21 07/15/2018   BUN 11 07/15/2018   CREATININE 0.71 07/15/2018   GFRNONAA 120 07/15/2018   CALCIUM 9.1 07/15/2018   PROT 7.4 07/15/2018   ALBUMIN 4.7 07/15/2018   LABGLOB 2.7 07/15/2018   AGRATIO 1.7 07/15/2018   BILITOT 0.4 07/15/2018   ALKPHOS 56 07/15/2018   AST 20 07/15/2018   ALT 16 07/15/2018   Last lipids Lab Results  Component Value Date   CHOL 139 07/15/2018   HDL 49 07/15/2018   LDLCALC 75 07/15/2018   TRIG 76 07/15/2018   CHOLHDL 2.8 07/15/2018   Last hemoglobin A1c No results found for: "HGBA1C" Last thyroid functions Lab Results  Component Value Date   TSH 2.960 07/15/2018     Objective    BP 108/68 (BP Location: Right Arm, Patient Position:  Sitting, Cuff Size: Normal)   Pulse (!) 56   Ht 5\' 3"  (1.6 m)   Wt 127 lb (57.6 kg)   BMI 22.50 kg/m   BP Readings from Last 3 Encounters:  02/11/23 108/68  10/17/22 (!) 94/56  09/03/22 115/71   Wt Readings from Last 3 Encounters:  02/11/23 127 lb (57.6 kg)  10/17/22 127 lb 4.8 oz (57.7 kg)  08/21/22 115 lb 4.8 oz (52.3 kg)   SpO2 Readings from Last 3 Encounters:  10/17/22 99%  09/03/22 98%  08/21/22 98%   Physical Exam Vitals and nursing note reviewed.  Constitutional:      General: She is not in acute distress.    Appearance: Normal appearance. She is overweight. She is not ill-appearing, toxic-appearing or diaphoretic.  HENT:     Head: Normocephalic and atraumatic.  Cardiovascular:     Rate and Rhythm: Regular rhythm. Bradycardia present.     Pulses: Normal pulses.     Heart sounds: Normal heart sounds. No murmur heard.    No friction rub. No gallop.  Pulmonary:     Effort: Pulmonary effort is normal. No respiratory distress.     Breath sounds: Normal breath sounds. No stridor. No wheezing, rhonchi or rales.  Chest:     Chest wall: No tenderness.  Musculoskeletal:  General: No swelling, tenderness, deformity or signs of injury. Normal range of motion.     Right lower leg: No edema.     Left lower leg: No edema.  Skin:    General: Skin is warm and dry.     Capillary Refill: Capillary refill takes less than 2 seconds.     Coloration: Skin is not jaundiced or pale.     Findings: No bruising, erythema, lesion or rash.  Neurological:     General: No focal deficit present.     Mental Status: She is alert and oriented to person, place, and time. Mental status is at baseline.     Cranial Nerves: No cranial nerve deficit.     Sensory: No sensory deficit.     Motor: No weakness.     Coordination: Coordination normal.  Psychiatric:        Mood and Affect: Mood normal.        Behavior: Behavior normal.        Thought Content: Thought content normal.         Judgment: Judgment normal.     No results found for any visits on 02/11/23.  Assessment & Plan     Problem List Items Addressed This Visit       Musculoskeletal and Integument   Chalazion of right upper eyelid - Primary   Acute x 3 weeks; some improvement with warm compresses Recommend steroid cream vs antibiotic cream to assist Continue to monitor F/u as needed      Relevant Medications   clobetasol ointment (TEMOVATE) 0.05 %   predniSONE (DELTASONE) 50 MG tablet   No follow-ups on file.     Leilani Merl, FNP, have reviewed all documentation for this visit. The documentation on 02/15/23 for the exam, diagnosis, procedures, and orders are all accurate and complete.  Jacky Kindle, FNP  [facility] [phone] (phone) [phone] (fax)  [facility]

## 2023-02-15 NOTE — Assessment & Plan Note (Signed)
Acute x 3 weeks; some improvement with warm compresses Recommend steroid cream vs antibiotic cream to assist Continue to monitor F/u as needed

## 2023-03-05 ENCOUNTER — Ambulatory Visit: Payer: Medicaid Other | Admitting: Physician Assistant

## 2023-03-05 ENCOUNTER — Encounter: Payer: Self-pay | Admitting: Physician Assistant

## 2023-03-05 VITALS — BP 100/66 | HR 77 | Temp 97.9°F | Resp 16 | Ht 63.0 in | Wt 128.6 lb

## 2023-03-05 DIAGNOSIS — J069 Acute upper respiratory infection, unspecified: Secondary | ICD-10-CM

## 2023-03-05 MED ORDER — AZITHROMYCIN 250 MG PO TABS: ORAL_TABLET | ORAL | 0 refills | Status: AC

## 2023-03-05 NOTE — Progress Notes (Signed)
 Acute Office Visit   Patient: Amber Wilson   DOB: June 05, 1995   28 y.o. Female  MRN: [id number] Visit Date: 03/05/2023  Today's healthcare provider: Rocky BRAVO Montague Corella, PA-C  Introduced myself to the patient as a PA-C and provided education on APPs in clinical practice.    Chief Complaint  Patient presents with   Cough    On/off for a month- a little pain on left rib   ear pressure   Nasal Congestion   Headache   Subjective    HPI HPI     Cough    Additional comments: On/off for a month- a little pain on left rib      Last edited by Tressa Ogles, CMA on 03/05/2023  2:39 PM.       URI -type symptoms   Onset: gradual  Duration: on and off for about a month with regards to cough  Other symptoms started about 3 days ago  Associated symptoms: cough, ear pressure, ear pain, chest heaviness, myalgias   Intervention: Sudafed, Dayquil/ nyquil  Recent sick contacts: several of her children have been sick as well - ear infections and stomach symptoms  Recent travel: none  COVID testing at home: her and her hold household have been testing several times Result: negative results so far    Medications: Outpatient Medications Prior to Visit  Medication Sig   clobetasol  ointment (TEMOVATE ) 0.05 % Apply 1 Application topically 2 (two) times daily.   predniSONE  (DELTASONE ) 50 MG tablet Take 1 tablet (50 mg total) by mouth daily with breakfast.   No facility-administered medications prior to visit.    Review of Systems  Constitutional:  Positive for chills and fatigue. Negative for fever.  HENT:  Positive for ear pain, postnasal drip and rhinorrhea. Negative for congestion, sinus pressure, sinus pain and sore throat.   Respiratory:  Positive for cough and chest tightness.   Gastrointestinal:  Positive for diarrhea (intermitten) and nausea (intermittent). Negative for vomiting.  Musculoskeletal:  Positive for myalgias.  Neurological:  Negative for  headaches.        Objective    BP 100/66   Pulse 77   Temp 97.9 F (36.6 C) (Oral)   Resp 16   Ht 5' 3 (1.6 m)   Wt 128 lb 9.6 oz (58.3 kg)   SpO2 99%   BMI 22.78 kg/m     Physical Exam Vitals reviewed.  Constitutional:      General: She is awake.     Appearance: Normal appearance. She is well-developed and well-groomed.  HENT:     Head: Normocephalic and atraumatic.     Right Ear: Hearing and ear canal normal. A middle ear effusion is present.     Left Ear: Hearing and ear canal normal. A middle ear effusion is present.     Mouth/Throat:     Lips: Pink.     Pharynx: Uvula midline. Posterior oropharyngeal erythema and postnasal drip present. No oropharyngeal exudate.     Tonsils: No tonsillar exudate.  Cardiovascular:     Rate and Rhythm: Normal rate and regular rhythm.     Pulses: Normal pulses.          Radial pulses are 2+ on the right side and 2+ on the left side.     Heart sounds: Normal heart sounds. No murmur heard. Pulmonary:     Effort: Pulmonary effort is normal.     Breath sounds: Normal breath sounds. No  decreased air movement. No decreased breath sounds, wheezing, rhonchi or rales.  Musculoskeletal:     Cervical back: Normal range of motion and neck supple.     Right lower leg: No edema.     Left lower leg: No edema.  Lymphadenopathy:     Head:     Right side of head: No submental, submandibular or preauricular adenopathy.     Left side of head: No submental, submandibular or preauricular adenopathy.     Cervical:     Right cervical: No superficial cervical adenopathy.    Left cervical: No superficial cervical adenopathy.     Upper Body:     Right upper body: No supraclavicular adenopathy.     Left upper body: No supraclavicular adenopathy.  Neurological:     Mental Status: She is alert.  Psychiatric:        Attention and Perception: Attention and perception normal.        Mood and Affect: Mood normal.        Speech: Speech normal.         Behavior: Behavior normal. Behavior is cooperative.       No results found for any visits on 03/05/23.  Assessment & Plan      No follow-ups on file.      Problem List Items Addressed This Visit   None Visit Diagnoses       Upper respiratory tract infection, unspecified type    -  Primary   Relevant Medications   azithromycin  (ZITHROMAX ) 250 MG tablet      Acute, ongoing  She reports ongoing URI symptoms comprised of chills, postnasal drainage, fatigue, coughing, nausea, myalgias that are not resolving with OTC regimens  Will send script for Zpack to assist with pulmonary inflammation and to provide Abx coverage  She is likely outside antiviral windows so no testing for COVID or flu today. She also reports multiple negative home tests for COVID Reviewed ED and return precautions. Can continue OTC medications as desired for symptomatic management Follow up as needed for persistent or progressing symptoms    No follow-ups on file.   I, Kmari Halter E Abbygale Lapid, PA-C, have reviewed all documentation for this visit. The documentation on 03/06/23 for the exam, diagnosis, procedures, and orders are all accurate and complete.   Rocky Mt, MHS, PA-C [facility]

## 2023-03-05 NOTE — Patient Instructions (Signed)
 I also recommend adding an antihistamine to your daily regimen This includes medications like Claritin, Allegra, Zyrtec- the generics of these work very well and are usually less expensive I recommend using Flonase nasal spray - 2 puffs twice per day to help with your nasal congestion The antihistamines and Flonase can take a few weeks to provide significant relief from allergy symptoms but should start to provide some benefit soon.  I have sent In a script for a Zpack to help with your symptoms- please take as directed  You can continue to use OTC medications as needed to help with your symptoms

## 2023-03-10 ENCOUNTER — Ambulatory Visit: Payer: Medicaid Other | Admitting: Family Medicine

## 2023-03-23 ENCOUNTER — Ambulatory Visit
Admission: RE | Admit: 2023-03-23 | Discharge: 2023-03-23 | Disposition: A | Discharge status: Home / Self Care | Payer: 59 | Attending: Family Medicine | Admitting: Family Medicine

## 2023-03-23 ENCOUNTER — Ambulatory Visit
Admission: RE | Admit: 2023-03-23 | Discharge: 2023-03-23 | Disposition: A | Discharge status: Home / Self Care | Payer: 59 | Source: Ambulatory Visit | Attending: Family Medicine

## 2023-03-23 ENCOUNTER — Encounter: Payer: Self-pay | Admitting: Family Medicine

## 2023-03-23 ENCOUNTER — Ambulatory Visit: Payer: Medicaid Other | Admitting: Family Medicine

## 2023-03-23 VITALS — BP 106/62 | HR 66 | Temp 97.6°F | Ht 63.0 in | Wt 129.3 lb

## 2023-03-23 DIAGNOSIS — M79672 Pain in left foot: Secondary | ICD-10-CM | POA: Insufficient documentation

## 2023-03-23 DIAGNOSIS — S99922A Unspecified injury of left foot, initial encounter: Secondary | ICD-10-CM | POA: Diagnosis not present

## 2023-03-23 MED ORDER — TRAMADOL HCL 50 MG PO TABS: 50.0000 mg | ORAL_TABLET | Freq: Three times a day (TID) | ORAL | 0 refills | Status: AC | PRN

## 2023-03-23 NOTE — Progress Notes (Signed)
Established patient visit   Patient: Amber Wilson   DOB: 21-Nov-1995   27 y.o. Female  MRN: [id number] Visit Date: 03/23/2023  Today's healthcare provider: Ronnald Ramp, MD   Chief Complaint  Patient presents with   left foot pain    Subjective       Discussed the use of AI scribe software for clinical note transcription with the patient, who gave verbal consent to proceed.  History of Present Illness   The patient, a 28 year old female with a history of physical fitness and pole dancing, presents with a week-long history of foot and leg pain. The pain began after a pole dancing session where she reportedly hit her foot hard against the pole. Initially, she thought she was developing plantar fasciitis, but the pain has since radiated up her leg to her buttocks, leading her to suspect a pinched nerve.  The pain is described as a constant throbbing from the heel to the mid-foot, with radiation up the outside of the heel and into the hip. The pain is severe, rated as a 7 out of 10. The patient has tried over-the-counter anti-inflammatory medications, a foot brace, and massage to alleviate the pain, but these interventions have not provided significant relief.  The patient has continued to work out despite the pain, believing it to be plantar fasciitis. However, the pain has not improved and has, in fact, worsened. The patient denies any other leg pain, but reports a tight sensation where the pain is radiating.  The patient has a high pain threshold, having previously given birth to twins naturally. She has been taking NSAIDs, which have provided some temporary relief. The patient has been prescribed tramadol for additional pain management.         Past Medical History:  Diagnosis Date   Allergy    Anxiety    Depression     Medications: Outpatient Medications Prior to Visit  Medication Sig   azithromycin (ZITHROMAX) 250 MG tablet Take 500mg  PO daily x1d and  then 250mg  daily x4 days (Patient not taking: Reported on 03/23/2023)   clobetasol ointment (TEMOVATE) 0.05 % Apply 1 Application topically 2 (two) times daily. (Patient not taking: Reported on 03/23/2023)   predniSONE (DELTASONE) 50 MG tablet Take 1 tablet (50 mg total) by mouth daily with breakfast. (Patient not taking: Reported on 03/23/2023)   No facility-administered medications prior to visit.    Review of Systems  Last metabolic panel Lab Results  Component Value Date   GLUCOSE 82 07/15/2018   NA 139 07/15/2018   K 3.6 07/15/2018   CL 102 07/15/2018   CO2 21 07/15/2018   BUN 11 07/15/2018   CREATININE 0.71 07/15/2018   GFRNONAA 120 07/15/2018   CALCIUM 9.1 07/15/2018   PROT 7.4 07/15/2018   ALBUMIN 4.7 07/15/2018   LABGLOB 2.7 07/15/2018   AGRATIO 1.7 07/15/2018   BILITOT 0.4 07/15/2018   ALKPHOS 56 07/15/2018   AST 20 07/15/2018   ALT 16 07/15/2018        Objective    BP 106/62 (BP Location: Left Arm, Patient Position: Sitting)   Pulse 66   Temp 97.6 F (36.4 C) (Oral)   Ht 5\' 3"  (1.6 m)   Wt 129 lb 4.8 oz (58.7 kg)   SpO2 98%   BMI 22.90 kg/m  BP Readings from Last 3 Encounters:  03/23/23 106/62  03/05/23 100/66  02/11/23 108/68   Wt Readings from Last 3 Encounters:  03/23/23 129 lb 4.8  oz (58.7 kg)  03/05/23 128 lb 9.6 oz (58.3 kg)  02/11/23 127 lb (57.6 kg)        Physical Exam  Foot: Inspection:  No obvious bony deformity.  No swelling, erythema, or bruising.  Normal arch Palpation: + tenderness to palpation of anterior heel and along plantar heel ROM: Full  ROM of the ankle. Normal midfoot flexibility Strength: 5/5 strength ankle in all planes Neurovascular: N/V intact distally in the lower extremity Special tests: Negative anterior drawer. +squeeze. normal midfoot flexibility. Normal calcaneal motion with heel raise   No results found for any visits on 03/23/23.  Assessment & Plan     Problem List Items Addressed This Visit    None Visit Diagnoses       Left foot pain    -  Primary   Relevant Medications   traMADol (ULTRAM) 50 MG tablet   Other Relevant Orders   DG Foot Complete Left          Left Heel and Leg Pain Presents with throbbing left heel and leg pain radiating to the buttock following a high-impact injury during pole dancing. Differential diagnosis includes fracture, nerve impingement, or severe contusion. NSAIDs and foot brace have provided minimal relief. Physical exam reveals significant tenderness in the heel and calf, pain rated at 7/10. Discussed potential for fracture and nerve irritation. Advised to avoid exercise and dancing. X-ray results will guide further management, including potential podiatry referral. - Order x-ray of the left foot and ankle - Advise wearing hard sole shoes for support - Prescribe tramadol 50mg  every 8-12 hours for pain management - Advise avoiding exercise and dancing until further notice - Refer to podiatry if x-ray results are abnormal or if pain persists despite normal x-ray results  Sciatica? Reports radiating pain from the heel to the buttock, suggesting possible sciatica secondary to nerve impingement or irritation post-injury. Pain exacerbated by physical activity and unresponsive to conservative measures. Further evaluation needed based on x-ray results. - Monitor symptoms and reassess after x-ray results - Consider referral to podiatry for further evaluation if pain persists      Return in about 2 weeks (around 04/06/2023), or if symptoms worsen or fail to improve.         Ronnald Ramp, MD  [facility] [phone] (phone) [phone] (fax)  [facility]

## 2023-03-24 ENCOUNTER — Encounter: Payer: Self-pay | Admitting: Family Medicine

## 2023-04-15 ENCOUNTER — Encounter: Payer: Self-pay | Admitting: Family Medicine

## 2023-04-15 ENCOUNTER — Ambulatory Visit: Payer: Medicaid Other | Admitting: Family Medicine

## 2023-04-15 VITALS — BP 114/60 | HR 64 | Ht 64.0 in | Wt 132.0 lb

## 2023-04-15 DIAGNOSIS — L720 Epidermal cyst: Secondary | ICD-10-CM | POA: Diagnosis not present

## 2023-04-15 DIAGNOSIS — M79672 Pain in left foot: Secondary | ICD-10-CM | POA: Diagnosis not present

## 2023-04-15 DIAGNOSIS — B079 Viral wart, unspecified: Secondary | ICD-10-CM

## 2023-04-15 DIAGNOSIS — D229 Melanocytic nevi, unspecified: Secondary | ICD-10-CM

## 2023-04-15 DIAGNOSIS — H0011 Chalazion right upper eyelid: Secondary | ICD-10-CM | POA: Diagnosis not present

## 2023-04-15 NOTE — Progress Notes (Signed)
Established patient visit   Patient: Amber Wilson   DOB: 1995/08/04   28 y.o. Female  MRN: [id number] Visit Date: 04/15/2023  Today's healthcare provider: Ronnald Ramp, MD   Chief Complaint  Patient presents with   Warts    She warts all over that's she needs checked   Cyst    She has a cyst on her inner R thigh that's been there for about 6 months, it dont bother her, she just dont like the way it looks    Referral    She would like a referral placed for Derm, she had one placed back when Dr Suzie Portela was here, but she has trouble with her insurance at the time, a new one needs to be placed, she has skin tags and moles she would like routinely checked.    Subjective     HPI     Warts    Additional comments: She warts all over that's she needs checked        Cyst    Additional comments: She has a cyst on her inner R thigh that's been there for about 6 months, it dont bother her, she just dont like the way it looks         Referral    Additional comments: She would like a referral placed for Derm, she had one placed back when Dr Suzie Portela was here, but she has trouble with her insurance at the time, a new one needs to be placed, she has skin tags and moles she would like routinely checked.       Last edited by Acey Lav, CMA on 04/15/2023  2:33 PM.       Discussed the use of AI scribe software for clinical note transcription with the patient, who gave verbal consent to proceed.  History of Present Illness   Amber Wilson is a 28 year old female who presents with left foot pain and multiple skin lesions.  She has been experiencing left foot pain for approximately one month. The pain is described as soreness and aching, particularly noticeable after working four-hour shifts, occurring about once a week. There is no known injury to the foot, and she initially thought it resembled plantar fasciitis.  She has a history of multiple skin lesions,  including cysts, warts, and skin tags, which have been present since childhood. She mentions having three or four warts on her body, some of which have resolved over time, such as one on her finger that disappeared after frequent exposure to alcohol during nail treatments. She also has a cyst that was previously evaluated and labeled as a cyst by a previous doctor. Additionally, she has skin tags in her armpit and near the vaginal area, and a mole on her breast that has been monitored since her teenage years. She notes that the mole may have darkened slightly since her last examination. Her hairdresser also pointed out a mole on her head that requires evaluation. She mentions a previous referral to dermatology for these skin issues, which was disrupted due to an insurance change, necessitating a new referral.  She experiences an intermittent sensation of a chalazion on the right upper eyelid. She prefers to avoid steroid injection and is monitoring the condition as it is not as prominent as before.         Past Medical History:  Diagnosis Date   Allergy    Anxiety    Depression     Medications:  Outpatient Medications Prior to Visit  Medication Sig   azithromycin (ZITHROMAX) 250 MG tablet Take 500mg  PO daily x1d and then 250mg  daily x4 days (Patient not taking: Reported on 04/15/2023)   clobetasol ointment (TEMOVATE) 0.05 % Apply 1 Application topically 2 (two) times daily. (Patient not taking: Reported on 04/15/2023)   predniSONE (DELTASONE) 50 MG tablet Take 1 tablet (50 mg total) by mouth daily with breakfast. (Patient not taking: Reported on 04/15/2023)   No facility-administered medications prior to visit.    Review of Systems  Last metabolic panel Lab Results  Component Value Date   GLUCOSE 82 07/15/2018   NA 139 07/15/2018   K 3.6 07/15/2018   CL 102 07/15/2018   CO2 21 07/15/2018   BUN 11 07/15/2018   CREATININE 0.71 07/15/2018   GFRNONAA 120 07/15/2018   CALCIUM 9.1  07/15/2018   PROT 7.4 07/15/2018   ALBUMIN 4.7 07/15/2018   LABGLOB 2.7 07/15/2018   AGRATIO 1.7 07/15/2018   BILITOT 0.4 07/15/2018   ALKPHOS 56 07/15/2018   AST 20 07/15/2018   ALT 16 07/15/2018   Last lipids Lab Results  Component Value Date   CHOL 139 07/15/2018   HDL 49 07/15/2018   LDLCALC 75 07/15/2018   TRIG 76 07/15/2018   CHOLHDL 2.8 07/15/2018   Last hemoglobin A1c No results found for: "HGBA1C"      Objective    BP 114/60 (BP Location: Right Arm, Patient Position: Sitting, Cuff Size: Small)   Pulse 64   Ht 5\' 4"  (1.626 m)   Wt 132 lb (59.9 kg)   SpO2 99%   BMI 22.66 kg/m  BP Readings from Last 3 Encounters:  04/15/23 114/60  03/23/23 106/62  03/05/23 100/66   Wt Readings from Last 3 Encounters:  04/15/23 132 lb (59.9 kg)  03/23/23 129 lb 4.8 oz (58.7 kg)  03/05/23 128 lb 9.6 oz (58.3 kg)        Physical Exam       No results found for any visits on 04/15/23.  Assessment & Plan     Problem List Items Addressed This Visit       Musculoskeletal and Integument   Viral warts   Relevant Orders   Ambulatory referral to Dermatology   Multiple atypical nevi   Relevant Orders   Ambulatory referral to Dermatology   Epidermoid cyst of skin of thigh   Relevant Orders   Ambulatory referral to Dermatology   Chalazion of right upper eyelid   Relevant Orders   Ambulatory referral to Dermatology   Other Visit Diagnoses       Left foot pain    -  Primary   Relevant Orders   Ambulatory referral to Podiatry           Left Foot Pain Intermittent left foot pain, described as soreness and aching, occurring about once a week during four-hour workdays. No history of injury. Symptoms have been present for approximately one month. Differential includes plantar fasciitis. - Refer to podiatry for further evaluation if symptoms persist  Multiple Skin Lesions Multiple skin lesions including warts, skin tags, and moles. One mole on the breast,  monitored since adolescence, appears slightly darker. Suspected epidermoid cyst on the left upper eyelid. Prefers a female dermatologist for evaluation of skin tags near the vaginal area. - Refer to dermatology for comprehensive evaluation - Measure mole on breast to ensure it is not larger than 5mm - Scan and send pictures of lesions to dermatology  Chalazion Persistent  chalazion on the right upper eyelid. Patient declines steroid injection. Lesion is smaller than previously noted. Discussed potential for spontaneous resolution and monitoring for changes. - Monitor chalazion for changes    Return in about 4 months (around 08/24/2023) for CPE.         Ronnald Ramp, MD  [facility] [phone] (phone) [phone] (fax)  [facility]

## 2023-07-09 DIAGNOSIS — R070 Pain in throat: Secondary | ICD-10-CM | POA: Diagnosis not present

## 2023-07-09 DIAGNOSIS — J069 Acute upper respiratory infection, unspecified: Secondary | ICD-10-CM | POA: Diagnosis not present

## 2023-08-25 ENCOUNTER — Encounter: Payer: Medicaid Other | Admitting: Family Medicine

## 2023-09-07 ENCOUNTER — Encounter: Admitting: Family Medicine

## 2023-09-11 ENCOUNTER — Encounter: Payer: Self-pay | Admitting: Family Medicine

## 2023-09-11 ENCOUNTER — Ambulatory Visit (INDEPENDENT_AMBULATORY_CARE_PROVIDER_SITE_OTHER): Admitting: Family Medicine

## 2023-09-11 VITALS — BP 111/61 | HR 63 | Ht 64.0 in | Wt 131.1 lb

## 2023-09-11 DIAGNOSIS — Z13220 Encounter for screening for lipoid disorders: Secondary | ICD-10-CM | POA: Diagnosis not present

## 2023-09-11 DIAGNOSIS — Z0000 Encounter for general adult medical examination without abnormal findings: Secondary | ICD-10-CM | POA: Diagnosis not present

## 2023-09-11 DIAGNOSIS — B079 Viral wart, unspecified: Secondary | ICD-10-CM | POA: Diagnosis not present

## 2023-09-11 DIAGNOSIS — Z131 Encounter for screening for diabetes mellitus: Secondary | ICD-10-CM | POA: Diagnosis not present

## 2023-09-11 DIAGNOSIS — Z130 Encounter for screening for diseases of the blood and blood-forming organs and certain disorders involving the immune mechanism: Secondary | ICD-10-CM | POA: Diagnosis not present

## 2023-09-11 DIAGNOSIS — L720 Epidermal cyst: Secondary | ICD-10-CM

## 2023-09-11 DIAGNOSIS — Z113 Encounter for screening for infections with a predominantly sexual mode of transmission: Secondary | ICD-10-CM

## 2023-09-11 DIAGNOSIS — Z114 Encounter for screening for human immunodeficiency virus [HIV]: Secondary | ICD-10-CM | POA: Diagnosis not present

## 2023-09-11 DIAGNOSIS — D229 Melanocytic nevi, unspecified: Secondary | ICD-10-CM | POA: Diagnosis not present

## 2023-09-11 NOTE — Patient Instructions (Signed)
 It was a pleasure to see you today!  Thank you for choosing [facility] for your primary care.   Today you were seen for your annual physical  Please review the attached information regarding helpful preventive health topics.   To keep you healthy, please keep in mind the following health maintenance items that you are due for:   Health Maintenance Due  Topic Date Due   Hepatitis B Vaccines (1 of 3 - 19+ 3-dose series) Never done   HPV VACCINES (1 - Risk 3-dose SCDM series) Never done   COVID-19 Vaccine (6 - 2024-25 season) 11/02/2022     Best Wishes,   Dr. Lang

## 2023-09-11 NOTE — Progress Notes (Signed)
 Complete physical exam   Patient: Amber Wilson   DOB: 1995-06-21   28 y.o. Female  MRN: [id number] Visit Date: 09/11/2023  Today's healthcare provider: Rockie Agent, MD   Chief Complaint  Patient presents with   Annual Exam    Last completed 08/21/22 Diet -  General, well balanced Exercise - daily 7 days a week  Feeling - well Sleeping - well Concerns -  none   Subjective    Amber Wilson is a 28 y.o. female who presents today for a complete physical exam.    She does not have additional problems to discuss today.   Discussed the use of AI scribe software for clinical note transcription with the patient, who gave verbal consent to proceed.  History of Present Illness Amber Wilson is a 28 year old female who presents for an annual physical exam and seeks a referral to a dermatologist.  She is experiencing several dermatological issues, including warts and skin tags, as well as a cyst on the inside of her thigh. A wart on her knee has been present since childhood, and attempts to remove it have been unsuccessful. She also has a bothersome wart on her heel. She has not previously consulted a dermatologist for these issues. Recently, she has noticed new growths on her thumb and in the bikini area, and she is concerned they may be spreading due to shaving.  She is up to date on most vaccinations but needs COVID, hepatitis B, and HPV vaccines, which are currently unavailable at the clinic. She plans to obtain these vaccines at a pharmacy or when they become available.  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 She maintains a well-balanced diet and exercises regularly.  She wants to schedule a Pap smear and STD screening for peace of mind due to her work environment. No vaginal concerns or symptoms.     Past Medical History:  Diagnosis Date   Allergy    Anxiety     Depression    Past Surgical History:  Procedure Laterality Date   CESAREAN SECTION  04/18/2020   Procedure: CESAREAN SECTION;  Surgeon: Connell Davies, MD;  Location: [facility] ORS;  Service: Obstetrics;;   TONSILLECTOMY     WISDOM TOOTH EXTRACTION     Social History   Socioeconomic History   Marital status: Single    Spouse name: Not on file   Number of children: Not on file   Years of education: Not on file   Highest education level: Not on file  Occupational History   Not on file  Tobacco Use   Smoking status: Former    Current packs/day: 0.00    Types: Cigarettes    Quit date: 02/15/2016    Years since quitting: 7.5   Smokeless tobacco: Never  Vaping Use   Vaping status: Never Used  Substance and Sexual Activity   Alcohol use: Never   Drug use: Not Currently    Frequency: 21.0 times per week    Types: Marijuana    Comment: 3-4 times per day   Sexual activity: Yes    Birth control/protection: OCP  Other Topics Concern   Not on file  Social History Narrative   Not on file   Social Drivers of Health   Financial Resource Strain: Not on file  Food Insecurity: Not on file  Transportation Needs: Not on file  Physical Activity: Not on file  Stress: Not on file  Social Connections: Not on file  Intimate Partner Violence: Not on file   Family Status  Relation Name Status   Mother  Alive   Father  Alive   MGM  Alive   MGF  Alive   PGM  Alive   PGF  Alive  No partnership data on file   Family History  Problem Relation Age of Onset   Healthy Mother    Healthy Father    Healthy Maternal Grandmother    Healthy Maternal Grandfather    Healthy Paternal Grandmother    Healthy Paternal Grandfather    Allergies  Allergen Reactions   Penicillins Anaphylaxis and Rash   Pollen Extract      Medications: Outpatient Medications Prior to Visit  Medication Sig   azithromycin  (ZITHROMAX ) 250 MG tablet Take 500mg  PO daily x1d and then 250mg  daily x4 days (Patient not  taking: Reported on 09/11/2023)   clobetasol  ointment (TEMOVATE ) 0.05 % Apply 1 Application topically 2 (two) times daily. (Patient not taking: Reported on 09/11/2023)   predniSONE  (DELTASONE ) 50 MG tablet Take 1 tablet (50 mg total) by mouth daily with breakfast. (Patient not taking: Reported on 09/11/2023)   No facility-administered medications prior to visit.    Review of Systems  Last metabolic panel Lab Results  Component Value Date   GLUCOSE 82 07/15/2018   NA 139 07/15/2018   K 3.6 07/15/2018   CL 102 07/15/2018   CO2 21 07/15/2018   BUN 11 07/15/2018   CREATININE 0.71 07/15/2018   GFRNONAA 120 07/15/2018   CALCIUM 9.1 07/15/2018   PROT 7.4 07/15/2018   ALBUMIN 4.7 07/15/2018   LABGLOB 2.7 07/15/2018   AGRATIO 1.7 07/15/2018   BILITOT 0.4 07/15/2018   ALKPHOS 56 07/15/2018   AST 20 07/15/2018   ALT 16 07/15/2018   Last lipids Lab Results  Component Value Date   CHOL 139 07/15/2018   HDL 49 07/15/2018   LDLCALC 75 07/15/2018   TRIG 76 07/15/2018   CHOLHDL 2.8 07/15/2018   Last hemoglobin A1c No results found for: HGBA1C Last thyroid functions Lab Results  Component Value Date   TSH 2.960 07/15/2018       Objective    BP 111/61 (BP Location: Right Arm, Patient Position: Sitting, Cuff Size: Normal)   Pulse 63   Ht 5' 4 (1.626 m)   Wt 131 lb 1.6 oz (59.5 kg)   SpO2 98%   BMI 22.50 kg/m  BP Readings from Last 3 Encounters:  09/11/23 111/61  04/15/23 114/60  03/23/23 106/62   Wt Readings from Last 3 Encounters:  09/11/23 131 lb 1.6 oz (59.5 kg)  04/15/23 132 lb (59.9 kg)  03/23/23 129 lb 4.8 oz (58.7 kg)        Physical Exam Vitals reviewed.  Constitutional:      General: She is not in acute distress.    Appearance: Normal appearance. She is not ill-appearing, toxic-appearing or diaphoretic.  HENT:     Head: Normocephalic and atraumatic.     Right Ear: Tympanic membrane and external ear normal. There is no impacted cerumen.     Left Ear:  Tympanic membrane and external ear normal. There is no impacted cerumen.     Nose: Nose normal.     Mouth/Throat:     Pharynx: Oropharynx is clear.  Eyes:     General: No scleral icterus.    Extraocular Movements: Extraocular movements intact.  Conjunctiva/sclera: Conjunctivae normal.     Pupils: Pupils are equal, round, and reactive to light.  Cardiovascular:     Rate and Rhythm: Normal rate and regular rhythm.     Pulses: Normal pulses.     Heart sounds: Normal heart sounds. No murmur heard.    No friction rub. No gallop.  Pulmonary:     Effort: Pulmonary effort is normal. No respiratory distress.     Breath sounds: Normal breath sounds. No wheezing, rhonchi or rales.  Abdominal:     General: Bowel sounds are normal. There is no distension.     Palpations: Abdomen is soft. There is no mass.     Tenderness: There is no abdominal tenderness. There is no guarding.  Musculoskeletal:        General: No deformity.     Cervical back: Normal range of motion and neck supple.     Right lower leg: No edema.     Left lower leg: No edema.  Lymphadenopathy:     Cervical: No cervical adenopathy.  Skin:    General: Skin is warm.     Capillary Refill: Capillary refill takes less than 2 seconds.     Findings: No erythema or rash.  Neurological:     General: No focal deficit present.     Mental Status: She is alert and oriented to person, place, and time.     Cranial Nerves: Cranial nerves 2-12 are intact. No cranial nerve deficit or facial asymmetry.     Motor: Motor function is intact. No weakness.     Gait: Gait normal.  Psychiatric:        Mood and Affect: Mood normal.        Behavior: Behavior normal.       Last depression screening scores    09/11/2023    3:33 PM 04/15/2023    2:34 PM 02/11/2023    3:50 PM  PHQ 2/9 Scores  PHQ - 2 Score 0 0 0  PHQ- 9 Score  0 0    Last fall risk screening    09/11/2023    3:34 PM  Fall Risk   Falls in the past year? 0  Number falls  in past yr: 0  Injury with Fall? 0  Risk for fall due to : No Fall Risks  Follow up Falls evaluation completed    Last Audit-C alcohol use screening    04/12/2020    9:57 AM  Alcohol Use Disorder Test (AUDIT)  1. How often do you have a drink containing alcohol? 1  2. How many drinks containing alcohol do you have on a typical day when you are drinking? 0  3. How often do you have six or more drinks on one occasion? 0  AUDIT-C Score 1   A score of 3 or more in women, and 4 or more in men indicates increased risk for alcohol abuse, EXCEPT if all of the points are from question 1   No results found for any visits on 09/11/23.   Assessment & Plan    Routine Health Maintenance and Physical Exam  Immunization History  Administered Date(s) Administered   Influenza,inj,Quad PF,6+ Mos 02/15/2020   Influenza,inj,quad, With Preservative 01/10/2021   Moderna Covid-19 Fall Seasonal Vaccine 47yrs & older 01/31/2022   Moderna Sars-Covid-2 Vaccination 08/08/2019, 09/12/2019   PPD Test 03/09/2018   Pfizer Covid-19 Vaccine Bivalent Booster 49yrs & up 01/10/2021   Td 07/15/2018   Tdap 02/15/2020    Health Maintenance  Topic  Date Due   Hepatitis B Vaccines (1 of 3 - 19+ 3-dose series) Never done   HPV VACCINES (1 - Risk 3-dose SCDM series) Never done   COVID-19 Vaccine (6 - 2024-25 season) 11/02/2022   INFLUENZA VACCINE  10/02/2023   Cervical Cancer Screening (Pap smear)  11/13/2024   DTaP/Tdap/Td (3 - Td or Tdap) 02/14/2030   Hepatitis C Screening  Completed   HIV Screening  Completed   Meningococcal B Vaccine  Aged Out    Problem List Items Addressed This Visit       Musculoskeletal and Integument   Viral warts   Relevant Orders   Ambulatory referral to Dermatology   Multiple atypical nevi   Relevant Orders   Ambulatory referral to Dermatology   Epidermoid cyst of skin of thigh   Relevant Orders   Ambulatory referral to Dermatology     Other   Annual physical exam -  Primary   Other Visit Diagnoses       Screening for deficiency anemia       Relevant Orders   CBC     Screening for diabetes mellitus       Relevant Orders   Hemoglobin A1c     Screening for lipid disorders       Relevant Orders   Lipid panel        Assessment & Plan Annual Physical Examination Routine annual physical examination for a 28 year old female. No acute concerns. BMI is 22, indicating a healthy range. No smoking or excessive alcohol consumption. Regular exercise and a well-balanced diet are maintained. - Perform physical examination - Order A1c, CBC, and lipid panel - Schedule blood work for Monday - Communicate lab results via MyChart  Cutaneous Lesions Presence of several warts, skin tags, and a cyst on the inside of the thigh. Lesions have been present since childhood, with new ones possibly due to shaving. No prior dermatological treatment. Referral to a dermatologist is needed due to insurance issues with previous referrals. - Provide referral to a dermatologist who accepts Medicaid  Anxiety Anxiety with no specific discussion of current symptoms or treatment adjustments during this visit.  General Health Maintenance Recommendations for COVID-19, hepatitis B, and HPV vaccinations, currently out of stock. Up to date on other vaccinations. Pap smear due next September 2026, previously negative. Requested additional STD screening due to occupational exposure risks. - Recommend COVID-19, hepatitis B, and HPV vaccinations when available - Order HIV and syphilis blood tests       No follow-ups on file.       Rockie Agent, MD  [facility] [phone] (phone) [phone] (fax)  [facility]

## 2023-10-02 ENCOUNTER — Ambulatory Visit: Payer: Self-pay

## 2023-10-02 DIAGNOSIS — H0014 Chalazion left upper eyelid: Secondary | ICD-10-CM | POA: Diagnosis not present

## 2023-10-02 DIAGNOSIS — H0011 Chalazion right upper eyelid: Secondary | ICD-10-CM

## 2023-10-02 NOTE — Telephone Encounter (Signed)
 I recommend that this patient be evaluated by ophthalmology as soon as possible to determine accurate diagnosis and treatment. I do not recommend treatment with the steroid topical cream requested be used around the eye.I would recommend holding off on the oral steroids until she is evaluated. I will send in an urgent ophthalmology referral.

## 2023-10-02 NOTE — Telephone Encounter (Signed)
 FYI Only or Action Required?: Action required by provider: medication refill request.  Patient was last seen in primary care on 09/11/2023 by Sharma Coyer, MD.  Called Nurse Triage reporting Belepharitis.  Symptoms began several days ago.  Interventions attempted: Nothing.  Symptoms are: gradually worsening.  Triage Disposition: See Physician Within 24 Hours  Patient/caregiver understands and will follow disposition?: Yes  Patient seen on 12/11 for same symptoms, treated with prednison and clobetasol  cream. Asking if she can get a new RX. Med pended.   Copied from CRM [id number]. Topic: Clinical - Red Word Triage >> Oct 02, 2023  8:34 AM Delon HERO wrote: Red Word that prompted transfer to Nurse Triage: Patient is calling to report Left eye cyst with redness, vision impairment, swollen at started on Tuesday evening. Reason for Disposition  [1] Eyelid is very swollen AND [2] no fever  Answer Assessment - Initial Assessment Questions 1. LOCATION: Which eye has the sty? Upper or lower eyelid?     Upper eyelid left eye  2. SIZE: How big is it? (Note: standard pencil eraser is 6 mm)    Size of standard pencil eraser  3. EYELID: Is the eyelid swollen? If Yes, ask: How much?     Yes  4. REDNESS: Has the redness spread onto the eyelid?     Yes  5. ONSET: When did you notice the sty?     First noticed some irritation 3 days ago  6. VISION: Do you have blurred vision?      Some blurred vsiion when first waking up  7. PAIN: Is it painful? If Yes, ask: How bad is the pain?  (Scale 1-10; or mild, moderate, severe)     Mild- moderate  8. CONTACTS: Do you wear contacts?     No, wears glasses  9. OTHER SYMPTOMS: Do you have any other symptoms? (e.g., fever)     No  10. PREGNANCY: Is there any chance you are pregnant? When was your last menstrual period?       July 13-17th  Protocols used: Sty-A-AH

## 2023-10-02 NOTE — Telephone Encounter (Signed)
 LMTCB if any questions, advised patient on the message of what Dr. Lang had recommended.  Okay for E2C2 to relay if patient calls back.

## 2023-11-10 DIAGNOSIS — J029 Acute pharyngitis, unspecified: Secondary | ICD-10-CM | POA: Diagnosis not present

## 2023-11-10 DIAGNOSIS — R0981 Nasal congestion: Secondary | ICD-10-CM | POA: Diagnosis not present

## 2023-11-10 DIAGNOSIS — R059 Cough, unspecified: Secondary | ICD-10-CM | POA: Diagnosis not present

## 2023-12-25 DIAGNOSIS — H00023 Hordeolum internum right eye, unspecified eyelid: Secondary | ICD-10-CM | POA: Diagnosis not present

## 2024-01-13 DIAGNOSIS — J029 Acute pharyngitis, unspecified: Secondary | ICD-10-CM | POA: Diagnosis not present

## 2024-01-13 DIAGNOSIS — B9689 Other specified bacterial agents as the cause of diseases classified elsewhere: Secondary | ICD-10-CM | POA: Diagnosis not present

## 2024-01-13 DIAGNOSIS — R0602 Shortness of breath: Secondary | ICD-10-CM | POA: Diagnosis not present

## 2024-01-13 DIAGNOSIS — R0981 Nasal congestion: Secondary | ICD-10-CM | POA: Diagnosis not present

## 2024-01-13 DIAGNOSIS — J208 Acute bronchitis due to other specified organisms: Secondary | ICD-10-CM | POA: Diagnosis not present

## 2024-05-04 ENCOUNTER — Ambulatory Visit: Admitting: Family Medicine
